# Patient Record
Sex: Female | Born: 1945 | Race: White | Hispanic: No | Smoking: Former smoker
Health system: Southern US, Community
[De-identification: ages and names within clinical notes are randomized; demographics above are authoritative.]

## PROBLEM LIST (undated history)

## (undated) DIAGNOSIS — E041 Nontoxic single thyroid nodule: Secondary | ICD-10-CM

## (undated) DIAGNOSIS — M3500 Sicca syndrome, unspecified: Secondary | ICD-10-CM

## (undated) DIAGNOSIS — Z8619 Personal history of other infectious and parasitic diseases: Secondary | ICD-10-CM

## (undated) DIAGNOSIS — H409 Unspecified glaucoma: Secondary | ICD-10-CM

## (undated) DIAGNOSIS — M5430 Sciatica, unspecified side: Secondary | ICD-10-CM

## (undated) DIAGNOSIS — K449 Diaphragmatic hernia without obstruction or gangrene: Secondary | ICD-10-CM

## (undated) DIAGNOSIS — C029 Malignant neoplasm of tongue, unspecified: Secondary | ICD-10-CM

## (undated) DIAGNOSIS — K219 Gastro-esophageal reflux disease without esophagitis: Secondary | ICD-10-CM

## (undated) DIAGNOSIS — Z8709 Personal history of other diseases of the respiratory system: Secondary | ICD-10-CM

## (undated) DIAGNOSIS — F319 Bipolar disorder, unspecified: Secondary | ICD-10-CM

## (undated) DIAGNOSIS — M1990 Unspecified osteoarthritis, unspecified site: Secondary | ICD-10-CM

## (undated) DIAGNOSIS — M47812 Spondylosis without myelopathy or radiculopathy, cervical region: Secondary | ICD-10-CM

## (undated) DIAGNOSIS — R209 Unspecified disturbances of skin sensation: Secondary | ICD-10-CM

## (undated) DIAGNOSIS — H903 Sensorineural hearing loss, bilateral: Secondary | ICD-10-CM

## (undated) HISTORY — DX: Sensorineural hearing loss, bilateral: H90.3

## (undated) HISTORY — DX: Spondylosis without myelopathy or radiculopathy, cervical region: M47.812

## (undated) HISTORY — DX: Malignant neoplasm of tongue, unspecified: C02.9

## (undated) HISTORY — DX: Unspecified disturbances of skin sensation: R20.9

## (undated) HISTORY — PX: CATARACT EXTRACTION: SUR2

## (undated) HISTORY — DX: Unspecified osteoarthritis, unspecified site: M19.90

## (undated) HISTORY — DX: Personal history of other infectious and parasitic diseases: Z86.19

## (undated) HISTORY — DX: Diaphragmatic hernia without obstruction or gangrene: K44.9

## (undated) HISTORY — PX: OTHER SURGICAL HISTORY: SHX169

## (undated) HISTORY — PX: CHOLECYSTECTOMY: SHX55

## (undated) HISTORY — PX: ABDOMINAL HYSTERECTOMY: SUR658

## (undated) HISTORY — DX: Sicca syndrome, unspecified: M35.00

## (undated) HISTORY — DX: Personal history of other diseases of the respiratory system: Z87.09

## (undated) HISTORY — DX: Unspecified glaucoma: H40.9

## (undated) HISTORY — DX: Sciatica, unspecified side: M54.30

## (undated) HISTORY — PX: BREAST BIOPSY: SHX20

## (undated) HISTORY — DX: Nontoxic single thyroid nodule: E04.1

## (undated) HISTORY — DX: Bipolar disorder, unspecified: F31.9

## (undated) HISTORY — DX: Gastro-esophageal reflux disease without esophagitis: K21.9

---
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
## 1979-10-29 HISTORY — PX: TUBAL LIGATION: SHX77

## 2007-09-02 DIAGNOSIS — M3500 Sicca syndrome, unspecified: Secondary | ICD-10-CM

## 2007-09-02 HISTORY — DX: Sjogren syndrome, unspecified: M35.00

## 2011-08-15 HISTORY — PX: KNEE SURGERY: SHX244

## 2011-10-30 DIAGNOSIS — F331 Major depressive disorder, recurrent, moderate: Secondary | ICD-10-CM | POA: Diagnosis not present

## 2011-11-19 DIAGNOSIS — R0982 Postnasal drip: Secondary | ICD-10-CM | POA: Diagnosis not present

## 2011-11-19 DIAGNOSIS — R0602 Shortness of breath: Secondary | ICD-10-CM | POA: Diagnosis not present

## 2011-11-19 DIAGNOSIS — G4733 Obstructive sleep apnea (adult) (pediatric): Secondary | ICD-10-CM | POA: Diagnosis not present

## 2011-11-26 DIAGNOSIS — M25569 Pain in unspecified knee: Secondary | ICD-10-CM | POA: Diagnosis not present

## 2011-11-27 DIAGNOSIS — F331 Major depressive disorder, recurrent, moderate: Secondary | ICD-10-CM | POA: Diagnosis not present

## 2011-12-04 DIAGNOSIS — G4733 Obstructive sleep apnea (adult) (pediatric): Secondary | ICD-10-CM | POA: Diagnosis not present

## 2011-12-25 DIAGNOSIS — F331 Major depressive disorder, recurrent, moderate: Secondary | ICD-10-CM | POA: Diagnosis not present

## 2012-01-15 DIAGNOSIS — H26499 Other secondary cataract, unspecified eye: Secondary | ICD-10-CM | POA: Diagnosis not present

## 2012-01-15 DIAGNOSIS — Z961 Presence of intraocular lens: Secondary | ICD-10-CM | POA: Diagnosis not present

## 2012-01-15 DIAGNOSIS — H4011X0 Primary open-angle glaucoma, stage unspecified: Secondary | ICD-10-CM | POA: Diagnosis not present

## 2012-01-15 DIAGNOSIS — H47239 Glaucomatous optic atrophy, unspecified eye: Secondary | ICD-10-CM | POA: Diagnosis not present

## 2012-01-21 DIAGNOSIS — G4733 Obstructive sleep apnea (adult) (pediatric): Secondary | ICD-10-CM | POA: Diagnosis not present

## 2012-01-21 DIAGNOSIS — J449 Chronic obstructive pulmonary disease, unspecified: Secondary | ICD-10-CM | POA: Diagnosis not present

## 2012-01-21 DIAGNOSIS — R0602 Shortness of breath: Secondary | ICD-10-CM | POA: Diagnosis not present

## 2012-01-22 DIAGNOSIS — F331 Major depressive disorder, recurrent, moderate: Secondary | ICD-10-CM | POA: Diagnosis not present

## 2012-01-31 DIAGNOSIS — M810 Age-related osteoporosis without current pathological fracture: Secondary | ICD-10-CM | POA: Diagnosis not present

## 2012-01-31 DIAGNOSIS — Z1231 Encounter for screening mammogram for malignant neoplasm of breast: Secondary | ICD-10-CM | POA: Diagnosis not present

## 2012-01-31 DIAGNOSIS — Z0000 Encounter for general adult medical examination without abnormal findings: Secondary | ICD-10-CM | POA: Diagnosis not present

## 2012-02-19 DIAGNOSIS — G4733 Obstructive sleep apnea (adult) (pediatric): Secondary | ICD-10-CM | POA: Diagnosis not present

## 2012-02-19 DIAGNOSIS — R0602 Shortness of breath: Secondary | ICD-10-CM | POA: Diagnosis not present

## 2012-02-19 DIAGNOSIS — F331 Major depressive disorder, recurrent, moderate: Secondary | ICD-10-CM | POA: Diagnosis not present

## 2012-02-19 DIAGNOSIS — J449 Chronic obstructive pulmonary disease, unspecified: Secondary | ICD-10-CM | POA: Diagnosis not present

## 2012-03-17 DIAGNOSIS — F331 Major depressive disorder, recurrent, moderate: Secondary | ICD-10-CM | POA: Diagnosis not present

## 2012-05-26 DIAGNOSIS — F331 Major depressive disorder, recurrent, moderate: Secondary | ICD-10-CM | POA: Diagnosis not present

## 2012-05-26 DIAGNOSIS — R0602 Shortness of breath: Secondary | ICD-10-CM | POA: Diagnosis not present

## 2012-05-26 DIAGNOSIS — G4733 Obstructive sleep apnea (adult) (pediatric): Secondary | ICD-10-CM | POA: Diagnosis not present

## 2012-05-26 DIAGNOSIS — J449 Chronic obstructive pulmonary disease, unspecified: Secondary | ICD-10-CM | POA: Diagnosis not present

## 2012-06-03 DIAGNOSIS — J449 Chronic obstructive pulmonary disease, unspecified: Secondary | ICD-10-CM | POA: Diagnosis not present

## 2012-06-03 DIAGNOSIS — M65849 Other synovitis and tenosynovitis, unspecified hand: Secondary | ICD-10-CM | POA: Diagnosis not present

## 2012-06-03 DIAGNOSIS — M65839 Other synovitis and tenosynovitis, unspecified forearm: Secondary | ICD-10-CM | POA: Diagnosis not present

## 2012-06-03 DIAGNOSIS — M159 Polyosteoarthritis, unspecified: Secondary | ICD-10-CM | POA: Diagnosis not present

## 2012-06-03 DIAGNOSIS — G4733 Obstructive sleep apnea (adult) (pediatric): Secondary | ICD-10-CM | POA: Diagnosis not present

## 2012-06-19 DIAGNOSIS — M79609 Pain in unspecified limb: Secondary | ICD-10-CM | POA: Diagnosis not present

## 2012-06-19 DIAGNOSIS — M25579 Pain in unspecified ankle and joints of unspecified foot: Secondary | ICD-10-CM | POA: Diagnosis not present

## 2012-06-22 DIAGNOSIS — M79609 Pain in unspecified limb: Secondary | ICD-10-CM | POA: Diagnosis not present

## 2012-06-23 DIAGNOSIS — F331 Major depressive disorder, recurrent, moderate: Secondary | ICD-10-CM | POA: Diagnosis not present

## 2012-06-26 DIAGNOSIS — M79609 Pain in unspecified limb: Secondary | ICD-10-CM | POA: Diagnosis not present

## 2012-06-26 DIAGNOSIS — S92309A Fracture of unspecified metatarsal bone(s), unspecified foot, initial encounter for closed fracture: Secondary | ICD-10-CM | POA: Diagnosis not present

## 2012-06-26 DIAGNOSIS — M25579 Pain in unspecified ankle and joints of unspecified foot: Secondary | ICD-10-CM | POA: Diagnosis not present

## 2012-07-06 DIAGNOSIS — F319 Bipolar disorder, unspecified: Secondary | ICD-10-CM | POA: Diagnosis not present

## 2012-07-09 DIAGNOSIS — F319 Bipolar disorder, unspecified: Secondary | ICD-10-CM | POA: Diagnosis not present

## 2012-07-13 DIAGNOSIS — F319 Bipolar disorder, unspecified: Secondary | ICD-10-CM | POA: Diagnosis not present

## 2012-07-16 DIAGNOSIS — H47239 Glaucomatous optic atrophy, unspecified eye: Secondary | ICD-10-CM | POA: Diagnosis not present

## 2012-07-16 DIAGNOSIS — H40019 Open angle with borderline findings, low risk, unspecified eye: Secondary | ICD-10-CM | POA: Diagnosis not present

## 2012-07-16 DIAGNOSIS — Z961 Presence of intraocular lens: Secondary | ICD-10-CM | POA: Diagnosis not present

## 2012-07-24 DIAGNOSIS — F319 Bipolar disorder, unspecified: Secondary | ICD-10-CM | POA: Diagnosis not present

## 2012-07-27 DIAGNOSIS — F331 Major depressive disorder, recurrent, moderate: Secondary | ICD-10-CM | POA: Diagnosis not present

## 2012-07-28 DIAGNOSIS — M25579 Pain in unspecified ankle and joints of unspecified foot: Secondary | ICD-10-CM | POA: Diagnosis not present

## 2012-07-28 DIAGNOSIS — M79609 Pain in unspecified limb: Secondary | ICD-10-CM | POA: Diagnosis not present

## 2012-07-28 DIAGNOSIS — S92309A Fracture of unspecified metatarsal bone(s), unspecified foot, initial encounter for closed fracture: Secondary | ICD-10-CM | POA: Diagnosis not present

## 2012-08-04 DIAGNOSIS — F319 Bipolar disorder, unspecified: Secondary | ICD-10-CM | POA: Diagnosis not present

## 2012-08-04 DIAGNOSIS — M940 Chondrocostal junction syndrome [Tietze]: Secondary | ICD-10-CM | POA: Diagnosis not present

## 2012-08-18 DIAGNOSIS — F319 Bipolar disorder, unspecified: Secondary | ICD-10-CM | POA: Diagnosis not present

## 2012-08-24 DIAGNOSIS — F331 Major depressive disorder, recurrent, moderate: Secondary | ICD-10-CM | POA: Diagnosis not present

## 2012-08-26 DIAGNOSIS — Z961 Presence of intraocular lens: Secondary | ICD-10-CM | POA: Diagnosis not present

## 2012-08-26 DIAGNOSIS — H47239 Glaucomatous optic atrophy, unspecified eye: Secondary | ICD-10-CM | POA: Diagnosis not present

## 2012-08-26 DIAGNOSIS — H40019 Open angle with borderline findings, low risk, unspecified eye: Secondary | ICD-10-CM | POA: Diagnosis not present

## 2012-09-04 DIAGNOSIS — F319 Bipolar disorder, unspecified: Secondary | ICD-10-CM | POA: Diagnosis not present

## 2012-09-13 DIAGNOSIS — M545 Low back pain, unspecified: Secondary | ICD-10-CM | POA: Diagnosis not present

## 2012-09-13 DIAGNOSIS — X500XXA Overexertion from strenuous movement or load, initial encounter: Secondary | ICD-10-CM | POA: Diagnosis not present

## 2012-09-13 DIAGNOSIS — F17200 Nicotine dependence, unspecified, uncomplicated: Secondary | ICD-10-CM | POA: Diagnosis not present

## 2012-09-13 DIAGNOSIS — S339XXA Sprain of unspecified parts of lumbar spine and pelvis, initial encounter: Secondary | ICD-10-CM | POA: Diagnosis not present

## 2012-09-13 DIAGNOSIS — Y92009 Unspecified place in unspecified non-institutional (private) residence as the place of occurrence of the external cause: Secondary | ICD-10-CM | POA: Diagnosis not present

## 2012-09-13 DIAGNOSIS — M549 Dorsalgia, unspecified: Secondary | ICD-10-CM | POA: Diagnosis not present

## 2012-09-13 DIAGNOSIS — M1990 Unspecified osteoarthritis, unspecified site: Secondary | ICD-10-CM | POA: Diagnosis not present

## 2012-09-14 DIAGNOSIS — M545 Low back pain, unspecified: Secondary | ICD-10-CM | POA: Diagnosis not present

## 2012-09-14 DIAGNOSIS — M5430 Sciatica, unspecified side: Secondary | ICD-10-CM | POA: Diagnosis not present

## 2012-09-18 DIAGNOSIS — F331 Major depressive disorder, recurrent, moderate: Secondary | ICD-10-CM | POA: Diagnosis not present

## 2012-09-30 DIAGNOSIS — F319 Bipolar disorder, unspecified: Secondary | ICD-10-CM | POA: Diagnosis not present

## 2012-10-07 DIAGNOSIS — M545 Low back pain, unspecified: Secondary | ICD-10-CM | POA: Diagnosis not present

## 2012-10-07 DIAGNOSIS — F319 Bipolar disorder, unspecified: Secondary | ICD-10-CM | POA: Diagnosis not present

## 2012-10-07 DIAGNOSIS — M5430 Sciatica, unspecified side: Secondary | ICD-10-CM | POA: Diagnosis not present

## 2012-10-28 DIAGNOSIS — C029 Malignant neoplasm of tongue, unspecified: Secondary | ICD-10-CM

## 2012-10-28 HISTORY — DX: Malignant neoplasm of tongue, unspecified: C02.9

## 2012-11-04 DIAGNOSIS — G4762 Sleep related leg cramps: Secondary | ICD-10-CM | POA: Diagnosis not present

## 2012-11-09 DIAGNOSIS — G4733 Obstructive sleep apnea (adult) (pediatric): Secondary | ICD-10-CM | POA: Diagnosis not present

## 2012-11-09 DIAGNOSIS — J449 Chronic obstructive pulmonary disease, unspecified: Secondary | ICD-10-CM | POA: Diagnosis not present

## 2012-11-09 DIAGNOSIS — R0602 Shortness of breath: Secondary | ICD-10-CM | POA: Diagnosis not present

## 2012-11-10 DIAGNOSIS — F319 Bipolar disorder, unspecified: Secondary | ICD-10-CM | POA: Diagnosis not present

## 2012-11-12 DIAGNOSIS — F331 Major depressive disorder, recurrent, moderate: Secondary | ICD-10-CM | POA: Diagnosis not present

## 2012-11-25 DIAGNOSIS — L299 Pruritus, unspecified: Secondary | ICD-10-CM | POA: Diagnosis not present

## 2012-11-25 DIAGNOSIS — L820 Inflamed seborrheic keratosis: Secondary | ICD-10-CM | POA: Diagnosis not present

## 2012-12-15 DIAGNOSIS — F319 Bipolar disorder, unspecified: Secondary | ICD-10-CM | POA: Diagnosis not present

## 2012-12-22 DIAGNOSIS — H903 Sensorineural hearing loss, bilateral: Secondary | ICD-10-CM | POA: Diagnosis not present

## 2012-12-22 DIAGNOSIS — J301 Allergic rhinitis due to pollen: Secondary | ICD-10-CM | POA: Diagnosis not present

## 2012-12-22 DIAGNOSIS — R51 Headache: Secondary | ICD-10-CM | POA: Diagnosis not present

## 2012-12-22 DIAGNOSIS — K2100 Gastro-esophageal reflux disease with esophagitis, without bleeding: Secondary | ICD-10-CM | POA: Diagnosis not present

## 2012-12-22 DIAGNOSIS — M542 Cervicalgia: Secondary | ICD-10-CM | POA: Diagnosis not present

## 2012-12-22 DIAGNOSIS — J329 Chronic sinusitis, unspecified: Secondary | ICD-10-CM | POA: Diagnosis not present

## 2012-12-22 DIAGNOSIS — J3489 Other specified disorders of nose and nasal sinuses: Secondary | ICD-10-CM | POA: Diagnosis not present

## 2012-12-31 DIAGNOSIS — Z961 Presence of intraocular lens: Secondary | ICD-10-CM | POA: Diagnosis not present

## 2012-12-31 DIAGNOSIS — H40019 Open angle with borderline findings, low risk, unspecified eye: Secondary | ICD-10-CM | POA: Diagnosis not present

## 2012-12-31 DIAGNOSIS — H47239 Glaucomatous optic atrophy, unspecified eye: Secondary | ICD-10-CM | POA: Diagnosis not present

## 2012-12-31 DIAGNOSIS — M316 Other giant cell arteritis: Secondary | ICD-10-CM | POA: Diagnosis not present

## 2013-01-05 DIAGNOSIS — H903 Sensorineural hearing loss, bilateral: Secondary | ICD-10-CM | POA: Diagnosis not present

## 2013-01-06 DIAGNOSIS — F331 Major depressive disorder, recurrent, moderate: Secondary | ICD-10-CM | POA: Diagnosis not present

## 2013-01-08 DIAGNOSIS — G44229 Chronic tension-type headache, not intractable: Secondary | ICD-10-CM | POA: Diagnosis not present

## 2013-01-08 DIAGNOSIS — R0989 Other specified symptoms and signs involving the circulatory and respiratory systems: Secondary | ICD-10-CM | POA: Diagnosis not present

## 2013-01-08 DIAGNOSIS — H9209 Otalgia, unspecified ear: Secondary | ICD-10-CM | POA: Diagnosis not present

## 2013-01-14 DIAGNOSIS — F411 Generalized anxiety disorder: Secondary | ICD-10-CM | POA: Diagnosis not present

## 2013-01-15 DIAGNOSIS — F319 Bipolar disorder, unspecified: Secondary | ICD-10-CM | POA: Diagnosis not present

## 2013-01-19 DIAGNOSIS — J339 Nasal polyp, unspecified: Secondary | ICD-10-CM | POA: Diagnosis not present

## 2013-01-19 DIAGNOSIS — R51 Headache: Secondary | ICD-10-CM | POA: Diagnosis not present

## 2013-01-19 DIAGNOSIS — M542 Cervicalgia: Secondary | ICD-10-CM | POA: Diagnosis not present

## 2013-01-19 DIAGNOSIS — K2100 Gastro-esophageal reflux disease with esophagitis, without bleeding: Secondary | ICD-10-CM | POA: Diagnosis not present

## 2013-01-19 DIAGNOSIS — J301 Allergic rhinitis due to pollen: Secondary | ICD-10-CM | POA: Diagnosis not present

## 2013-01-21 DIAGNOSIS — M5412 Radiculopathy, cervical region: Secondary | ICD-10-CM | POA: Diagnosis not present

## 2013-01-21 DIAGNOSIS — R42 Dizziness and giddiness: Secondary | ICD-10-CM | POA: Diagnosis not present

## 2013-01-22 DIAGNOSIS — Z01810 Encounter for preprocedural cardiovascular examination: Secondary | ICD-10-CM | POA: Diagnosis not present

## 2013-01-22 DIAGNOSIS — Z01818 Encounter for other preprocedural examination: Secondary | ICD-10-CM | POA: Diagnosis not present

## 2013-01-25 DIAGNOSIS — Z01811 Encounter for preprocedural respiratory examination: Secondary | ICD-10-CM | POA: Diagnosis not present

## 2013-01-25 DIAGNOSIS — Z01810 Encounter for preprocedural cardiovascular examination: Secondary | ICD-10-CM | POA: Diagnosis not present

## 2013-01-25 DIAGNOSIS — R42 Dizziness and giddiness: Secondary | ICD-10-CM | POA: Diagnosis not present

## 2013-01-25 DIAGNOSIS — R51 Headache: Secondary | ICD-10-CM | POA: Diagnosis not present

## 2013-01-27 DIAGNOSIS — Z01810 Encounter for preprocedural cardiovascular examination: Secondary | ICD-10-CM | POA: Diagnosis not present

## 2013-01-27 DIAGNOSIS — R9431 Abnormal electrocardiogram [ECG] [EKG]: Secondary | ICD-10-CM | POA: Diagnosis not present

## 2013-01-27 DIAGNOSIS — J449 Chronic obstructive pulmonary disease, unspecified: Secondary | ICD-10-CM | POA: Diagnosis not present

## 2013-01-28 DIAGNOSIS — D140 Benign neoplasm of middle ear, nasal cavity and accessory sinuses: Secondary | ICD-10-CM | POA: Diagnosis not present

## 2013-01-28 DIAGNOSIS — G4730 Sleep apnea, unspecified: Secondary | ICD-10-CM | POA: Diagnosis not present

## 2013-01-28 DIAGNOSIS — C01 Malignant neoplasm of base of tongue: Secondary | ICD-10-CM | POA: Diagnosis not present

## 2013-01-28 DIAGNOSIS — J309 Allergic rhinitis, unspecified: Secondary | ICD-10-CM | POA: Diagnosis not present

## 2013-01-28 DIAGNOSIS — D3701 Neoplasm of uncertain behavior of lip: Secondary | ICD-10-CM | POA: Diagnosis not present

## 2013-01-28 DIAGNOSIS — F319 Bipolar disorder, unspecified: Secondary | ICD-10-CM | POA: Diagnosis not present

## 2013-01-28 DIAGNOSIS — D386 Neoplasm of uncertain behavior of respiratory organ, unspecified: Secondary | ICD-10-CM | POA: Diagnosis not present

## 2013-01-28 DIAGNOSIS — F17200 Nicotine dependence, unspecified, uncomplicated: Secondary | ICD-10-CM | POA: Diagnosis not present

## 2013-01-28 DIAGNOSIS — J449 Chronic obstructive pulmonary disease, unspecified: Secondary | ICD-10-CM | POA: Diagnosis not present

## 2013-01-28 DIAGNOSIS — M1990 Unspecified osteoarthritis, unspecified site: Secondary | ICD-10-CM | POA: Diagnosis not present

## 2013-01-28 DIAGNOSIS — C029 Malignant neoplasm of tongue, unspecified: Secondary | ICD-10-CM | POA: Diagnosis not present

## 2013-01-28 DIAGNOSIS — K219 Gastro-esophageal reflux disease without esophagitis: Secondary | ICD-10-CM | POA: Diagnosis not present

## 2013-01-28 DIAGNOSIS — E041 Nontoxic single thyroid nodule: Secondary | ICD-10-CM | POA: Diagnosis not present

## 2013-01-28 DIAGNOSIS — Z79899 Other long term (current) drug therapy: Secondary | ICD-10-CM | POA: Diagnosis not present

## 2013-01-28 DIAGNOSIS — D3709 Neoplasm of uncertain behavior of other specified sites of the oral cavity: Secondary | ICD-10-CM | POA: Diagnosis not present

## 2013-01-28 DIAGNOSIS — J339 Nasal polyp, unspecified: Secondary | ICD-10-CM | POA: Diagnosis not present

## 2013-02-01 DIAGNOSIS — M5412 Radiculopathy, cervical region: Secondary | ICD-10-CM | POA: Diagnosis not present

## 2013-02-02 DIAGNOSIS — C029 Malignant neoplasm of tongue, unspecified: Secondary | ICD-10-CM

## 2013-02-02 HISTORY — DX: Malignant neoplasm of tongue, unspecified: C02.9

## 2013-02-04 DIAGNOSIS — C01 Malignant neoplasm of base of tongue: Secondary | ICD-10-CM | POA: Diagnosis not present

## 2013-02-05 DIAGNOSIS — K2100 Gastro-esophageal reflux disease with esophagitis, without bleeding: Secondary | ICD-10-CM | POA: Diagnosis not present

## 2013-02-05 DIAGNOSIS — R51 Headache: Secondary | ICD-10-CM | POA: Diagnosis not present

## 2013-02-05 DIAGNOSIS — E041 Nontoxic single thyroid nodule: Secondary | ICD-10-CM | POA: Diagnosis not present

## 2013-02-05 DIAGNOSIS — J339 Nasal polyp, unspecified: Secondary | ICD-10-CM | POA: Diagnosis not present

## 2013-02-05 DIAGNOSIS — J301 Allergic rhinitis due to pollen: Secondary | ICD-10-CM | POA: Diagnosis not present

## 2013-02-05 DIAGNOSIS — C01 Malignant neoplasm of base of tongue: Secondary | ICD-10-CM | POA: Diagnosis not present

## 2013-02-05 DIAGNOSIS — M542 Cervicalgia: Secondary | ICD-10-CM | POA: Diagnosis not present

## 2013-02-11 DIAGNOSIS — E041 Nontoxic single thyroid nodule: Secondary | ICD-10-CM | POA: Diagnosis not present

## 2013-02-11 DIAGNOSIS — D34 Benign neoplasm of thyroid gland: Secondary | ICD-10-CM | POA: Diagnosis not present

## 2013-02-11 DIAGNOSIS — E042 Nontoxic multinodular goiter: Secondary | ICD-10-CM | POA: Diagnosis not present

## 2013-02-11 DIAGNOSIS — C029 Malignant neoplasm of tongue, unspecified: Secondary | ICD-10-CM | POA: Diagnosis not present

## 2013-02-17 DIAGNOSIS — Z01818 Encounter for other preprocedural examination: Secondary | ICD-10-CM | POA: Diagnosis not present

## 2013-02-17 DIAGNOSIS — C01 Malignant neoplasm of base of tongue: Secondary | ICD-10-CM | POA: Diagnosis not present

## 2013-02-18 DIAGNOSIS — C01 Malignant neoplasm of base of tongue: Secondary | ICD-10-CM | POA: Diagnosis not present

## 2013-02-22 DIAGNOSIS — C01 Malignant neoplasm of base of tongue: Secondary | ICD-10-CM | POA: Diagnosis not present

## 2013-02-22 DIAGNOSIS — R05 Cough: Secondary | ICD-10-CM | POA: Diagnosis not present

## 2013-02-22 DIAGNOSIS — R059 Cough, unspecified: Secondary | ICD-10-CM | POA: Diagnosis not present

## 2013-02-22 DIAGNOSIS — R1312 Dysphagia, oropharyngeal phase: Secondary | ICD-10-CM | POA: Diagnosis not present

## 2013-02-22 DIAGNOSIS — B977 Papillomavirus as the cause of diseases classified elsewhere: Secondary | ICD-10-CM | POA: Diagnosis not present

## 2013-02-25 DIAGNOSIS — C01 Malignant neoplasm of base of tongue: Secondary | ICD-10-CM | POA: Diagnosis not present

## 2013-03-03 DIAGNOSIS — E042 Nontoxic multinodular goiter: Secondary | ICD-10-CM | POA: Diagnosis not present

## 2013-03-03 DIAGNOSIS — D140 Benign neoplasm of middle ear, nasal cavity and accessory sinuses: Secondary | ICD-10-CM | POA: Diagnosis not present

## 2013-03-03 DIAGNOSIS — B977 Papillomavirus as the cause of diseases classified elsewhere: Secondary | ICD-10-CM | POA: Diagnosis not present

## 2013-03-03 DIAGNOSIS — C01 Malignant neoplasm of base of tongue: Secondary | ICD-10-CM | POA: Diagnosis not present

## 2013-03-04 DIAGNOSIS — M7989 Other specified soft tissue disorders: Secondary | ICD-10-CM | POA: Diagnosis not present

## 2013-03-04 DIAGNOSIS — C01 Malignant neoplasm of base of tongue: Secondary | ICD-10-CM | POA: Diagnosis not present

## 2013-03-10 DIAGNOSIS — C01 Malignant neoplasm of base of tongue: Secondary | ICD-10-CM | POA: Diagnosis not present

## 2013-03-15 DIAGNOSIS — C01 Malignant neoplasm of base of tongue: Secondary | ICD-10-CM | POA: Diagnosis not present

## 2013-03-15 DIAGNOSIS — R1312 Dysphagia, oropharyngeal phase: Secondary | ICD-10-CM | POA: Diagnosis not present

## 2013-03-15 DIAGNOSIS — Z85819 Personal history of malignant neoplasm of unspecified site of lip, oral cavity, and pharynx: Secondary | ICD-10-CM | POA: Diagnosis not present

## 2013-03-18 DIAGNOSIS — C01 Malignant neoplasm of base of tongue: Secondary | ICD-10-CM | POA: Diagnosis not present

## 2013-03-24 DIAGNOSIS — C01 Malignant neoplasm of base of tongue: Secondary | ICD-10-CM | POA: Diagnosis not present

## 2013-03-25 DIAGNOSIS — C01 Malignant neoplasm of base of tongue: Secondary | ICD-10-CM | POA: Diagnosis not present

## 2013-03-25 DIAGNOSIS — R112 Nausea with vomiting, unspecified: Secondary | ICD-10-CM | POA: Diagnosis not present

## 2013-03-26 DIAGNOSIS — C01 Malignant neoplasm of base of tongue: Secondary | ICD-10-CM | POA: Diagnosis not present

## 2013-03-29 DIAGNOSIS — C01 Malignant neoplasm of base of tongue: Secondary | ICD-10-CM | POA: Diagnosis not present

## 2013-03-30 DIAGNOSIS — C01 Malignant neoplasm of base of tongue: Secondary | ICD-10-CM | POA: Diagnosis not present

## 2013-03-31 DIAGNOSIS — C01 Malignant neoplasm of base of tongue: Secondary | ICD-10-CM | POA: Diagnosis not present

## 2013-04-01 DIAGNOSIS — C760 Malignant neoplasm of head, face and neck: Secondary | ICD-10-CM | POA: Diagnosis not present

## 2013-04-01 DIAGNOSIS — C01 Malignant neoplasm of base of tongue: Secondary | ICD-10-CM | POA: Diagnosis not present

## 2013-04-02 DIAGNOSIS — C01 Malignant neoplasm of base of tongue: Secondary | ICD-10-CM | POA: Diagnosis not present

## 2013-04-05 DIAGNOSIS — C01 Malignant neoplasm of base of tongue: Secondary | ICD-10-CM | POA: Diagnosis not present

## 2013-04-06 DIAGNOSIS — C01 Malignant neoplasm of base of tongue: Secondary | ICD-10-CM | POA: Diagnosis not present

## 2013-04-07 DIAGNOSIS — C01 Malignant neoplasm of base of tongue: Secondary | ICD-10-CM | POA: Diagnosis not present

## 2013-04-08 DIAGNOSIS — C01 Malignant neoplasm of base of tongue: Secondary | ICD-10-CM | POA: Diagnosis not present

## 2013-04-08 DIAGNOSIS — F319 Bipolar disorder, unspecified: Secondary | ICD-10-CM | POA: Diagnosis not present

## 2013-04-08 DIAGNOSIS — C760 Malignant neoplasm of head, face and neck: Secondary | ICD-10-CM | POA: Diagnosis not present

## 2013-04-09 DIAGNOSIS — C01 Malignant neoplasm of base of tongue: Secondary | ICD-10-CM | POA: Diagnosis not present

## 2013-04-12 DIAGNOSIS — C01 Malignant neoplasm of base of tongue: Secondary | ICD-10-CM | POA: Diagnosis not present

## 2013-04-13 DIAGNOSIS — C01 Malignant neoplasm of base of tongue: Secondary | ICD-10-CM | POA: Diagnosis not present

## 2013-04-14 DIAGNOSIS — C01 Malignant neoplasm of base of tongue: Secondary | ICD-10-CM | POA: Diagnosis not present

## 2013-04-15 DIAGNOSIS — C760 Malignant neoplasm of head, face and neck: Secondary | ICD-10-CM | POA: Diagnosis not present

## 2013-04-15 DIAGNOSIS — C01 Malignant neoplasm of base of tongue: Secondary | ICD-10-CM | POA: Diagnosis not present

## 2013-04-16 DIAGNOSIS — C01 Malignant neoplasm of base of tongue: Secondary | ICD-10-CM | POA: Diagnosis not present

## 2013-04-19 DIAGNOSIS — C01 Malignant neoplasm of base of tongue: Secondary | ICD-10-CM | POA: Diagnosis not present

## 2013-04-19 DIAGNOSIS — M5030 Other cervical disc degeneration, unspecified cervical region: Secondary | ICD-10-CM | POA: Diagnosis not present

## 2013-04-20 DIAGNOSIS — C01 Malignant neoplasm of base of tongue: Secondary | ICD-10-CM | POA: Diagnosis not present

## 2013-04-21 DIAGNOSIS — C01 Malignant neoplasm of base of tongue: Secondary | ICD-10-CM | POA: Diagnosis not present

## 2013-04-22 DIAGNOSIS — Z79899 Other long term (current) drug therapy: Secondary | ICD-10-CM | POA: Diagnosis not present

## 2013-04-22 DIAGNOSIS — R293 Abnormal posture: Secondary | ICD-10-CM | POA: Diagnosis not present

## 2013-04-22 DIAGNOSIS — IMO0001 Reserved for inherently not codable concepts without codable children: Secondary | ICD-10-CM | POA: Diagnosis not present

## 2013-04-22 DIAGNOSIS — C760 Malignant neoplasm of head, face and neck: Secondary | ICD-10-CM | POA: Diagnosis not present

## 2013-04-22 DIAGNOSIS — M542 Cervicalgia: Secondary | ICD-10-CM | POA: Diagnosis not present

## 2013-04-22 DIAGNOSIS — C01 Malignant neoplasm of base of tongue: Secondary | ICD-10-CM | POA: Diagnosis not present

## 2013-04-22 DIAGNOSIS — M5412 Radiculopathy, cervical region: Secondary | ICD-10-CM | POA: Diagnosis not present

## 2013-04-22 DIAGNOSIS — M25519 Pain in unspecified shoulder: Secondary | ICD-10-CM | POA: Diagnosis not present

## 2013-04-22 DIAGNOSIS — Z510 Encounter for antineoplastic radiation therapy: Secondary | ICD-10-CM | POA: Diagnosis not present

## 2013-04-22 DIAGNOSIS — R197 Diarrhea, unspecified: Secondary | ICD-10-CM | POA: Diagnosis not present

## 2013-04-23 DIAGNOSIS — C01 Malignant neoplasm of base of tongue: Secondary | ICD-10-CM | POA: Diagnosis not present

## 2013-04-26 DIAGNOSIS — C01 Malignant neoplasm of base of tongue: Secondary | ICD-10-CM | POA: Diagnosis not present

## 2013-04-26 DIAGNOSIS — R197 Diarrhea, unspecified: Secondary | ICD-10-CM | POA: Diagnosis not present

## 2013-04-27 DIAGNOSIS — M25519 Pain in unspecified shoulder: Secondary | ICD-10-CM | POA: Diagnosis not present

## 2013-04-27 DIAGNOSIS — IMO0001 Reserved for inherently not codable concepts without codable children: Secondary | ICD-10-CM | POA: Diagnosis not present

## 2013-04-27 DIAGNOSIS — R293 Abnormal posture: Secondary | ICD-10-CM | POA: Diagnosis not present

## 2013-04-27 DIAGNOSIS — C01 Malignant neoplasm of base of tongue: Secondary | ICD-10-CM | POA: Diagnosis not present

## 2013-04-27 DIAGNOSIS — M5412 Radiculopathy, cervical region: Secondary | ICD-10-CM | POA: Diagnosis not present

## 2013-04-27 DIAGNOSIS — Z79899 Other long term (current) drug therapy: Secondary | ICD-10-CM | POA: Diagnosis not present

## 2013-04-27 DIAGNOSIS — M542 Cervicalgia: Secondary | ICD-10-CM | POA: Diagnosis not present

## 2013-04-27 DIAGNOSIS — Z510 Encounter for antineoplastic radiation therapy: Secondary | ICD-10-CM | POA: Diagnosis not present

## 2013-04-28 DIAGNOSIS — C01 Malignant neoplasm of base of tongue: Secondary | ICD-10-CM | POA: Diagnosis not present

## 2013-04-29 DIAGNOSIS — C01 Malignant neoplasm of base of tongue: Secondary | ICD-10-CM | POA: Diagnosis not present

## 2013-05-03 DIAGNOSIS — C01 Malignant neoplasm of base of tongue: Secondary | ICD-10-CM | POA: Diagnosis not present

## 2013-05-03 DIAGNOSIS — C3490 Malignant neoplasm of unspecified part of unspecified bronchus or lung: Secondary | ICD-10-CM | POA: Diagnosis not present

## 2013-05-04 DIAGNOSIS — C01 Malignant neoplasm of base of tongue: Secondary | ICD-10-CM | POA: Diagnosis not present

## 2013-05-05 DIAGNOSIS — C01 Malignant neoplasm of base of tongue: Secondary | ICD-10-CM | POA: Diagnosis not present

## 2013-05-06 DIAGNOSIS — C01 Malignant neoplasm of base of tongue: Secondary | ICD-10-CM | POA: Diagnosis not present

## 2013-05-07 DIAGNOSIS — Z79899 Other long term (current) drug therapy: Secondary | ICD-10-CM | POA: Diagnosis not present

## 2013-05-07 DIAGNOSIS — R293 Abnormal posture: Secondary | ICD-10-CM | POA: Diagnosis not present

## 2013-05-07 DIAGNOSIS — M542 Cervicalgia: Secondary | ICD-10-CM | POA: Diagnosis not present

## 2013-05-07 DIAGNOSIS — M25519 Pain in unspecified shoulder: Secondary | ICD-10-CM | POA: Diagnosis not present

## 2013-05-07 DIAGNOSIS — C01 Malignant neoplasm of base of tongue: Secondary | ICD-10-CM | POA: Diagnosis not present

## 2013-05-07 DIAGNOSIS — M5412 Radiculopathy, cervical region: Secondary | ICD-10-CM | POA: Diagnosis not present

## 2013-05-10 DIAGNOSIS — C01 Malignant neoplasm of base of tongue: Secondary | ICD-10-CM | POA: Diagnosis not present

## 2013-05-11 DIAGNOSIS — C01 Malignant neoplasm of base of tongue: Secondary | ICD-10-CM | POA: Diagnosis not present

## 2013-05-12 DIAGNOSIS — M25519 Pain in unspecified shoulder: Secondary | ICD-10-CM | POA: Diagnosis not present

## 2013-05-12 DIAGNOSIS — Z79899 Other long term (current) drug therapy: Secondary | ICD-10-CM | POA: Diagnosis not present

## 2013-05-12 DIAGNOSIS — M542 Cervicalgia: Secondary | ICD-10-CM | POA: Diagnosis not present

## 2013-05-12 DIAGNOSIS — R293 Abnormal posture: Secondary | ICD-10-CM | POA: Diagnosis not present

## 2013-05-12 DIAGNOSIS — M5412 Radiculopathy, cervical region: Secondary | ICD-10-CM | POA: Diagnosis not present

## 2013-05-12 DIAGNOSIS — C01 Malignant neoplasm of base of tongue: Secondary | ICD-10-CM | POA: Diagnosis not present

## 2013-05-13 DIAGNOSIS — C01 Malignant neoplasm of base of tongue: Secondary | ICD-10-CM | POA: Diagnosis not present

## 2013-05-19 DIAGNOSIS — C01 Malignant neoplasm of base of tongue: Secondary | ICD-10-CM | POA: Diagnosis not present

## 2013-06-03 DIAGNOSIS — F331 Major depressive disorder, recurrent, moderate: Secondary | ICD-10-CM | POA: Diagnosis not present

## 2013-06-15 DIAGNOSIS — E041 Nontoxic single thyroid nodule: Secondary | ICD-10-CM | POA: Diagnosis not present

## 2013-06-15 DIAGNOSIS — K2100 Gastro-esophageal reflux disease with esophagitis, without bleeding: Secondary | ICD-10-CM | POA: Diagnosis not present

## 2013-06-15 DIAGNOSIS — M542 Cervicalgia: Secondary | ICD-10-CM | POA: Diagnosis not present

## 2013-06-15 DIAGNOSIS — J301 Allergic rhinitis due to pollen: Secondary | ICD-10-CM | POA: Diagnosis not present

## 2013-06-15 DIAGNOSIS — R51 Headache: Secondary | ICD-10-CM | POA: Diagnosis not present

## 2013-06-15 DIAGNOSIS — C01 Malignant neoplasm of base of tongue: Secondary | ICD-10-CM | POA: Diagnosis not present

## 2013-06-15 DIAGNOSIS — J339 Nasal polyp, unspecified: Secondary | ICD-10-CM | POA: Diagnosis not present

## 2013-06-30 DIAGNOSIS — F329 Major depressive disorder, single episode, unspecified: Secondary | ICD-10-CM | POA: Diagnosis not present

## 2013-06-30 DIAGNOSIS — R1313 Dysphagia, pharyngeal phase: Secondary | ICD-10-CM | POA: Diagnosis not present

## 2013-06-30 DIAGNOSIS — J309 Allergic rhinitis, unspecified: Secondary | ICD-10-CM | POA: Diagnosis not present

## 2013-07-08 DIAGNOSIS — F331 Major depressive disorder, recurrent, moderate: Secondary | ICD-10-CM | POA: Diagnosis not present

## 2013-07-13 DIAGNOSIS — M542 Cervicalgia: Secondary | ICD-10-CM | POA: Diagnosis not present

## 2013-07-13 DIAGNOSIS — K2100 Gastro-esophageal reflux disease with esophagitis, without bleeding: Secondary | ICD-10-CM | POA: Diagnosis not present

## 2013-07-13 DIAGNOSIS — R51 Headache: Secondary | ICD-10-CM | POA: Diagnosis not present

## 2013-07-13 DIAGNOSIS — C01 Malignant neoplasm of base of tongue: Secondary | ICD-10-CM | POA: Diagnosis not present

## 2013-07-13 DIAGNOSIS — J301 Allergic rhinitis due to pollen: Secondary | ICD-10-CM | POA: Diagnosis not present

## 2013-07-13 DIAGNOSIS — E041 Nontoxic single thyroid nodule: Secondary | ICD-10-CM | POA: Diagnosis not present

## 2013-07-13 DIAGNOSIS — J339 Nasal polyp, unspecified: Secondary | ICD-10-CM | POA: Diagnosis not present

## 2013-08-02 DIAGNOSIS — F331 Major depressive disorder, recurrent, moderate: Secondary | ICD-10-CM | POA: Diagnosis not present

## 2013-08-06 DIAGNOSIS — C01 Malignant neoplasm of base of tongue: Secondary | ICD-10-CM | POA: Diagnosis not present

## 2013-08-09 DIAGNOSIS — N950 Postmenopausal bleeding: Secondary | ICD-10-CM | POA: Diagnosis not present

## 2013-08-09 DIAGNOSIS — Z1212 Encounter for screening for malignant neoplasm of rectum: Secondary | ICD-10-CM | POA: Diagnosis not present

## 2013-08-09 DIAGNOSIS — N952 Postmenopausal atrophic vaginitis: Secondary | ICD-10-CM | POA: Diagnosis not present

## 2013-08-09 DIAGNOSIS — Z9189 Other specified personal risk factors, not elsewhere classified: Secondary | ICD-10-CM | POA: Diagnosis not present

## 2013-08-09 DIAGNOSIS — Z1272 Encounter for screening for malignant neoplasm of vagina: Secondary | ICD-10-CM | POA: Diagnosis not present

## 2013-08-09 DIAGNOSIS — N63 Unspecified lump in unspecified breast: Secondary | ICD-10-CM | POA: Diagnosis not present

## 2013-08-11 DIAGNOSIS — K2100 Gastro-esophageal reflux disease with esophagitis, without bleeding: Secondary | ICD-10-CM | POA: Diagnosis not present

## 2013-08-11 DIAGNOSIS — J339 Nasal polyp, unspecified: Secondary | ICD-10-CM | POA: Diagnosis not present

## 2013-08-11 DIAGNOSIS — C01 Malignant neoplasm of base of tongue: Secondary | ICD-10-CM | POA: Diagnosis not present

## 2013-08-11 DIAGNOSIS — M542 Cervicalgia: Secondary | ICD-10-CM | POA: Diagnosis not present

## 2013-08-11 DIAGNOSIS — R51 Headache: Secondary | ICD-10-CM | POA: Diagnosis not present

## 2013-08-11 DIAGNOSIS — E041 Nontoxic single thyroid nodule: Secondary | ICD-10-CM | POA: Diagnosis not present

## 2013-08-11 DIAGNOSIS — N949 Unspecified condition associated with female genital organs and menstrual cycle: Secondary | ICD-10-CM | POA: Diagnosis not present

## 2013-08-11 DIAGNOSIS — J301 Allergic rhinitis due to pollen: Secondary | ICD-10-CM | POA: Diagnosis not present

## 2013-08-13 DIAGNOSIS — N63 Unspecified lump in unspecified breast: Secondary | ICD-10-CM | POA: Diagnosis not present

## 2013-08-13 DIAGNOSIS — R928 Other abnormal and inconclusive findings on diagnostic imaging of breast: Secondary | ICD-10-CM | POA: Diagnosis not present

## 2013-08-13 DIAGNOSIS — N6049 Mammary duct ectasia of unspecified breast: Secondary | ICD-10-CM | POA: Diagnosis not present

## 2013-09-02 DIAGNOSIS — K117 Disturbances of salivary secretion: Secondary | ICD-10-CM | POA: Diagnosis not present

## 2013-09-02 DIAGNOSIS — R439 Unspecified disturbances of smell and taste: Secondary | ICD-10-CM | POA: Diagnosis not present

## 2013-09-02 DIAGNOSIS — R1312 Dysphagia, oropharyngeal phase: Secondary | ICD-10-CM | POA: Diagnosis not present

## 2013-09-02 DIAGNOSIS — C01 Malignant neoplasm of base of tongue: Secondary | ICD-10-CM | POA: Diagnosis not present

## 2013-09-06 DIAGNOSIS — C01 Malignant neoplasm of base of tongue: Secondary | ICD-10-CM | POA: Diagnosis not present

## 2013-09-07 DIAGNOSIS — Z23 Encounter for immunization: Secondary | ICD-10-CM | POA: Diagnosis not present

## 2013-09-28 DIAGNOSIS — F331 Major depressive disorder, recurrent, moderate: Secondary | ICD-10-CM | POA: Diagnosis not present

## 2013-10-06 DIAGNOSIS — J339 Nasal polyp, unspecified: Secondary | ICD-10-CM | POA: Diagnosis not present

## 2013-10-06 DIAGNOSIS — J301 Allergic rhinitis due to pollen: Secondary | ICD-10-CM | POA: Diagnosis not present

## 2013-10-06 DIAGNOSIS — C01 Malignant neoplasm of base of tongue: Secondary | ICD-10-CM | POA: Diagnosis not present

## 2013-10-06 DIAGNOSIS — R51 Headache: Secondary | ICD-10-CM | POA: Diagnosis not present

## 2013-10-06 DIAGNOSIS — K2100 Gastro-esophageal reflux disease with esophagitis, without bleeding: Secondary | ICD-10-CM | POA: Diagnosis not present

## 2013-10-06 DIAGNOSIS — M542 Cervicalgia: Secondary | ICD-10-CM | POA: Diagnosis not present

## 2013-10-06 DIAGNOSIS — E041 Nontoxic single thyroid nodule: Secondary | ICD-10-CM | POA: Diagnosis not present

## 2013-11-02 DIAGNOSIS — J310 Chronic rhinitis: Secondary | ICD-10-CM | POA: Diagnosis not present

## 2013-11-02 DIAGNOSIS — E041 Nontoxic single thyroid nodule: Secondary | ICD-10-CM | POA: Diagnosis not present

## 2013-11-02 DIAGNOSIS — M542 Cervicalgia: Secondary | ICD-10-CM | POA: Diagnosis not present

## 2013-11-02 DIAGNOSIS — K2100 Gastro-esophageal reflux disease with esophagitis, without bleeding: Secondary | ICD-10-CM | POA: Diagnosis not present

## 2013-11-02 DIAGNOSIS — J339 Nasal polyp, unspecified: Secondary | ICD-10-CM | POA: Diagnosis not present

## 2013-11-02 DIAGNOSIS — C01 Malignant neoplasm of base of tongue: Secondary | ICD-10-CM | POA: Diagnosis not present

## 2013-11-02 DIAGNOSIS — R51 Headache: Secondary | ICD-10-CM | POA: Diagnosis not present

## 2013-11-02 DIAGNOSIS — J301 Allergic rhinitis due to pollen: Secondary | ICD-10-CM | POA: Diagnosis not present

## 2013-11-16 DIAGNOSIS — F331 Major depressive disorder, recurrent, moderate: Secondary | ICD-10-CM | POA: Diagnosis not present

## 2013-12-08 DIAGNOSIS — H40019 Open angle with borderline findings, low risk, unspecified eye: Secondary | ICD-10-CM | POA: Diagnosis not present

## 2013-12-08 DIAGNOSIS — H53459 Other localized visual field defect, unspecified eye: Secondary | ICD-10-CM | POA: Diagnosis not present

## 2013-12-08 DIAGNOSIS — H409 Unspecified glaucoma: Secondary | ICD-10-CM | POA: Diagnosis not present

## 2013-12-08 DIAGNOSIS — H47239 Glaucomatous optic atrophy, unspecified eye: Secondary | ICD-10-CM | POA: Diagnosis not present

## 2013-12-14 DIAGNOSIS — K2100 Gastro-esophageal reflux disease with esophagitis, without bleeding: Secondary | ICD-10-CM | POA: Diagnosis not present

## 2013-12-14 DIAGNOSIS — E041 Nontoxic single thyroid nodule: Secondary | ICD-10-CM | POA: Diagnosis not present

## 2013-12-14 DIAGNOSIS — M542 Cervicalgia: Secondary | ICD-10-CM | POA: Diagnosis not present

## 2013-12-14 DIAGNOSIS — R51 Headache: Secondary | ICD-10-CM | POA: Diagnosis not present

## 2013-12-14 DIAGNOSIS — J310 Chronic rhinitis: Secondary | ICD-10-CM | POA: Diagnosis not present

## 2013-12-14 DIAGNOSIS — J301 Allergic rhinitis due to pollen: Secondary | ICD-10-CM | POA: Diagnosis not present

## 2013-12-14 DIAGNOSIS — J339 Nasal polyp, unspecified: Secondary | ICD-10-CM | POA: Diagnosis not present

## 2013-12-14 DIAGNOSIS — C01 Malignant neoplasm of base of tongue: Secondary | ICD-10-CM | POA: Diagnosis not present

## 2013-12-16 DIAGNOSIS — L820 Inflamed seborrheic keratosis: Secondary | ICD-10-CM | POA: Diagnosis not present

## 2013-12-16 DIAGNOSIS — D239 Other benign neoplasm of skin, unspecified: Secondary | ICD-10-CM | POA: Diagnosis not present

## 2013-12-16 DIAGNOSIS — L738 Other specified follicular disorders: Secondary | ICD-10-CM | POA: Diagnosis not present

## 2013-12-28 DIAGNOSIS — F331 Major depressive disorder, recurrent, moderate: Secondary | ICD-10-CM | POA: Diagnosis not present

## 2014-01-25 DIAGNOSIS — K2100 Gastro-esophageal reflux disease with esophagitis, without bleeding: Secondary | ICD-10-CM | POA: Diagnosis not present

## 2014-01-25 DIAGNOSIS — J310 Chronic rhinitis: Secondary | ICD-10-CM | POA: Diagnosis not present

## 2014-01-25 DIAGNOSIS — J301 Allergic rhinitis due to pollen: Secondary | ICD-10-CM | POA: Diagnosis not present

## 2014-01-25 DIAGNOSIS — R51 Headache: Secondary | ICD-10-CM | POA: Diagnosis not present

## 2014-01-25 DIAGNOSIS — J339 Nasal polyp, unspecified: Secondary | ICD-10-CM | POA: Diagnosis not present

## 2014-01-25 DIAGNOSIS — M542 Cervicalgia: Secondary | ICD-10-CM | POA: Diagnosis not present

## 2014-01-25 DIAGNOSIS — C01 Malignant neoplasm of base of tongue: Secondary | ICD-10-CM | POA: Diagnosis not present

## 2014-01-25 DIAGNOSIS — E041 Nontoxic single thyroid nodule: Secondary | ICD-10-CM | POA: Diagnosis not present

## 2014-02-04 DIAGNOSIS — C01 Malignant neoplasm of base of tongue: Secondary | ICD-10-CM | POA: Diagnosis not present

## 2014-02-07 DIAGNOSIS — C01 Malignant neoplasm of base of tongue: Secondary | ICD-10-CM | POA: Diagnosis not present

## 2014-02-07 DIAGNOSIS — E041 Nontoxic single thyroid nodule: Secondary | ICD-10-CM | POA: Diagnosis not present

## 2014-02-07 DIAGNOSIS — E042 Nontoxic multinodular goiter: Secondary | ICD-10-CM | POA: Diagnosis not present

## 2014-02-08 DIAGNOSIS — F331 Major depressive disorder, recurrent, moderate: Secondary | ICD-10-CM | POA: Diagnosis not present

## 2014-02-14 DIAGNOSIS — J339 Nasal polyp, unspecified: Secondary | ICD-10-CM | POA: Diagnosis not present

## 2014-02-14 DIAGNOSIS — C01 Malignant neoplasm of base of tongue: Secondary | ICD-10-CM | POA: Diagnosis not present

## 2014-02-14 DIAGNOSIS — E041 Nontoxic single thyroid nodule: Secondary | ICD-10-CM | POA: Diagnosis not present

## 2014-02-14 DIAGNOSIS — H903 Sensorineural hearing loss, bilateral: Secondary | ICD-10-CM | POA: Diagnosis not present

## 2014-02-14 DIAGNOSIS — M542 Cervicalgia: Secondary | ICD-10-CM | POA: Diagnosis not present

## 2014-02-14 DIAGNOSIS — H9190 Unspecified hearing loss, unspecified ear: Secondary | ICD-10-CM | POA: Diagnosis not present

## 2014-02-14 DIAGNOSIS — R51 Headache: Secondary | ICD-10-CM | POA: Diagnosis not present

## 2014-02-14 DIAGNOSIS — K2100 Gastro-esophageal reflux disease with esophagitis, without bleeding: Secondary | ICD-10-CM | POA: Diagnosis not present

## 2014-02-14 DIAGNOSIS — J301 Allergic rhinitis due to pollen: Secondary | ICD-10-CM | POA: Diagnosis not present

## 2014-03-23 DIAGNOSIS — R439 Unspecified disturbances of smell and taste: Secondary | ICD-10-CM | POA: Diagnosis not present

## 2014-03-23 DIAGNOSIS — K117 Disturbances of salivary secretion: Secondary | ICD-10-CM | POA: Diagnosis not present

## 2014-03-23 DIAGNOSIS — C01 Malignant neoplasm of base of tongue: Secondary | ICD-10-CM | POA: Diagnosis not present

## 2014-03-23 DIAGNOSIS — R1312 Dysphagia, oropharyngeal phase: Secondary | ICD-10-CM | POA: Diagnosis not present

## 2014-04-05 DIAGNOSIS — F331 Major depressive disorder, recurrent, moderate: Secondary | ICD-10-CM | POA: Diagnosis not present

## 2014-05-20 DIAGNOSIS — F332 Major depressive disorder, recurrent severe without psychotic features: Secondary | ICD-10-CM | POA: Diagnosis not present

## 2014-06-16 DIAGNOSIS — F3130 Bipolar disorder, current episode depressed, mild or moderate severity, unspecified: Secondary | ICD-10-CM | POA: Diagnosis not present

## 2014-06-29 DIAGNOSIS — K117 Disturbances of salivary secretion: Secondary | ICD-10-CM | POA: Diagnosis not present

## 2014-06-29 DIAGNOSIS — R439 Unspecified disturbances of smell and taste: Secondary | ICD-10-CM | POA: Diagnosis not present

## 2014-06-29 DIAGNOSIS — R1312 Dysphagia, oropharyngeal phase: Secondary | ICD-10-CM | POA: Diagnosis not present

## 2014-06-29 DIAGNOSIS — C01 Malignant neoplasm of base of tongue: Secondary | ICD-10-CM | POA: Diagnosis not present

## 2014-07-12 ENCOUNTER — Other Ambulatory Visit: Payer: Self-pay | Admitting: Otolaryngology

## 2014-07-12 DIAGNOSIS — C01 Malignant neoplasm of base of tongue: Secondary | ICD-10-CM

## 2014-07-12 DIAGNOSIS — R1312 Dysphagia, oropharyngeal phase: Secondary | ICD-10-CM

## 2014-07-13 ENCOUNTER — Ambulatory Visit
Admission: RE | Admit: 2014-07-13 | Discharge: 2014-07-13 | Disposition: A | Discharge status: Home / Self Care | Payer: Medicare Other | Source: Ambulatory Visit | Attending: Otolaryngology | Admitting: Otolaryngology

## 2014-07-13 DIAGNOSIS — E041 Nontoxic single thyroid nodule: Secondary | ICD-10-CM | POA: Diagnosis not present

## 2014-07-13 DIAGNOSIS — C01 Malignant neoplasm of base of tongue: Secondary | ICD-10-CM | POA: Diagnosis not present

## 2014-07-13 DIAGNOSIS — R1312 Dysphagia, oropharyngeal phase: Secondary | ICD-10-CM

## 2014-07-13 MED ORDER — GADOBENATE DIMEGLUMINE 529 MG/ML IV SOLN
17.0000 mL | Freq: Once | INTRAVENOUS | Status: AC | PRN
  Administered 2014-07-13: 17 mL via INTRAVENOUS

## 2014-07-14 DIAGNOSIS — Z8589 Personal history of malignant neoplasm of other organs and systems: Secondary | ICD-10-CM | POA: Diagnosis not present

## 2014-07-14 DIAGNOSIS — F3130 Bipolar disorder, current episode depressed, mild or moderate severity, unspecified: Secondary | ICD-10-CM | POA: Diagnosis not present

## 2014-07-14 DIAGNOSIS — J329 Chronic sinusitis, unspecified: Secondary | ICD-10-CM | POA: Diagnosis not present

## 2014-09-01 DIAGNOSIS — R200 Anesthesia of skin: Secondary | ICD-10-CM | POA: Diagnosis not present

## 2014-09-16 ENCOUNTER — Encounter: Payer: Self-pay | Admitting: *Deleted

## 2014-09-19 ENCOUNTER — Ambulatory Visit (INDEPENDENT_AMBULATORY_CARE_PROVIDER_SITE_OTHER): Payer: Medicare Other | Admitting: Neurology

## 2014-09-19 ENCOUNTER — Encounter: Payer: Self-pay | Admitting: Neurology

## 2014-09-19 ENCOUNTER — Other Ambulatory Visit: Payer: Self-pay | Admitting: Otolaryngology

## 2014-09-19 VITALS — BP 114/75 | HR 73 | Resp 14 | Ht 68.75 in | Wt 186.0 lb

## 2014-09-19 DIAGNOSIS — G4733 Obstructive sleep apnea (adult) (pediatric): Secondary | ICD-10-CM | POA: Diagnosis not present

## 2014-09-19 DIAGNOSIS — E041 Nontoxic single thyroid nodule: Secondary | ICD-10-CM

## 2014-09-19 DIAGNOSIS — R209 Unspecified disturbances of skin sensation: Secondary | ICD-10-CM

## 2014-09-19 DIAGNOSIS — M47812 Spondylosis without myelopathy or radiculopathy, cervical region: Secondary | ICD-10-CM | POA: Diagnosis not present

## 2014-09-19 DIAGNOSIS — G4736 Sleep related hypoventilation in conditions classified elsewhere: Secondary | ICD-10-CM

## 2014-09-19 DIAGNOSIS — C029 Malignant neoplasm of tongue, unspecified: Secondary | ICD-10-CM

## 2014-09-19 DIAGNOSIS — T451X5A Adverse effect of antineoplastic and immunosuppressive drugs, initial encounter: Secondary | ICD-10-CM

## 2014-09-19 DIAGNOSIS — C01 Malignant neoplasm of base of tongue: Secondary | ICD-10-CM | POA: Insufficient documentation

## 2014-09-19 DIAGNOSIS — D6481 Anemia due to antineoplastic chemotherapy: Secondary | ICD-10-CM

## 2014-09-19 DIAGNOSIS — R208 Other disturbances of skin sensation: Secondary | ICD-10-CM | POA: Diagnosis not present

## 2014-09-19 DIAGNOSIS — G620 Drug-induced polyneuropathy: Secondary | ICD-10-CM

## 2014-09-19 HISTORY — DX: Malignant neoplasm of tongue, unspecified: C02.9

## 2014-09-19 HISTORY — DX: Unspecified disturbances of skin sensation: R20.9

## 2014-09-19 HISTORY — DX: Spondylosis without myelopathy or radiculopathy, cervical region: M47.812

## 2014-09-19 MED ORDER — B COMPLEX PO TABS: ORAL_TABLET | ORAL | Status: AC

## 2014-09-19 MED ORDER — CHOLECALCIFEROL 50 MCG (2000 UT) PO CAPS: ORAL_CAPSULE | ORAL | Status: DC

## 2014-09-19 NOTE — Patient Instructions (Signed)
Take multivitamin B complex, folic acid and Vit D - you may find all of this in a prenatal vitamin.  OT for hand strength improvement.  Electromyography (EMG) Test This is a test in which very small electrodes are placed into your muscle tissue. It looks at the electrical impulses of your muscle tissue. This test is used to determine whether or not there are involuntary or spontaneous muscle movements. Involuntary or spontaneous means muscle movements that happen by themselves. This may indicate injury or disease of the nerves which supply that muscle. PREPARATION FOR TEST No preparation or fasting is necessary. Some stimulants such as caffeine and tobacco may need to be avoided for 2-3 hours before test or as instructed by your caregiver. NORMAL FINDINGS No evidence of neuromuscular abnormalities. Ranges for normal findings may vary among different laboratories and hospitals. You should always check with your doctor after having lab work or other tests done to discuss the meaning of your test results and whether your values are considered within normal limits. MEANING OF TEST  Your caregiver will go over the test results with you and discuss the importance and meaning of your results, as well as treatment options and the need for additional tests if necessary. OBTAINING THE TEST RESULTS It is your responsibility to obtain your test results. Ask the lab or department performing the test when and how you will get your results. Document Released: 02/14/2005 Document Revised: 01/06/2012 Document Reviewed: 09/23/2008 [facility] Patient Information 2015 [place], [place]. This information is not intended to replace advice given to you by your health care provider. Make sure you discuss any questions you have with your health care provider.

## 2014-09-19 NOTE — Sleep Study (Signed)
Please see the scanned sleep study interpretation located in the Procedure tab within the Chart Review section. 

## 2014-09-19 NOTE — Progress Notes (Signed)
SLEEP MEDICINE CLINIC   Provider:  Larey Seat, M D  Referring Provider: Marda Stalker, PA-C Primary Care Physician:  Marda Stalker, PA-C  Chief Complaint  Patient presents with  . NP [place]   Bil ext weakness    nerve pain wrist and legs Maily R wrist.  Numbness in legs/toes (mainly L side down)  . Rm 10 , alone    HPI:  Lori Day is a 68 y.o. caucasian , right handed female, who is seen here as a referral  from [facility] for an eva;uation of dysethesias in the right arm. She is a OSA patient  But non compliant with CPAP since June of 2014 after being diagnosed with tongue cancer.   Dear Mrs. [name], Thank you for entrusting your patient Lori Day to my care. Mrs. 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 At the time she had already progressive osteoarthritis affecting her shoulder elbow and wrist mostly on the right side. She was heavier at the time and she was diagnosed with obstructive sleep apnea and placed on a CPAP. This all occurred where she still lived in [place]. 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 The tumor was deemed in operable and she underwent chemotherapy and radiation therapy. I'm able to review her MRI of the neck with and without contrast interpreted by Dr. San Morelle. This is dated from 07-12-14.  The MRI shows between the cervical fifth and sixth vertebrae left greater than right foraminal narrowing -but it is not clear that that would impinge the nerve. I have to add that this is the site where she received radiation to the neck. I think it is indicated now to look for myokymia and any radiation damage to the exiting nerve and brachial plexus. She has noted feet and hands have some numbness. 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 Her  left foot has newly developed  pain, that wakes her from sleep. Every time she rolls over in bed the pain arises.   She has no longer any lower jaw teeth, c she has not enough bone substance post radiation and she cannot get implants, only dentures. She wears only upper dentures and eats soft food.   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    Review of Systems: Out of a complete 14 system review, the patient complains of only the following symptoms, and all other reviewed systems are negative. Numbness and pain restless legs achy legs joint pain but not necessarily joint swelling or runny nose seasonal allergies. Her past medical history is positive for a benign tumor of the thyroid, tongue cancer, allergic rhinitis, acid reflux disease, bipolar disorder, degenerative disc disease with spinal arthritis, ulnar nerve damage on the right degenerative arthritis of multiple joints, she had a cholecystectomy, hysterectomy appendectomy and tonsillectomy. She also had a Mohs surgery on her nose, cataract surgery and ligation. Right knee meniscus arthroscopic repair, numerous D&Cs, biopsies of the thyroid and tongue.   Epworth score How likely are you to doze in the following situations: 0 = not likely, 1 = slight chance, 2 = moderate chance, 3 = high chance  Sitting and Reading? 0 Watching Television? 2 Sitting inactive in a public place (theater or meeting)? 0 Lying down in the afternoon when  circumstances permit? 3 Sitting and talking to someone? 0 Sitting quietly after lunch without alcohol? 1 In a car, while stopped for a few minutes in traffic? 2 As a passenger in a car for an hour without a break? 0  Total = 8    , Fatigue severity score 30  , depression score PHQ -9    History   Social History  . Marital Status: Single    Spouse Name: N/A    Number of Children: N/A  . Years of Education: N/A   Occupational History  . Not on  file.   Social History Main Topics  . Smoking status: Former Research scientist (life sciences)  . Smokeless tobacco: Not on file     Comment: using E cig, quit smoking yr 2015  . Alcohol Use: 0.0 oz/week    0 Not specified per week     Comment: rare  . Drug Use: No  . Sexual Activity: Not on file   Other Topics Concern  . Not on file   Social History Narrative   Caffeine 2 cups daily, Single, 2 kids, retired.    History reviewed. No pertinent family history.  Past Medical History  Diagnosis Date  . Squamous cell cancer of tongue 2014    chemo in [place], PET 07-2013  . Thyroid nodule   . Hx of chronic sinusitis   . Tongue cancer 02-02-2013    HPV/ radiation/chemo  . GERD (gastroesophageal reflux disease)   . Bipolar 1 disorder   . Arthritis   . Hiatal hernia   . History of HPV infection   . Glaucoma   . Sciatica   . Sensory hearing loss, bilateral   . Sjogren's syndrome 09-02-2007  . Cancer of tongue 09/19/2014    HPV infection, and ex smoker. She quit in 2013.   . 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 Sensory disturbance 09/19/2014    Past Surgical History  Procedure Laterality Date  . Turbinate surgery      Dr. Nicole Cella in [place].  . Cataract extraction Bilateral 05-29-10, 06-14-10  . Knee surgery  08-15-11    arthoscopy  . Tongue cancer      Current Outpatient Prescriptions  Medication Sig Dispense Refill  . cetirizine (ZYRTEC) 10 MG tablet Take 10 mg by mouth daily as needed for allergies.    Marland Kitchen FLUoxetine (PROZAC) 20 MG capsule Take 20 mg by mouth.    . latanoprost (XALATAN) 0.005 % ophthalmic solution Place 1 drop into both eyes at bedtime.     Marland Kitchen omeprazole (PRILOSEC) 20 MG capsule Take 20 mg by mouth daily.     Marland Kitchen b complex vitamins tablet Bid po OTC    . Cholecalciferol 2000 UNITS CAPS Once a day po 30 each 5   No current facility-administered medications for this visit.    Allergies as of 09/19/2014 - Review Complete 09/19/2014  Allergen Reaction Noted  . Aspirin Swelling  09/16/2014  . Compazine [prochlorperazine edisylate]  09/16/2014  . Pseudophed-chlophedianol-gg  09/16/2014  . Seroquel [quetiapine fumarate] Swelling 09/16/2014  . Latex Rash 09/16/2014  . Tape Rash 09/16/2014    Vitals: BP 114/75 mmHg  Pulse 73  Resp 14  Ht 5' 8.75" (1.746 m)  Wt 186 lb (84.369 kg)  BMI 27.68 kg/m2 Last Weight:  Wt Readings from Last 1 Encounters:  09/19/14 186 lb (84.369 kg)       Last Height:   Ht Readings from Last 1 Encounters:  09/19/14 5' 8.75" (1.746 m)    Physical  exam:  General: The patient is awake, alert and appears not in acute distress. The patient is well groomed. Head: Normocephalic, atraumatic. Neck is supple. Mallampati 2 ,  neck circumference: 14  Nasal airflow is mildly restricted , TMJ is evident . Retrognathia is not seen.  Edontulous.  Cardiovascular:  Regular rate and rhythm , without  murmurs or carotid bruit, and without distended neck veins. Respiratory: Lungs are clear to auscultation. Skin:  Without evidence of edema, or rash Trunk: BMI is elevated and patient  has normal posture.  Neurologic exam : The patient is awake and alert, oriented to place and time.   Memory subjective  described as intact. There is a normal attention span & concentration ability. Speech is fluent without dysarthria,  But dysphonia  (NASAL SPEECH) . NO  aphasia. Mood and affect are appropriate.  Cranial nerves: Pupils are equal and briskly reactive to light. Funduscopic exam without  evidence of pallor or edema, STATUS POST CATARACT . Extraocular movements  in vertical and horizontal planes intact and without nystagmus. Visual fields by finger perimetry are intact. Hearing to finger rub intact.  Facial sensation intact to fine touch. Facial motor strength is symmetric and tongue and uvula move midline.  Motor exam:  Weakness of biceps left, pronator drift, and brisk patella on the left .  Sensory:  Fine touch, pinprick and vibration were tested in all  extremities. Proprioception impaired, Coordination: Rapid alternating movements in the fingers/hands is normal. Finger-to-nose maneuver  normal withevidence of ataxia, dysmetria . Gait and station: Patient walks without assistive device and is able unassisted to climb up to the exam table.  She has a wide based gait , she is bend forwards when walking, has trouble to get out of a seated position. Stance is stable and normal. Tandem gait is impossible - Romberg testing is positive, drifting retro pulsive.  Deep tendon reflexes: in the  upper and lower extremities are symmetric and intact. Babinski maneuver response is downgoing.   Assessment:  After physical and neurologic examination, review of laboratory studies, imaging, neurophysiology testing and pre-existing records, assessment is   1) poor sleep due to pain from arthritis and neuropathy. Bipolar component is well controlled. 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  3) ataxia and dysmetria from focal weakness of the left upper extremity and neuropathic changes. Radial palsy on the left, thumb hurts on the right.       The patient was advised of the nature of the diagnosed disorder , the treatment options and risks for general a health and wellness arising from not treating the condition. Visit duration was 45 minutes.   Plan:  Treatment plan and additional workup :  EMG and NCV for myokymia and neuropathic changes in left upper and left lower extremities.  VIT D  2000 units and B complex to be taken daily .  OT to improve the left hand.  The MRI does not indicate a surgical need.  HST to establish if need for CPAP still exists or mot.     Asencion Partridge Tavaris Eudy MD  09/19/2014

## 2014-09-20 ENCOUNTER — Ambulatory Visit
Admission: RE | Admit: 2014-09-20 | Discharge: 2014-09-20 | Disposition: A | Discharge status: Home / Self Care | Payer: Federal, State, Local not specified - PPO | Source: Ambulatory Visit | Attending: Otolaryngology | Admitting: Otolaryngology

## 2014-09-20 DIAGNOSIS — E042 Nontoxic multinodular goiter: Secondary | ICD-10-CM | POA: Diagnosis not present

## 2014-09-20 DIAGNOSIS — E041 Nontoxic single thyroid nodule: Secondary | ICD-10-CM

## 2014-09-29 ENCOUNTER — Ambulatory Visit (INDEPENDENT_AMBULATORY_CARE_PROVIDER_SITE_OTHER): Payer: Medicare Other | Admitting: Neurology

## 2014-09-29 ENCOUNTER — Ambulatory Visit (INDEPENDENT_AMBULATORY_CARE_PROVIDER_SITE_OTHER): Payer: Self-pay | Admitting: Neurology

## 2014-09-29 DIAGNOSIS — D6481 Anemia due to antineoplastic chemotherapy: Secondary | ICD-10-CM

## 2014-09-29 DIAGNOSIS — G620 Drug-induced polyneuropathy: Secondary | ICD-10-CM

## 2014-09-29 DIAGNOSIS — C029 Malignant neoplasm of tongue, unspecified: Secondary | ICD-10-CM

## 2014-09-29 DIAGNOSIS — M79602 Pain in left arm: Secondary | ICD-10-CM

## 2014-09-29 DIAGNOSIS — T451X5A Adverse effect of antineoplastic and immunosuppressive drugs, initial encounter: Secondary | ICD-10-CM

## 2014-09-29 DIAGNOSIS — Z0289 Encounter for other administrative examinations: Secondary | ICD-10-CM

## 2014-09-29 DIAGNOSIS — M47812 Spondylosis without myelopathy or radiculopathy, cervical region: Secondary | ICD-10-CM

## 2014-09-29 DIAGNOSIS — M79605 Pain in left leg: Secondary | ICD-10-CM

## 2014-09-29 DIAGNOSIS — R208 Other disturbances of skin sensation: Secondary | ICD-10-CM

## 2014-09-29 DIAGNOSIS — R209 Unspecified disturbances of skin sensation: Secondary | ICD-10-CM

## 2014-09-29 DIAGNOSIS — R202 Paresthesia of skin: Secondary | ICD-10-CM

## 2014-09-29 NOTE — Progress Notes (Signed)
   [facility]    Provider:  Dr Jaynee Eagles Referring Provider: Marda Stalker, PA-C Primary Care Physician:  Marda Stalker, PA-C   HPI:  Lori Day is a 68 y.o. female here as a referral from Dr. [name] for left-sided pain and paresthesias.  She was diagnosed with head and neck cancer and she underwent chemotherapy and radiation therapy in 2014. She has stiffness in the left leg, pain radiating from the hip down the leg. She feels paralyzed on the left leg. It starts from the low back and she is having LBP and radicular symptoms radiating down the left side of the left leg. She has left hip pain. She has osteoarthritis and knows she has a bulging disk. She wakes up stretching her left leg out in her sleep, She has weakness in the left arm. Has neck discomfort. Numbness in the whole left hand but no radicular symptoms into the left arm.   Summary  Bilateral APB Median motor nerves showed normal conductions with normal F Wave latency Bilateral ADM Ulnar motor nerves showed normal conductions with normal F Wave latency The left Peroneal motor nerve showed normal conductions with normal F Wave latency The left Tibial motor nerve showed normal conductions with normal F Wave latency Bilateral second-digit Median sensory nerve conductions were within normal limits Bilateral fifth-digit Ulnar sensory nerve conductions were within normal limits Bilateral Radial sensory nerve conductions were within normal limits The left Sural sensory nerve conduction was within normal limits The left Peroneal sensory nerve conduction was within normal limits Bilateral H Reflexes showed normal latencies  EMG Needle study was performed on selected left-sided muscles:   The Deltoid, Triceps, Biceps, Pronator Teres, Flexor Digitorum Profundus, Opponens Pollicis, First Dorsal interosseous, Vastus Medialis, Anterior Tibialis, Medial Gastrocnemius, Extensor Hallucis Longus, Biceps Femoris (long  head), Gluteus Medius and Maximus muscles, L5/S1/C6/C7/C8 paraspinal muscles were all within normal limits.  Conclusion: This is a normal study. No electrophysiologic evidence for ulnar or median neuropathy, peripheral polyneuropathy, cervical or lumbar radiculopathy or radiation neuropathy. A small fiber neuropathy could evade detection by this study. Clinical correlation recommended.     Sarina Ill, MD  [facility] [address] [place], [place] [address]  Phone [phone] Fax [phone]

## 2014-10-02 NOTE — Progress Notes (Signed)
Provider: Dr Jaynee Eagles  Referring Provider: Marda Stalker, PA-C  Primary Care Physician: Marda Stalker, PA-C   HPI: Lori Day is a 68 y.o. female here as a referral from Dr. [name] for left-sided pain and paresthesias. She was diagnosed with head and neck cancer and she underwent chemotherapy and radiation therapy in 2014. She has stiffness in the left leg, pain radiating from the hip down the leg. She feels paralyzed on the left leg. It starts from the low back and she is having LBP and radicular symptoms radiating down the left side of the left leg. She has left hip pain. She has osteoarthritis and knows she has a bulging disk. She wakes up stretching her left leg out in her sleep, She has weakness in the left arm. Has neck discomfort. Numbness in the whole left hand but no radicular symptoms into the left arm.   Summary  Bilateral APB Median motor nerves showed normal conductions with normal F Wave latency  Bilateral ADM Ulnar motor nerves showed normal conductions with normal F Wave latency  The left Peroneal motor nerve showed normal conductions with normal F Wave latency  The left Tibial motor nerve showed normal conductions with normal F Wave latency  Bilateral second-digit Median sensory nerve conductions were within normal limits  Bilateral fifth-digit Ulnar sensory nerve conductions were within normal limits  Bilateral Radial sensory nerve conductions were within normal limits  The left Sural sensory nerve conduction was within normal limits  The left Peroneal sensory nerve conduction was within normal limits  Bilateral H Reflexes showed normal latencies   EMG Needle study was performed on selected left-sided muscles:  The Deltoid, Triceps, Biceps, Pronator Teres, Flexor Digitorum Profundus, Opponens Pollicis, First Dorsal interosseous, Vastus Medialis, Anterior Tibialis, Medial Gastrocnemius, Extensor Hallucis Longus, Biceps Femoris (long head), Gluteus Medius and Maximus  muscles, L5/S1/C6/C7/C8 paraspinal muscles were all within normal limits.   Conclusion: This is a normal study. No electrophysiologic evidence for ulnar or median neuropathy, peripheral polyneuropathy, cervical or lumbar radiculopathy or radiation neuropathy. A small fiber neuropathy could evade detection by this study. Clinical correlation recommended.   Sarina Ill, MD  [facility]  [address] [place]  [place], [place] [address]  Phone [phone] Fax [phone]

## 2014-10-04 ENCOUNTER — Ambulatory Visit: Payer: Medicare Other | Attending: Neurology | Admitting: Occupational Therapy

## 2014-10-04 ENCOUNTER — Encounter: Payer: Self-pay | Admitting: Occupational Therapy

## 2014-10-04 ENCOUNTER — Encounter: Payer: Self-pay | Admitting: Neurology

## 2014-10-04 DIAGNOSIS — M6281 Muscle weakness (generalized): Secondary | ICD-10-CM | POA: Diagnosis not present

## 2014-10-04 DIAGNOSIS — M25531 Pain in right wrist: Secondary | ICD-10-CM | POA: Diagnosis not present

## 2014-10-04 DIAGNOSIS — R202 Paresthesia of skin: Secondary | ICD-10-CM | POA: Insufficient documentation

## 2014-10-04 DIAGNOSIS — G4734 Idiopathic sleep related nonobstructive alveolar hypoventilation: Secondary | ICD-10-CM | POA: Insufficient documentation

## 2014-10-04 NOTE — Progress Notes (Signed)
I agree with the assessment and diagnosis as directed by Dr. Fredonia Highland . The patient is known to me . Please forward the results to the patient.     Sharita Bienaime, MD

## 2014-10-04 NOTE — Therapy (Signed)
[facility] [address] [place], [place], [address] Phone: [phone]   Fax:  [phone]  Occupational Therapy Evaluation  Patient Details  Name: Lori Day MRN: [id number] Date of Birth: 1946/02/23  Encounter Date: 10/04/2014      OT End of Session - 10/04/14 1203    Visit Number 1   Number of Visits --  1/10 G   Date for OT Re-Evaluation 11/29/14   Authorization Type Medicare primary, BCBS secondary   Authorization - Visit Number --  1/10 G   OT Start Time 1103   OT Stop Time 1147   OT Time Calculation (min) 44 min   Activity Tolerance Patient tolerated treatment well   Behavior During Therapy [facility] for tasks assessed/performed      Past Medical History  Diagnosis Date  . Squamous cell cancer of tongue 2014    chemo in [place], PET 07-2013  . Thyroid nodule   . Hx of chronic sinusitis   . Tongue cancer 02-02-2013    HPV/ radiation/chemo  . GERD (gastroesophageal reflux disease)   . Bipolar 1 disorder   . Arthritis   . Hiatal hernia   . History of HPV infection   . Glaucoma   . Sciatica   . Sensory hearing loss, bilateral   . Sjogren's syndrome 09-02-2007  . Cancer of tongue 09/19/2014    HPV infection, and ex smoker. She quit in 2013.   . 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 Sensory disturbance 09/19/2014    Past Surgical History  Procedure Laterality Date  . Turbinate surgery      Dr. Nicole Cella in [place].  . Cataract extraction Bilateral 05-29-10, 06-14-10  . Knee surgery  08-15-11    arthoscopy  . Tongue cancer     Precautions: Pt has a h/o tongue cancer, in remission per pt report. She states that she currently has nodules on her thyroid and is awaiting f/u w/ oncologist in [place] to r/o cancer. Avoid manual therapy, ultrasound, heat etc until pt is cleared by MD.  There were no vitals taken for this visit.  Visit Diagnosis:  Wrist pain, acute, right - Plan: Ot plan of care cert/re-cert  Generalized  muscle weakness - Plan: Ot plan of care cert/re-cert  Paresthesia of both hands - Plan: Ot plan of care cert/re-cert      Subjective Assessment - 10/04/14 1224    Pertinent History --  Precautions: Pt w/ h/o tongue cancer, currently in remission, she reports that she has nodules on her thyroid and is awaiting f/u w/ cancer MD in [place]. Avoid heat, ultrasound, manual therapy until cleared by MD/oncologist.          [facility] OT Assessment - 10/04/14 0001    Assessment   Diagnosis R hand/wrist pain, L hand numbness   Onset Date --  "It's been ongoing for years now"   Prior Therapy Pt reports that she has had therapy in [place], neg CTS, neg tendonitis at elbows per pt report   Precautions   Precautions None   Required Braces or Orthoses Other Brace/Splint  Pt reports that she has used prefab wrist splints in past   Other Brace/Splint --  Wrist cock up, thumb spica, neoprene wrist support R   Balance Screen   Has the patient fallen in the past 6 months No   Prior Function   Level of Independence Independent with basic ADLs  Uses Left arm when able and uses larger joints as able R  Vocation Retired   Leisure Enjoys being family historian, difficulty writing etc and hand going numb, painful   ADL   ADL comments Pt reports difficulty doing ADL's and leisure tasks due to hand/wrist pain R and neuropathy left hand. Uses left when able to compensate for right.   IADL   Prior Level of Function Shopping Pt reports has reusable bags, has them packed lightly and carries on larger joints (elbows, shoulders etc).   Written Expression   Dominant Hand Right   Written Experience Exceptions to [facility]   Effective Techniques --  Pain limits ability to write recently purchased voice recogn   Vision - History   Baseline Vision Wears glasses only for reading   Visual History Glaucome   Patient Visual Report --  Has had cataract surgery in past   Activity Tolerance   Activity Tolerance Comments --   Activity is limited according to hand pain, level of pain   Cognition   Overall Cognitive Status Within Functional Limits for tasks assessed   Sensation   Light Touch Appears Intact  R & L intact to light touch   Additional Comments --  Pt reports h/o neuropathy left hand and numbness R   Coordination   9 Hole Peg Test (p) R = 24.56sec L = 27.44sec   Other Grip w/ JAMAR position 2  R= 30# L= 49#   Right 9 Hole Peg Test --  24.44 seconds   Left 9 Hole Peg Test --  27.77 seconds   AROM   Overall AROM  Within functional limits for tasks performed   Strength   Overall Strength Deficits  Decreased grip strength and generalized weakness   Hand Function   Coordination Impaired  R & L coordination impaired - see 9 Hole Peg scores    Pt was instructed to bring braces/splints to next visit for assessment and review for appropriateness of use. Pt verbalized understanding of this and stated that she can bring them to next visit.  Pt was educated verbally and via demonstration in the clinic today [id number] wrist position (avoidance of RD and wrist extension) for functional activities (ie. Carrying a book, picking up a plate, typing on computer, when cooking etc). Pt verbalized understanding of this.   Discussed role of OT with pt and recommendations for pt education, home program, splinting (?use of pre-fab that pt has vs functional CMC splint for home activities), cont to assess.        OT Education - 10/04/14 1202    Education provided Yes   Education Details Neutral wrist for functional activitie and ADL's, bring splints/braces to next visit.   Person(s) Educated Patient   Methods Explanation;Demonstration;Verbal cues   Comprehension Verbalized understanding;Need further instruction          OT Short Term Goals - 10/04/14 1213    OT SHORT TERM GOAL #1   Title Pt will be Mod I home program for 1st dorsal compartment stretching Right   Time 4   Period Weeks   Status New    OT SHORT TERM GOAL #2   Title Pt will report pain right wrist as 3 or less out of 10 during handwriting task.   Time 4   Period Weeks   Status New   OT SHORT TERM GOAL #3   Title Pt will independently demonstrate 2-3 ADL's using neutral wrist positiong to avoid stress on CMC/1st dorsal compartment.   Time 4   Period Weeks   Status New  OT SHORT TERM GOAL #4   Title Pt will be Mod I right hand splint use, care and precautions    Time 4   Period Weeks   Status New          OT Long Term Goals - October 17, 2014 1216    OT LONG TERM GOAL #1   Title Pt will be Mod I upgraded HEP right UE   Time 8   Period Weeks   Status New   OT LONG TERM GOAL #2   Title Pt will be Mod I ADL's implementing neutral wrist during functional tasks.   Time 8   Period Weeks   Status New   OT LONG TERM GOAL #3   Title Pt will improve R hand grip strength by 5# or greater w/o increased c/o pain for ADL's   Time 8   Period Weeks   Status New   OT LONG TERM GOAL #4   Title Pt will be Mod I a/e use bilateral UE's PRN for decreased stress on joints/Mod I implementing joint protection techniques.   Time 8   Period Weeks   Status New          Plan - 10/17/2014 1206    Clinical Impression Statement Pt is a 68 y/o right hand dominant female whom presents with h/o right hand/wrist pain as well as left hand neuropathy. 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 She should benefit from out-pt OT for pt education, home program, splinting & a/e etc.. She has splints/braces at home and was instructed to bring them into her next visit.    Rehab Potential Good   OT Frequency 2x / week   OT Duration 8 weeks   OT Treatment/Interventions Self-care/ADL training;Ultrasound;Therapeutic exercise;DME and/or AE instruction;Manual Therapy;Splinting;Therapeutic  exercises;Patient/family education;Therapeutic activities  Note: pt has h/o tongue cancer - in remission, currently has nodules on thyroid & awaiting f/u w/ cancer doctor in [place].   Plan Check splints/braces next visit (pt to bring in to clinic) for appropriateness, consider hand based CMC splint R, home program/stretching.   Consulted and Agree with Plan of Care Patient          G-Codes - 2014/10/17 1220    Functional Assessment Tool Used 9 Hole Peg Test   Functional Limitation Carrying, moving and handling objects   Carrying, Moving and Handling Objects Current Status [phone]) At least 20 percent but less than 40 percent impaired, limited or restricted   Carrying, Moving and Handling Objects Goal Status (J0932) At least 1 percent but less than 20 percent impaired, limited or restricted                             Problem List Patient Active Problem List   Diagnosis Date Noted  . Sleep related hypoventilation/hypoxemia in other disease October 17, 2014  . Cancer of tongue 09/19/2014  . 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 Sensory disturbance 09/19/2014    Almyra Deforest, OT 2014-10-17, 12:31 PM

## 2014-10-04 NOTE — Patient Instructions (Addendum)
Pt was instructed to bring braces/splints to next visit for assessment and review for appropriateness of use. Pt verbalized understanding of this and stated that she can bring them to next visit.  Pt was educated verbally and via demonstration in the clinic today [id number] wrist position (avoidance of RD and wrist extension) for functional activities (ie. Carrying a book, picking up a plate, typing on computer, when cooking etc). Pt verbalized understanding of this.   Discussed role of OT with pt and recommendations for pt education, home program, splinting (?use of pre-fab that pt has vs functional CMC splint for home activities), cont to assess.

## 2014-10-05 ENCOUNTER — Ambulatory Visit: Payer: Medicare Other | Admitting: Occupational Therapy

## 2014-10-05 ENCOUNTER — Telehealth: Payer: Self-pay | Admitting: *Deleted

## 2014-10-05 ENCOUNTER — Telehealth: Payer: Self-pay | Admitting: Neurology

## 2014-10-05 ENCOUNTER — Encounter: Payer: Self-pay | Admitting: Occupational Therapy

## 2014-10-05 DIAGNOSIS — R202 Paresthesia of skin: Secondary | ICD-10-CM | POA: Diagnosis not present

## 2014-10-05 DIAGNOSIS — M6281 Muscle weakness (generalized): Secondary | ICD-10-CM

## 2014-10-05 DIAGNOSIS — G8929 Other chronic pain: Secondary | ICD-10-CM

## 2014-10-05 DIAGNOSIS — M25531 Pain in right wrist: Secondary | ICD-10-CM | POA: Diagnosis not present

## 2014-10-05 NOTE — Patient Instructions (Signed)
Pt was instructed in neutral wrist right and left during functional activities and to avoid RD/thumb extension as well as lateral pinch type activities when picking up items (ie. Plate, pot or pan, book etc) but to use palm up/neutral wrist instead when able. Examples provided and reviewed with pt. She verbalized understanding of this.  Pt was also educated re: use of wide grip handles to prevent increased stress on CMC joint and first dorsal compartment. Pt was was issued cylindrical foam for use on utensils, brushes, tooth brush, pens etc. Pt verbalized understanding and attempted signing her name with wide grip pen.   Pt was educated in home program for Finklestein's type stretch, elbow flexed and then elbow extended, thumb flexion - grasp with fingers and perform UD. Pt req vc's to perform this while avoiding thumb extension and RD after performing in clinic today x5 reps each hold x3-5 seconds each. Cont to assess.    Pt was fitted with pre-fab right thumb CMC splint that is neoprene and should provide support PRN during day during functional activities and handwriting tasks. Pt was educated in splinting use, care and precautions. She was noted to be able to don/doff independently in clinic today as well.

## 2014-10-05 NOTE — Therapy (Signed)
[facility] [address] [place], [place], [address] Phone: [phone]   Fax:  [phone]  Occupational Therapy Treatment  Patient Details  Name: Amirah Goerke MRN: [id number] Date of Birth: 1946-07-31  Encounter Date: 10/05/2014      OT End of Session - 10/05/14 1255    Visit Number 2   Number of Visits --  2/10 G   Date for OT Re-Evaluation 11/29/14   Authorization Type Medicare primary, BCBS secondary   Authorization - Visit Number --  2/10 G   OT Start Time [phone]   OT Stop Time 1010   OT Time Calculation (min) 42 min   Activity Tolerance Patient tolerated treatment well   Behavior During Therapy [facility] for tasks assessed/performed      Past Medical History  Diagnosis Date  . Squamous cell cancer of tongue 2014    chemo in [place], PET 07-2013  . Thyroid nodule   . Hx of chronic sinusitis   . Tongue cancer 02-02-2013    HPV/ radiation/chemo  . GERD (gastroesophageal reflux disease)   . Bipolar 1 disorder   . Arthritis   . Hiatal hernia   . History of HPV infection   . Glaucoma   . Sciatica   . Sensory hearing loss, bilateral   . Sjogren's syndrome 09-02-2007  . Cancer of tongue 09/19/2014    HPV infection, and ex smoker. She quit in 2013.   . 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 Sensory disturbance 09/19/2014    Past Surgical History  Procedure Laterality Date  . Turbinate surgery      Dr. Nicole Cella in [place].  . Cataract extraction Bilateral 05-29-10, 06-14-10  . Knee surgery  08-15-11    arthoscopy  . Tongue cancer      There were no vitals taken for this visit.  Visit Diagnosis:  Generalized muscle weakness  Wrist pain, chronic, right  Paresthesia of both hands      Subjective Assessment - 10/05/14 1242    Patient Stated Goals Correction of pain and weakness right hand as well as left hand neuropathy/numbness.     Note: Pt has been scheduled for f/u w/ oncologist in [place] secondary to nodules on her  thyroid and need for possible biopsy secondary to previous h/o tongue cancer (currently in remission). Avoid ultrasound, manual therapy and heat modalities until cleared by oncologist.  Pt was educated verbally in this today and verbalized understanding.       OT Treatments/Exercises (OP) - 10/05/14 0001    ADLs   ADL Comments Neutral wrist for functional activity   Discussed neutral wrist and use of palms vs RD, wrist exten   ADL Education Given --  Avoidance of RD and wrist exten w/ ADL. Use neutral wrist   Exercises   Exercises Wrist;Hand;Elbow  UD/Finklestein's Stretch w/ elbow flex and exten x5 reps ea    Hand Exercises   Other Hand Exercises Pt ed & Min vc to avoid RD, wrist exten  Functional use R hand w/ neutral wrist. Demo in clinic today   Other Hand Exercises Discussed wide grip handles for brush, toothbrush, pen, utensils etc.  Issued cylindrical foam, home use; demo & practice in clinic   Splinting   Splinting Issued pre-fab R thumb CMC splint. Pt ed in use, care and precautions. Able to don/doff I'ly. Handout for splint use, care and precautions issued and reviewed with pt in clinic today.  Pt to use PRN during day with functional activity  Pt was instructed in neutral wrist right and left during functional activities and to avoid RD/thumb extension as well as lateral pinch type activities when picking up items (ie. Plate, pot or pan, book etc) but to use palm up/neutral wrist instead when able. Examples provided and reviewed with pt. She verbalized understanding of this.  Pt was also educated re: use of wide grip handles to prevent increased stress on CMC joint and first dorsal compartment. Pt was was issued cylindrical foam for use on utensils, brushes, tooth brush, pens etc. Pt verbalized understanding and attempted signing her name with wide grip pen.   Pt was educated in home program for Finklestein's type stretch, elbow flexed and then elbow extended, thumb flexion -  grasp with fingers and perform UD. Pt req vc's to perform this while avoiding thumb extension and RD after performing in clinic today x5 reps each hold x3-5 seconds each. Cont to assess.    Pt was fitted with pre-fab right thumb CMC splint that is neoprene and should provide support PRN during day during functional activities and handwriting tasks. Pt was educated in splinting use, care and precautions. She was noted to be able to don/doff independently in clinic today as well      OT Education - 10/04/14 1202    Education provided Yes   Education Details Neutral wrist for functional activitie and ADL's, bring splints/braces to next visit.   Person(s) Educated Patient   Methods Explanation;Demonstration;Verbal cues   Comprehension Verbalized understanding;Need further instruction          OT Short Term Goals - 10/04/14 1213    OT SHORT TERM GOAL #1   Title Pt will be Mod I home program for 1st dorsal compartment stretching Right   Time 4   Period Weeks   Status New   OT SHORT TERM GOAL #2   Title Pt will report pain right wrist as 3 or less out of 10 during handwriting task.   Time 4   Period Weeks   Status New   OT SHORT TERM GOAL #3   Title Pt will independently demonstrate 2-3 ADL's using neutral wrist positiong to avoid stress on CMC/1st dorsal compartment.   Time 4   Period Weeks   Status New   OT SHORT TERM GOAL #4   Title Pt will be Mod I right hand splint use, care and precautions    Time 4   Period Weeks   Status New          OT Long Term Goals - 10/04/14 1216    OT LONG TERM GOAL #1   Title Pt will be Mod I upgraded HEP right UE   Time 8   Period Weeks   Status New   OT LONG TERM GOAL #2   Title Pt will be Mod I ADL's implementing neutral wrist during functional tasks.   Time 8   Period Weeks   Status New   OT LONG TERM GOAL #3   Title Pt will improve R hand grip strength by 5# or greater w/o increased c/o pain for ADL's   Time 8   Period Weeks    Status New   OT LONG TERM GOAL #4   Title Pt will be Mod I a/e use bilateral UE's PRN for decreased stress on joints/Mod I implementing joint protection techniques.   Time 8   Period Weeks   Status New          Plan - 10/05/14 1256  Clinical Impression Statement Pt was fitted with pre-fabricated thumb/CMC splint today in clinic for use during functional activity, handwriting etc. Pt should benefit from Finklestein's type stretching w/ elbow flexed and then extended (grasp thumb, perform UD toward small finger). Pt requires vc's to avoid RD and thumb extension during functional activity. Note: Pt has been scheduled for f/u w/ oncologist in [place] secondary to nodules on her thyroid and need for possible biopsy secondary to previous h/o tongue cancer (currently in remission). Avoid ultrasound, manual therapy and heat modalities until cleared by oncologist.  Pt was educated verbally in this today and verbalized understanding.   Rehab Potential Good   OT Frequency 2x / week   OT Duration 8 weeks   OT Treatment/Interventions Self-care/ADL training;Ultrasound;Therapeutic exercise;DME and/or AE instruction;Manual Therapy;Splinting;Therapeutic exercises;Patient/family education;Therapeutic activities   Consulted and Agree with Plan of Care Patient          G-Codes - 2014-10-28 1220    Functional Assessment Tool Used 9 Hole Peg Test   Functional Limitation Carrying, moving and handling objects   Carrying, Moving and Handling Objects Current Status [phone]) At least 20 percent but less than 40 percent impaired, limited or restricted   Carrying, Moving and Handling Objects Goal Status (Q9169) At least 1 percent but less than 20 percent impaired, limited or restricted                             Problem List Patient Active Problem List   Diagnosis Date Noted  . Sleep related hypoventilation/hypoxemia in other disease 2014/10/28  . Cancer of tongue 09/19/2014  .  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 Sensory disturbance 09/19/2014    Almyra Deforest, OT 10/05/2014, 1:05 PM

## 2014-10-05 NOTE — Telephone Encounter (Signed)
[employer] called pt back re: sleep results.

## 2014-10-05 NOTE — Telephone Encounter (Signed)
I called the patient to review the results from her recent home sleep test.  She is aware that there was no finding of significant obstructive sleep apnea.  The patient was, however, advised that her sleep study did reveal hypoxemia with a long hypoxic burden.  Dr. Brett Fairy has therefore recommended that the patient proceed with a pulmonary evaluation which will be arranged through her pcp, Marda Stalker, PA-C who has received a copy of this report via fax.  The patient understands that she will receive her test result via mail to the home address.

## 2014-10-07 ENCOUNTER — Encounter: Payer: Self-pay | Admitting: Occupational Therapy

## 2014-10-07 ENCOUNTER — Ambulatory Visit: Payer: Medicare Other | Admitting: Occupational Therapy

## 2014-10-07 DIAGNOSIS — R202 Paresthesia of skin: Secondary | ICD-10-CM | POA: Diagnosis not present

## 2014-10-07 DIAGNOSIS — M25531 Pain in right wrist: Secondary | ICD-10-CM

## 2014-10-07 DIAGNOSIS — M6281 Muscle weakness (generalized): Secondary | ICD-10-CM

## 2014-10-07 DIAGNOSIS — G8929 Other chronic pain: Secondary | ICD-10-CM

## 2014-10-07 NOTE — Therapy (Signed)
[facility] [address] [place], [place], [address] Phone: [phone]   Fax:  [phone]  Occupational Therapy Treatment  Patient Details  Name: Lori Day MRN: [id number] Date of Birth: 1945-12-10  Encounter Date: 10/07/2014      OT End of Session - 10/07/14 1252    Visit Number 3   Number of Visits --  3/10 G   Date for OT Re-Evaluation 11/29/14   Authorization Type Medicare primary, BCBS secondary   Authorization - Visit Number --  3/10 G   OT Start Time 0847   OT Stop Time 0925   OT Time Calculation (min) 38 min   Activity Tolerance Patient tolerated treatment well   Behavior During Therapy [facility] for tasks assessed/performed      Past Medical History  Diagnosis Date  . Squamous cell cancer of tongue 2014    chemo in [place], PET 07-2013  . Thyroid nodule   . Hx of chronic sinusitis   . Tongue cancer 02-02-2013    HPV/ radiation/chemo  . GERD (gastroesophageal reflux disease)   . Bipolar 1 disorder   . Arthritis   . Hiatal hernia   . History of HPV infection   . Glaucoma   . Sciatica   . Sensory hearing loss, bilateral   . Sjogren's syndrome 09-02-2007  . Cancer of tongue 09/19/2014    HPV infection, and ex smoker. She quit in 2013.   . 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 Sensory disturbance 09/19/2014    Past Surgical History  Procedure Laterality Date  . Turbinate surgery      Dr. Nicole Cella in [place].  . Cataract extraction Bilateral 05-29-10, 06-14-10  . Knee surgery  08-15-11    arthoscopy  . Tongue cancer      There were no vitals taken for this visit.  Visit Diagnosis:  Generalized muscle weakness  Wrist pain, chronic, right  Paresthesia of both hands      Subjective Assessment - 10/07/14 1248    Symptoms Pt reports that she has experienced decreased pain and symptoms right first dorsal compartment and CMC area with neoprene splint use and neutral wrist positioning during functional  activity and handwriting etc.   Pertinent History Pt w/ h/o tongue cancer, currently in remission, she has nodules on her thyroid and plans for f/u in Jan 2016 w/ her cancer MD in [place]. Avoid heat, ultrasound, manual therapy until cleared by oncologist. Note: Spoke with Leone Payor PT whom specializes in cancer rehab pts & stated that pt would be/is clear for cross friction massage and cryotherapy/heat PRN, she stated that these are not contraindications to treatment, however, she does continue to recommend no ultrasound due to Ca dx.   Patient Stated Goals Correction of pain and weakness right hand as well as left hand neuropathy/numbness.   Currently in Pain? No/denies   Pain Score 0-No pain   Pain Location Wrist   Pain Orientation Right   Multiple Pain Sites No            OT Treatments/Exercises (OP) - 10/07/14 0001    Exercises   Exercises Wrist;Hand;Elbow  UD/Finkelsteins type stretch x5 reps ea elbow flex & exten   Hand Exercises   Other Hand Exercises Pt ed & Min vc to avoid RD, wrist exten   Other Hand Exercises Reviewed wide grip handles for brush, toothbrush, pen, utensils etc.   Splinting   Splinting Reviewed splint use, care and precautions w/ pt.  R thumb   Manual  Therapy   Manual Therapy Massage  Cross friction massage to first dorsal compartment R hand   Massage Writer spoke w/ Leone Payor, PT whom specializes in Cancer treatment and lymhedema as a PT, she states that only contraindication is ultrasound, ok to perform cross friction massage RUE., cryyotherapy and/or heat. No ultrasound due to past Ca dx.  x10 min    Note: Writer spoke w/ Leone Payor, PT whom specializes in Cancer treatment and lymhedema as a PT at [facility], she states that only contraindication that pt has is ultrasound, & that it is ok to perform cross friction massage RUE., cryotherapy and/or heat. No ultrasound due to past Ca dx & pending appointment with oncologist in [place] [id number] on  thyroid.  Pt performed HEP for Finklesteins stretch in clinic today with elbow extended, thumb flexed and UD x10 reps, holding for 5-10 seconds each. Pt benefits from vc's to avoid RD and thumb extension after performing ex's.  Cross friction massage to right first dorsal compartment and 1st web space x10 min. Pt declined cryotherapy today (reports having used ice pack at home yesterday w/o adverse reactions)  Reviewed neutral wrist positioning during ADL's and functional activities and discussed hand positioning on steering wheel for upcoming trip over the holidays.   Reviewed soft neoprene thumb/CMC splint for support during functional activities, handwriting, typing etc. Pt appears I with use, wear and care.      OT Education - 10/07/14 1251    Education provided Yes   Education Details Neutral wrist for ADL's/IADL's and functional activities   Person(s) Educated Patient   Methods Explanation;Demonstration;Verbal cues   Comprehension Verbalized understanding          OT Short Term Goals - 10/07/14 1301    OT SHORT TERM GOAL #1   Title Pt will be Mod I home program for 1st dorsal compartment stretching Right   Time 4   Period Weeks   Status On-going   OT SHORT TERM GOAL #2   Title Pt will report pain right wrist as 3 or less out of 10 during handwriting task.   Time 4   Period Weeks   Status On-going   OT SHORT TERM GOAL #3   Title Pt will independently demonstrate 2-3 ADL's using neutral wrist positiong to avoid stress on CMC/1st dorsal compartment.   Time 4   Period Weeks   Status On-going   OT SHORT TERM GOAL #4   Title Pt will be Mod I right hand splint use, care and precautions    Time 4   Period Weeks   Status Achieved            Plan - 10/07/14 1256    Clinical Impression Statement Pt appears Mod I splint use, care and precautions. She should benefit from continued skilled OT to address neutral wrist positioning during ADL's and IADL's as well as cont HEP  and avoidance of thumb extension/RD.    Rehab Potential Good   OT Frequency 2x / week   OT Duration 8 weeks   OT Treatment/Interventions Self-care/ADL training;Ultrasound;Therapeutic exercise;DME and/or AE instruction;Manual Therapy;Splinting;Therapeutic exercises;Patient/family education;Therapeutic activities   Plan Finklestein stretch with elbow extended, cross friction massage R 1st dorsal compartment and 1st web space, performance of light functional activities focusing on neutral wrist positioning. Avoid RD/thumb extension.   Consulted and Agree with Plan of Care Patient  Problem List Patient Active Problem List   Diagnosis Date Noted  . Sleep related hypoventilation/hypoxemia in other disease 10/04/2014  . Cancer of tongue 09/19/2014  . 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 Sensory disturbance 09/19/2014    Almyra Deforest, OT 10/07/2014, 1:04 PM

## 2014-10-07 NOTE — Patient Instructions (Addendum)
Note: Writer spoke w/ Leone Payor, PT whom specializes in Cancer treatment and lymhedema as a PT at [facility], she states that only contraindication that pt has is ultrasound, & that it is ok to perform cross friction massage RUE., cryotherapy and/or heat. No ultrasound due to past Ca dx & pending appointment with oncologist in [place] [id number] on thyroid.  Pt performed HEP for Finklesteins stretch in clinic today with elbow extended, thumb flexed and UD x10 reps, holding for 5-10 seconds each. Pt benefits from vc's to avoid RD and thumb extension after performing ex's.  Cross friction massage to right first dorsal compartment and 1st web space x10 min. Pt declined cryotherapy today (reports having used ice pack at home yesterday w/o adverse reactions)  Reviewed neutral wrist positioning during ADL's and functional activities and discussed hand positioning on steering wheel for upcoming trip over the holidays.   Reviewed soft neoprene thumb/CMC splint for support during functional activities, handwriting, typing etc. Pt appears I with use, wear and care.

## 2014-10-10 ENCOUNTER — Ambulatory Visit: Payer: Medicare Other | Admitting: Occupational Therapy

## 2014-10-10 ENCOUNTER — Encounter: Payer: Self-pay | Admitting: Occupational Therapy

## 2014-10-10 DIAGNOSIS — M6281 Muscle weakness (generalized): Secondary | ICD-10-CM

## 2014-10-10 DIAGNOSIS — Z23 Encounter for immunization: Secondary | ICD-10-CM | POA: Diagnosis not present

## 2014-10-10 DIAGNOSIS — M25531 Pain in right wrist: Secondary | ICD-10-CM | POA: Diagnosis not present

## 2014-10-10 DIAGNOSIS — R202 Paresthesia of skin: Secondary | ICD-10-CM | POA: Diagnosis not present

## 2014-10-10 NOTE — Patient Instructions (Signed)
AROM: Thumb Flexion / Extension   Actively bend right thumb across palm as far as possible. Hold 20 seconds. Relax. Then pull thumb back into hitchhike position. Repeat 5 times per set. Do 3 sessions per day.  Copyright  VHI. All rights reserved.  AROM: Wrist Radial / Ulnar Deviation Against Gravity   With thumb toward face, gently bend wrist away from body. Elbow straight. Repeat 5 times per set.  Do 3 sessions per day.  Copyright  VHI. All rights reserved.

## 2014-10-10 NOTE — Therapy (Signed)
[facility] [address] [place], [place], [address] Phone: [phone]   Fax:  [phone]  Occupational Therapy Treatment  Patient Details  Name: Lori Day MRN: [id number] Date of Birth: 1945/11/24  Encounter Date: 10/10/2014      OT End of Session - 10/10/14 0831    Visit Number 4  4/10 G   Date for OT Re-Evaluation 11/29/14   Authorization Type Medicare primary, BCBS secondary   OT Start Time 0751   OT Stop Time [id number]   OT Time Calculation (min) 41 min   Activity Tolerance Patient tolerated treatment well      Past Medical History  Diagnosis Date  . Squamous cell cancer of tongue 2014    chemo in [place], PET 07-2013  . Thyroid nodule   . Hx of chronic sinusitis   . Tongue cancer 02-02-2013    HPV/ radiation/chemo  . GERD (gastroesophageal reflux disease)   . Bipolar 1 disorder   . Arthritis   . Hiatal hernia   . History of HPV infection   . Glaucoma   . Sciatica   . Sensory hearing loss, bilateral   . Sjogren's syndrome 09-02-2007  . Cancer of tongue 09/19/2014    HPV infection, and ex smoker. She quit in 2013.   . 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 Sensory disturbance 09/19/2014    Past Surgical History  Procedure Laterality Date  . Turbinate surgery      Dr. Nicole Cella in [place].  . Cataract extraction Bilateral 05-29-10, 06-14-10  . Knee surgery  08-15-11    arthoscopy  . Tongue cancer      There were no vitals taken for this visit.  Visit Diagnosis:  Generalized muscle weakness  Wrist pain, acute, right      Subjective Assessment - 10/10/14 0804    Symptoms feels much better since she started therapy   Currently in Pain? Yes   Pain Score 1    Pain Location Wrist   Pain Orientation Right   Pain Descriptors / Indicators Burning;Aching   Aggravating Factors  use   Pain Relieving Factors ice, splint, stretching            OT Treatments/Exercises (OP) - 10/10/14 0001    Modalities   Modalities Fluidotherapy;Cryotherapy   Cryotherapy   Number Minutes Cryotherapy 8 Minutes   Cryotherapy Location Hand;Wrist   Type of Cryotherapy Ice pack  with no adverse reactions for pain   RUE Fluidotherapy   Number Minutes Fluidotherapy 10 Minutes   RUE Fluidotherapy Location Wrist;Hand   Comments with no adverse reactions for pain          OT Education - 10/10/14 0829    Education provided Yes   Education Details activity modifications for ADLs/IADLs (while pt had ice pack on), updated HEP (isolated thumb flexion and wrist UD stretch with elbow extended), followed by initial HEP (combined UD/thumb flexion)   Person(s) Educated Patient   Methods Explanation;Demonstration;Verbal cues;Handout   Comprehension Verbalized understanding;Returned demonstration  returned demo HEP and handout provided.              Plan - 10/10/14 0937    Clinical Impression Statement Pt verbalized understanding of education provided and reports less pain.  Pt will be out of town until January.  Pt to continue HEP, splint wear, and activity modifications while out of town.   Plan reassess pain, progress as tolerated/appropriate   Consulted and Agree with Plan of Care Patient  Problem List Patient Active Problem List   Diagnosis Date Noted  . Sleep related hypoventilation/hypoxemia in other disease 10/04/2014  . Cancer of tongue 09/19/2014  . 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 Sensory disturbance 09/19/2014    [facility] 10/10/2014, 10:27 AM  Vianne Bulls, OTR/L 10/10/2014 10:27 AM Phone: [phone] Fax: [phone]

## 2014-10-11 ENCOUNTER — Encounter: Payer: Medicare Other | Admitting: *Deleted

## 2014-11-07 DIAGNOSIS — E079 Disorder of thyroid, unspecified: Secondary | ICD-10-CM | POA: Diagnosis not present

## 2014-11-07 DIAGNOSIS — E041 Nontoxic single thyroid nodule: Secondary | ICD-10-CM | POA: Diagnosis not present

## 2014-11-07 DIAGNOSIS — C01 Malignant neoplasm of base of tongue: Secondary | ICD-10-CM | POA: Diagnosis not present

## 2014-11-08 ENCOUNTER — Encounter: Payer: Medicare Other | Admitting: Occupational Therapy

## 2014-11-10 ENCOUNTER — Encounter: Payer: Medicare Other | Admitting: Occupational Therapy

## 2014-11-14 ENCOUNTER — Encounter: Payer: Medicare Other | Admitting: Occupational Therapy

## 2014-11-17 ENCOUNTER — Encounter: Payer: Medicare Other | Admitting: Occupational Therapy

## 2014-11-21 ENCOUNTER — Encounter: Payer: Medicare Other | Admitting: Occupational Therapy

## 2014-11-24 ENCOUNTER — Encounter: Payer: Medicare Other | Admitting: Occupational Therapy

## 2014-11-28 ENCOUNTER — Encounter: Payer: Medicare Other | Admitting: Occupational Therapy

## 2014-12-01 ENCOUNTER — Encounter: Payer: Medicare Other | Admitting: Occupational Therapy

## 2014-12-02 DIAGNOSIS — D2311 Other benign neoplasm of skin of right eyelid, including canthus: Secondary | ICD-10-CM | POA: Diagnosis not present

## 2014-12-02 DIAGNOSIS — H40013 Open angle with borderline findings, low risk, bilateral: Secondary | ICD-10-CM | POA: Diagnosis not present

## 2014-12-02 DIAGNOSIS — Z961 Presence of intraocular lens: Secondary | ICD-10-CM | POA: Diagnosis not present

## 2014-12-05 ENCOUNTER — Encounter: Payer: Medicare Other | Admitting: Occupational Therapy

## 2014-12-06 DIAGNOSIS — H40052 Ocular hypertension, left eye: Secondary | ICD-10-CM | POA: Diagnosis not present

## 2014-12-08 ENCOUNTER — Encounter: Payer: Medicare Other | Admitting: Occupational Therapy

## 2014-12-09 DIAGNOSIS — G4736 Sleep related hypoventilation in conditions classified elsewhere: Secondary | ICD-10-CM | POA: Diagnosis not present

## 2014-12-09 DIAGNOSIS — F3130 Bipolar disorder, current episode depressed, mild or moderate severity, unspecified: Secondary | ICD-10-CM | POA: Diagnosis not present

## 2014-12-09 DIAGNOSIS — K219 Gastro-esophageal reflux disease without esophagitis: Secondary | ICD-10-CM | POA: Diagnosis not present

## 2014-12-12 ENCOUNTER — Encounter: Payer: Medicare Other | Admitting: Occupational Therapy

## 2014-12-13 DIAGNOSIS — Z136 Encounter for screening for cardiovascular disorders: Secondary | ICD-10-CM | POA: Diagnosis not present

## 2014-12-13 DIAGNOSIS — Z0000 Encounter for general adult medical examination without abnormal findings: Secondary | ICD-10-CM | POA: Diagnosis not present

## 2014-12-13 DIAGNOSIS — Z1211 Encounter for screening for malignant neoplasm of colon: Secondary | ICD-10-CM | POA: Diagnosis not present

## 2014-12-15 ENCOUNTER — Encounter: Payer: Medicare Other | Admitting: Occupational Therapy

## 2014-12-19 ENCOUNTER — Encounter: Payer: Medicare Other | Admitting: Occupational Therapy

## 2014-12-22 ENCOUNTER — Encounter: Payer: Medicare Other | Admitting: Occupational Therapy

## 2014-12-26 ENCOUNTER — Encounter: Payer: Medicare Other | Admitting: Occupational Therapy

## 2014-12-29 ENCOUNTER — Encounter: Payer: Medicare Other | Admitting: Occupational Therapy

## 2015-01-23 ENCOUNTER — Institutional Professional Consult (permissible substitution): Payer: Medicare Other | Admitting: Pulmonary Disease

## 2015-02-06 DIAGNOSIS — E041 Nontoxic single thyroid nodule: Secondary | ICD-10-CM | POA: Diagnosis not present

## 2015-02-06 DIAGNOSIS — E079 Disorder of thyroid, unspecified: Secondary | ICD-10-CM | POA: Diagnosis not present

## 2015-02-06 DIAGNOSIS — C01 Malignant neoplasm of base of tongue: Secondary | ICD-10-CM | POA: Diagnosis not present

## 2015-02-15 DIAGNOSIS — R432 Parageusia: Secondary | ICD-10-CM | POA: Diagnosis not present

## 2015-02-15 DIAGNOSIS — C01 Malignant neoplasm of base of tongue: Secondary | ICD-10-CM | POA: Diagnosis not present

## 2015-02-15 DIAGNOSIS — R1312 Dysphagia, oropharyngeal phase: Secondary | ICD-10-CM | POA: Diagnosis not present

## 2015-02-15 DIAGNOSIS — K117 Disturbances of salivary secretion: Secondary | ICD-10-CM | POA: Diagnosis not present

## 2015-02-15 DIAGNOSIS — D449 Neoplasm of uncertain behavior of unspecified endocrine gland: Secondary | ICD-10-CM | POA: Diagnosis not present

## 2015-02-23 DIAGNOSIS — W888XXS Exposure to other ionizing radiation, sequela: Secondary | ICD-10-CM | POA: Diagnosis not present

## 2015-02-23 DIAGNOSIS — K08109 Complete loss of teeth, unspecified cause, unspecified class: Secondary | ICD-10-CM | POA: Diagnosis not present

## 2015-03-15 ENCOUNTER — Encounter: Payer: Self-pay | Admitting: Pulmonary Disease

## 2015-03-15 ENCOUNTER — Ambulatory Visit (INDEPENDENT_AMBULATORY_CARE_PROVIDER_SITE_OTHER)
Admission: RE | Admit: 2015-03-15 | Discharge: 2015-03-15 | Disposition: A | Discharge status: Home / Self Care | Payer: Medicare Other | Source: Ambulatory Visit | Attending: Pulmonary Disease | Admitting: Pulmonary Disease

## 2015-03-15 ENCOUNTER — Ambulatory Visit (INDEPENDENT_AMBULATORY_CARE_PROVIDER_SITE_OTHER): Payer: Medicare Other | Admitting: Pulmonary Disease

## 2015-03-15 VITALS — BP 120/68 | HR 75 | Temp 97.9°F | Ht 68.5 in | Wt 188.8 lb

## 2015-03-15 DIAGNOSIS — J449 Chronic obstructive pulmonary disease, unspecified: Secondary | ICD-10-CM | POA: Diagnosis not present

## 2015-03-15 DIAGNOSIS — G4734 Idiopathic sleep related nonobstructive alveolar hypoventilation: Secondary | ICD-10-CM | POA: Diagnosis not present

## 2015-03-15 NOTE — Patient Instructions (Signed)
Will schedule for breathing studies, and will arrange followup once the results are available Will check chesr xray today and call you with results. Work on stopping smoking. Will hold off on starting oxygen at night right now, and will see if we can get you doing better with smoking cessation and possibly inhaled medications.

## 2015-03-15 NOTE — Progress Notes (Signed)
   Subjective:    Patient ID: Lori Day, female    DOB: Apr 05, 1946, 69 y.o.   MRN: [id number]  HPI The patient is a 69 year old female who I've been asked to see for nocturnal hypoxemia. She has a history of obstructive sleep apnea dating back to 2007, for which she has been on C Pap. She stopped using her device because of a diagnosis of tongue cancer, but recently had a home sleep test that showed no significant sleep disordered breathing. She did have oxygen desaturation as low as 82%, and spent over 2 hours at a saturation less than 88%. The patient tells me that she also carries a diagnosis of COPD, and was seen by a pulmonologist while living in [place]. She has a long history of smoking, but has decreased it to social smoking and vapor at this time. She is on no bronchodilator medication currently. She tells me that she has a 1 have mild dyspnea on exertion at a moderate pace on flat ground. However, she will get winded walking up stairs or bringing groceries in from the car. He denies any chronic cough or mucus production.   Review of Systems  Constitutional: Negative for fever and unexpected weight change.  HENT: Positive for dental problem. Negative for congestion, ear pain, nosebleeds, postnasal drip, rhinorrhea, sinus pressure, sneezing, sore throat and trouble swallowing.   Eyes: Negative for redness and itching.  Respiratory: Positive for shortness of breath. Negative for cough, chest tightness and wheezing.   Cardiovascular: Positive for leg swelling. Negative for palpitations.  Gastrointestinal: Negative for nausea and vomiting.  Genitourinary: Negative for dysuria.  Musculoskeletal: Positive for arthralgias. Negative for joint swelling.  Skin: Negative for rash.  Neurological: Negative for headaches.  Hematological: Does not bruise/bleed easily.  Psychiatric/Behavioral: Negative for dysphoric mood. The patient is not nervous/anxious.        Objective:   Physical  Exam Constitutional:  Well developed, no acute distress  HENT:  Nares patent without discharge  Oropharynx without exudate, palate and uvula are normal  Eyes:  Perrla, eomi, no scleral icterus  Neck:  No JVD, no TMG  Cardiovascular:  Normal rate, regular rhythm, no rubs or gallops.  No murmurs        Intact distal pulses  Pulmonary :  Normal breath sounds, no stridor or respiratory distress   No rales, rhonchi, or wheezing  Abdominal:  Soft, nondistended, bowel sounds present.  No tenderness noted.   Musculoskeletal:  No significant lower extremity edema noted.  Lymph Nodes:  No cervical lymphadenopathy noted  Skin:  No cyanosis noted  Neurologic:  Alert, appropriate, moves all 4 extremities without obvious deficit.         Assessment & Plan:

## 2015-03-15 NOTE — Progress Notes (Signed)
I agree with the assessment and plan as directed by NP .The patient is known to me .   Benjerman Molinelli, MD  

## 2015-03-15 NOTE — Assessment & Plan Note (Signed)
The patient has significant nocturnal hypoxemia on a recent sleep study, but did not have clinically significant sleep disordered breathing. She has a long history of tobacco abuse, and tells me that she was diagnosed with COPD by PFTs while living in [place]. She is continuing to smoke socially, and does admit that she has dyspnea with heavy her exertional activities. At this point, she will need to have follow-up PFTs, chest x-ray, and will see her back to review. If she does indeed have significant COPD, will start her on a bronchodilator regimen, and get her to work on smoking cessation. I would hold off on starting nocturnal oxygen until we give her a chance to improve her airway inflammation. She will need overnight oximetry anyway in order to start oxygen, since Medicare does not recognize oximetry from sleep studies.

## 2015-03-16 ENCOUNTER — Ambulatory Visit (INDEPENDENT_AMBULATORY_CARE_PROVIDER_SITE_OTHER): Payer: Medicare Other | Admitting: Pulmonary Disease

## 2015-03-16 DIAGNOSIS — G4734 Idiopathic sleep related nonobstructive alveolar hypoventilation: Secondary | ICD-10-CM

## 2015-03-16 LAB — PULMONARY FUNCTION TEST
DL/VA % pred: 60 %
DL/VA: 3.21 ml/min/mmHg/L
DLCO unc % pred: 59 %
DLCO unc: 18.13 ml/min/mmHg
FEF 25-75 Post: 1.5 L/sec
FEF 25-75 Pre: 0.98 L/sec
FEF2575-%Change-Post: 52 %
FEF2575-%Pred-Post: 66 %
FEF2575-%Pred-Pre: 43 %
FEV1-%Change-Post: 16 %
FEV1-%Pred-Post: 82 %
FEV1-%Pred-Pre: 71 %
FEV1-Post: 2.3 L
FEV1-Pre: 1.98 L
FEV1FVC-%Change-Post: 5 %
FEV1FVC-%Pred-Pre: 79 %
FEV6-%Change-Post: 8 %
FEV6-%Pred-Post: 100 %
FEV6-%Pred-Pre: 92 %
FEV6-Post: 3.5 L
FEV6-Pre: 3.21 L
FEV6FVC-%Change-Post: 0 %
FEV6FVC-%Pred-Post: 102 %
FEV6FVC-%Pred-Pre: 102 %
FVC-%Change-Post: 9 %
FVC-%Pred-Post: 98 %
FVC-%Pred-Pre: 89 %
FVC-Post: 3.57 L
FVC-Pre: 3.26 L
Post FEV1/FVC ratio: 64 %
Post FEV6/FVC ratio: 98 %
Pre FEV1/FVC ratio: 61 %
Pre FEV6/FVC Ratio: 99 %
RV % pred: 107 %
RV: 2.55 L
TLC % pred: 103 %
TLC: 5.95 L

## 2015-03-16 NOTE — Progress Notes (Signed)
PFT done today. 

## 2015-03-20 DIAGNOSIS — J329 Chronic sinusitis, unspecified: Secondary | ICD-10-CM | POA: Diagnosis not present

## 2015-03-21 ENCOUNTER — Encounter: Payer: Self-pay | Admitting: Pulmonary Disease

## 2015-03-21 ENCOUNTER — Ambulatory Visit (INDEPENDENT_AMBULATORY_CARE_PROVIDER_SITE_OTHER): Payer: Medicare Other | Admitting: Pulmonary Disease

## 2015-03-21 VITALS — BP 108/72 | HR 77 | Temp 97.5°F | Ht 68.5 in | Wt 186.6 lb

## 2015-03-21 DIAGNOSIS — J438 Other emphysema: Secondary | ICD-10-CM

## 2015-03-21 DIAGNOSIS — G4734 Idiopathic sleep related nonobstructive alveolar hypoventilation: Secondary | ICD-10-CM

## 2015-03-21 DIAGNOSIS — J439 Emphysema, unspecified: Secondary | ICD-10-CM | POA: Insufficient documentation

## 2015-03-21 MED ORDER — ALBUTEROL SULFATE 108 (90 BASE) MCG/ACT IN AEPB: 2.0000 | INHALATION_SPRAY | Freq: Four times a day (QID) | RESPIRATORY_TRACT | Status: DC | PRN

## 2015-03-21 NOTE — Assessment & Plan Note (Signed)
The patient has moderate airflow obstruction on her most recent PFTs, but did have a 16% improvement in FEV1 with bronchodilator. Her prior history of smoking, and also her decreased diffuse capacity, I suspect her COPD is secondary to emphysema. I would like to start her on a maintenance bronchodilator regimen, and I've also encouraged her to totally quit smoking. She will come back and proximally 4 weeks to check on her progress, and at that time I would order a follow-up overnight oximetry to see if she does indeed need nocturnal oxygen.

## 2015-03-21 NOTE — Progress Notes (Signed)
   Subjective:    Patient ID: Lori Day, female    DOB: [birth year], 69 y.o.   MRN: [id number]  HPI The patient comes in today for follow-up of her pulmonary function studies. She was initially referred for nocturnal hypoxemia, and did not have sleep disordered breathing. She had PFTs recently that showed moderate airflow obstruction, but a good response to bronchodilator. Her DLCO is also moderately reduced, consistent with the diagnosis of emphysema. I have reviewed her breathing studies with her in detail, and answered all of her questions.   Review of Systems  Constitutional: Negative for fever and unexpected weight change.  HENT: Positive for congestion, postnasal drip, rhinorrhea and sinus pressure. Negative for dental problem, ear pain, nosebleeds, sneezing, sore throat and trouble swallowing.   Eyes: Negative for redness and itching.  Respiratory: Positive for cough. Negative for chest tightness, shortness of breath and wheezing.   Cardiovascular: Negative for palpitations and leg swelling.  Gastrointestinal: Negative for nausea and vomiting.  Genitourinary: Negative for dysuria.  Musculoskeletal: Negative for joint swelling.  Skin: Negative for rash.  Neurological: Negative for headaches.  Hematological: Does not bruise/bleed easily.  Psychiatric/Behavioral: Negative for dysphoric mood. The patient is not nervous/anxious.        Objective:   Physical Exam Well-developed female in no acute distress Nose without purulence or discharge noted Neck without lymphadenopathy or thyromegaly Lower extremities with minimal edema, no cyanosis Alert and oriented, moves all 4 extremities.       Assessment & Plan:

## 2015-03-21 NOTE — Patient Instructions (Signed)
Will try you on stiolto 2 inhalations each am every day for next 4 weeks.  Samples provided.  proair respiclick, 2 inhalation up to every 6 hrs if needed for rescue after giving yourself a few minutes to improve. followup with Dr. Lenna Gilford in 4 weeks, and we will arrange for a repeat check of your oxygen level during sleep at that visit.

## 2015-04-12 DIAGNOSIS — F3130 Bipolar disorder, current episode depressed, mild or moderate severity, unspecified: Secondary | ICD-10-CM | POA: Diagnosis not present

## 2015-04-12 DIAGNOSIS — Z720 Tobacco use: Secondary | ICD-10-CM | POA: Diagnosis not present

## 2015-04-12 DIAGNOSIS — K529 Noninfective gastroenteritis and colitis, unspecified: Secondary | ICD-10-CM | POA: Diagnosis not present

## 2015-04-12 DIAGNOSIS — R238 Other skin changes: Secondary | ICD-10-CM | POA: Diagnosis not present

## 2015-04-12 DIAGNOSIS — E041 Nontoxic single thyroid nodule: Secondary | ICD-10-CM | POA: Diagnosis not present

## 2015-04-17 IMAGING — CT CT OUTSIDE FILMS BODY
1 of 10 series · 3 of 16 positions shown, 4 images · non-contrast
Comparison: none

[Series 3: ct fusion · axial · 0.98mm/px · z∈[-1074,-216]mm · 3 of 342 slices shown, 4 images]
[im 1/342  soft-tissue]
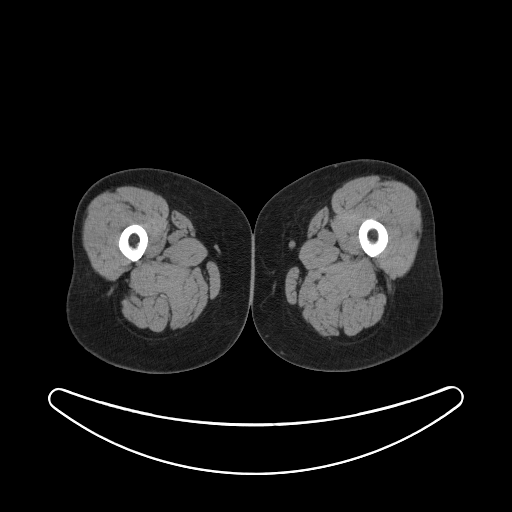
[im 1/342  bone]
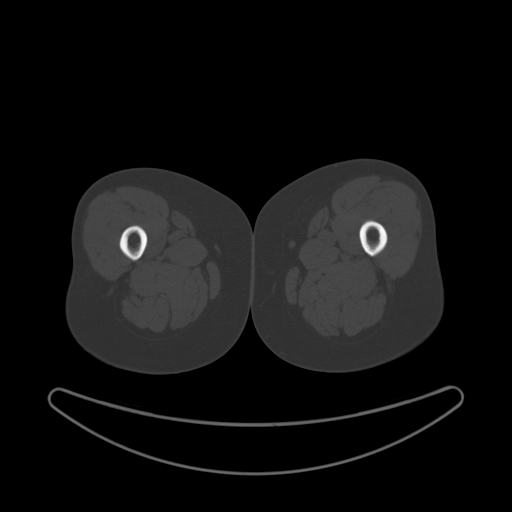
[im 171/342  soft-tissue]
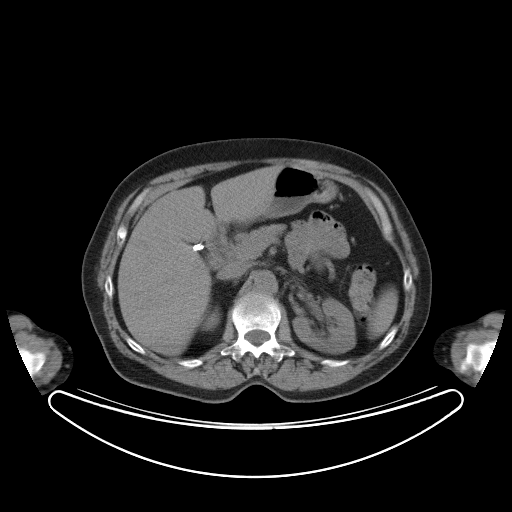
[im 342/342  soft-tissue]
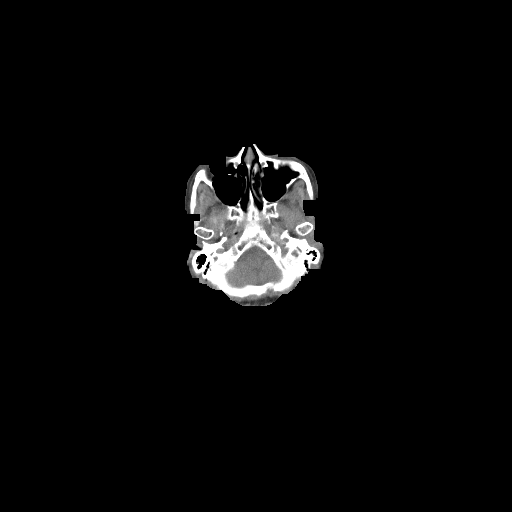

[3 of 16 positions shown; findings below may reference images not displayed]

Canned report from images found in remote index.

Refer to host system for actual result text.

## 2015-04-18 ENCOUNTER — Ambulatory Visit (INDEPENDENT_AMBULATORY_CARE_PROVIDER_SITE_OTHER): Payer: Medicare Other | Admitting: Pulmonary Disease

## 2015-04-18 VITALS — BP 116/62 | HR 84 | Temp 97.0°F | Wt 189.2 lb

## 2015-04-18 DIAGNOSIS — C029 Malignant neoplasm of tongue, unspecified: Secondary | ICD-10-CM

## 2015-04-18 DIAGNOSIS — G4734 Idiopathic sleep related nonobstructive alveolar hypoventilation: Secondary | ICD-10-CM

## 2015-04-18 DIAGNOSIS — J432 Centrilobular emphysema: Secondary | ICD-10-CM | POA: Diagnosis not present

## 2015-04-18 MED ORDER — TIOTROPIUM BROMIDE-OLODATEROL 2.5-2.5 MCG/ACT IN AERS: 2.0000 | INHALATION_SPRAY | Freq: Every day | RESPIRATORY_TRACT | Status: AC

## 2015-04-18 MED ORDER — TIOTROPIUM BROMIDE-OLODATEROL 2.5-2.5 MCG/ACT IN AERS: 2.0000 | INHALATION_SPRAY | Freq: Every day | RESPIRATORY_TRACT | Status: DC

## 2015-04-18 NOTE — Patient Instructions (Signed)
Adilynne- it was nice meeting you today...  We discussed continuing the Stiolto- 2 sprays each AM, and the Proair rescue inhaler as needed...  We will arrange for an overnight oximetry test (ONO)...    We will contact you w/ the results when available...   In the meanwhile> congrats on the smoking cessation!!!    You may use the Chantix, etc... And stay as active as possible...  Call for any questions...  Let's plan a follow up visit in 3-6mo sooner if needed for problems..Marland KitchenMarland Kitchen

## 2015-04-19 DIAGNOSIS — R197 Diarrhea, unspecified: Secondary | ICD-10-CM | POA: Diagnosis not present

## 2015-04-19 DIAGNOSIS — Z86010 Personal history of colonic polyps: Secondary | ICD-10-CM | POA: Diagnosis not present

## 2015-04-20 ENCOUNTER — Encounter: Payer: Self-pay | Admitting: Pulmonary Disease

## 2015-04-20 IMAGING — US US OUTSIDE FILMS SOFT TISSUE NECK
1 series · 14 of 25 positions shown · non-contrast
Comparison: none

[Series 1: us outside films soft tissue neck · 14 of 30 slices shown]
[im 1/30]
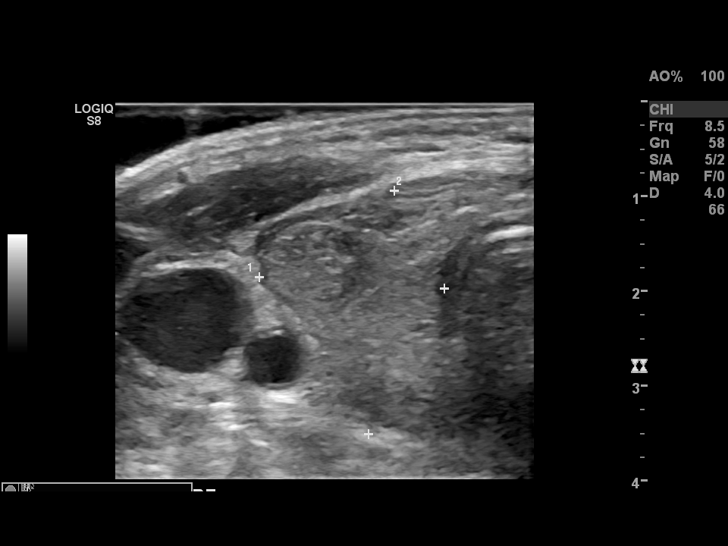
[im 3/30]
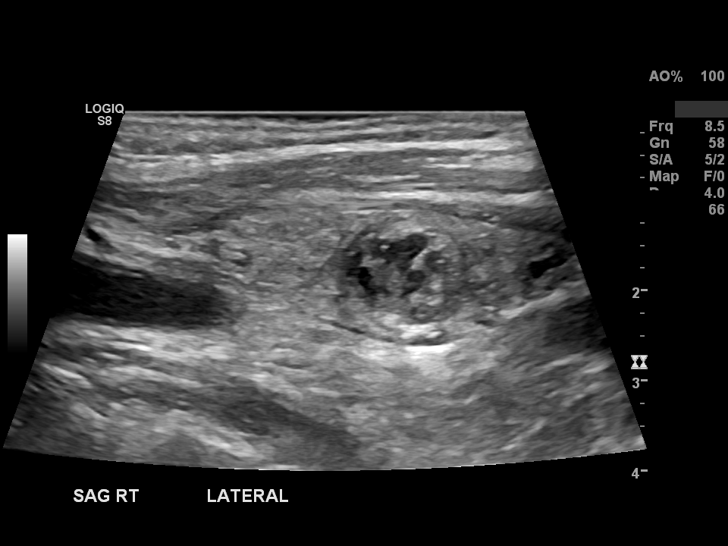
[im 5/30]
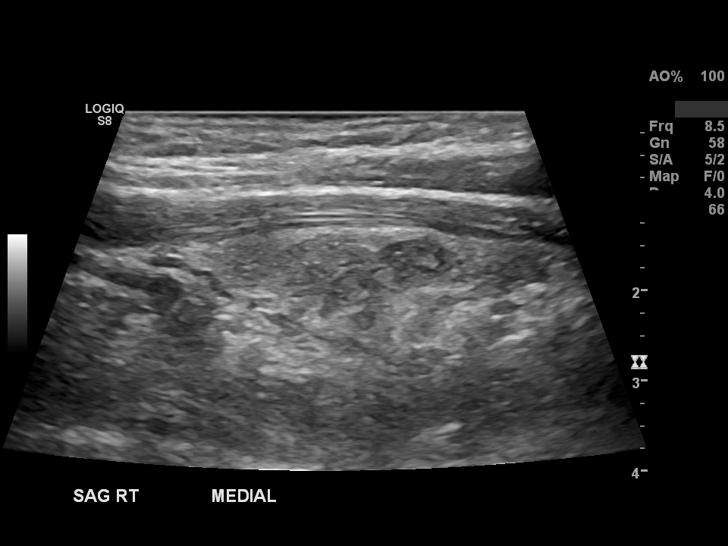
[im 8/30]
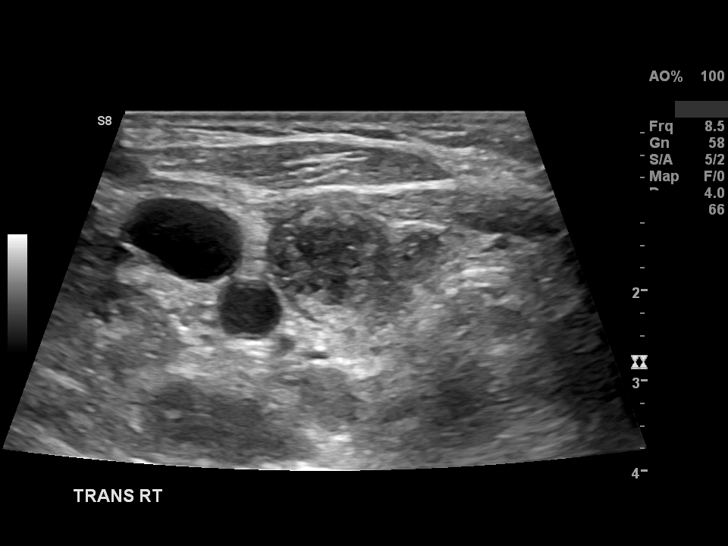
[im 10/30]
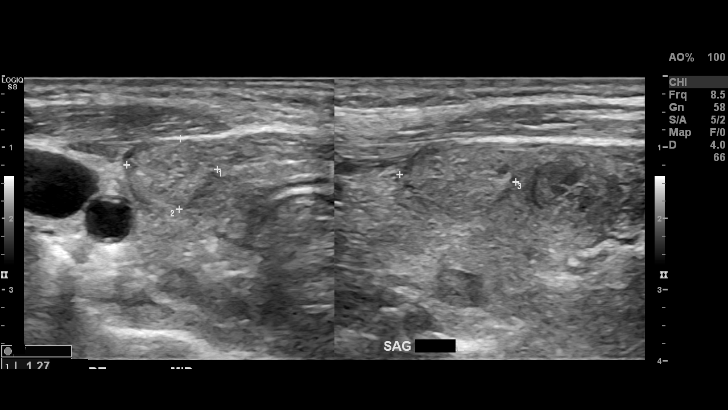
[im 11/30]
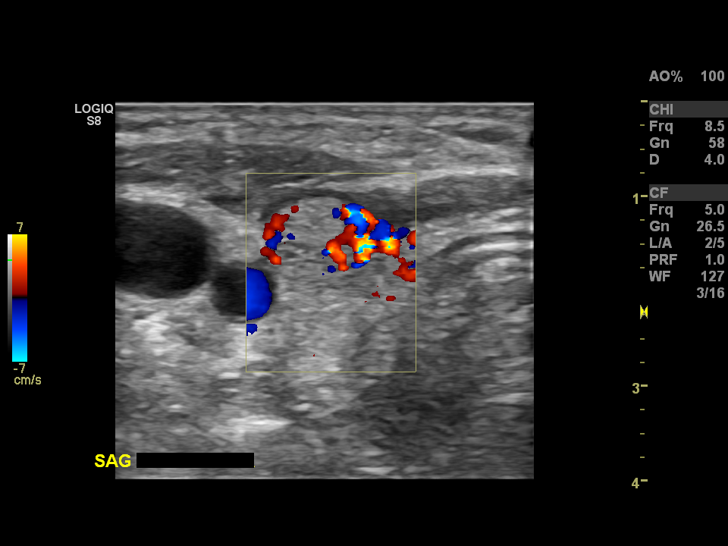
[im 14/30]
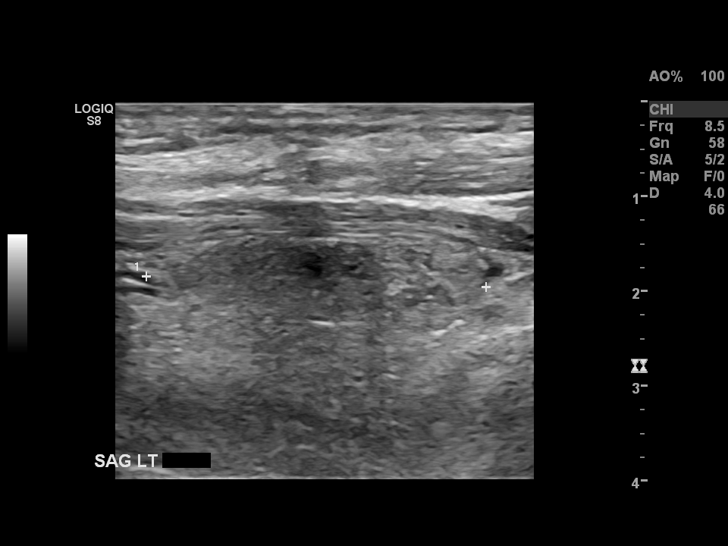
[im 16/30]
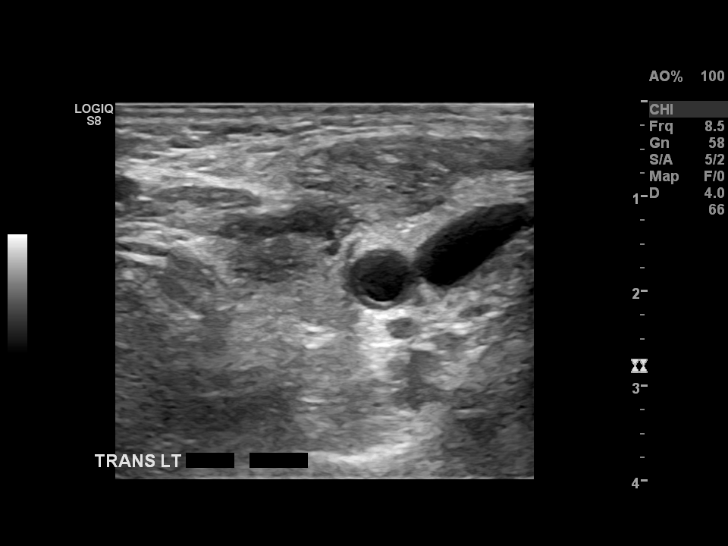
[im 19/30]
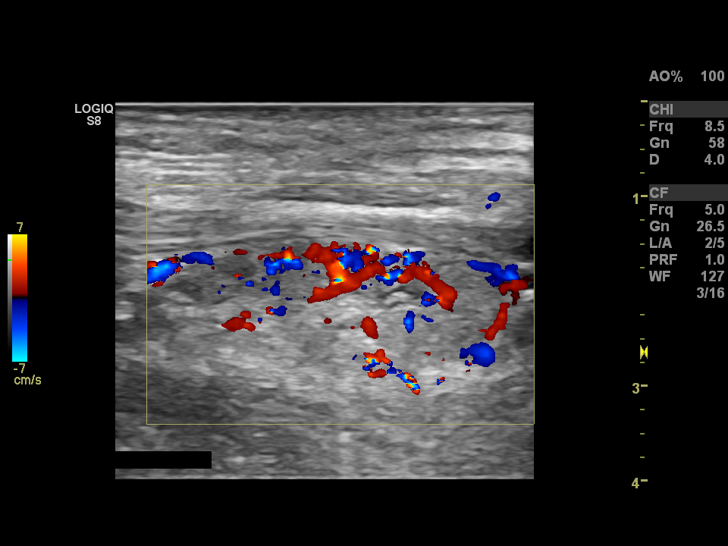
[im 20/30]
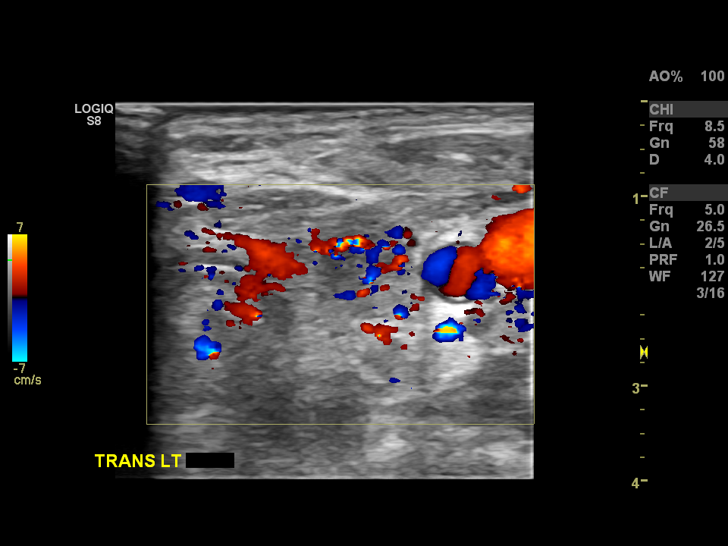
[im 22/30]
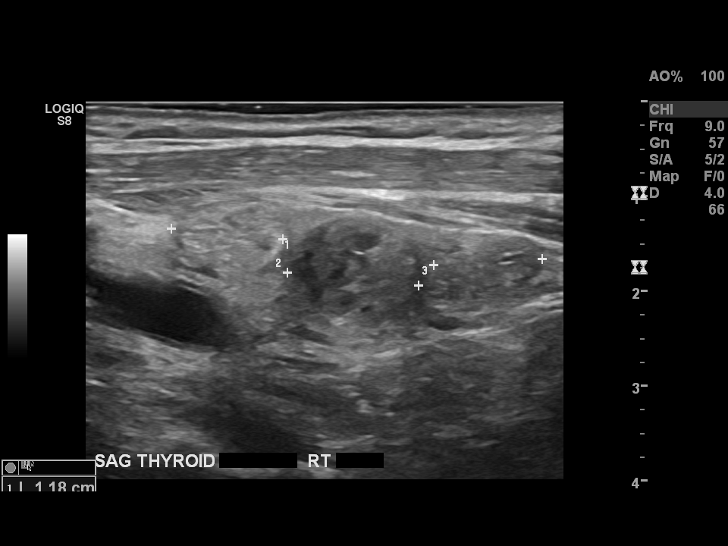
[im 25/30]
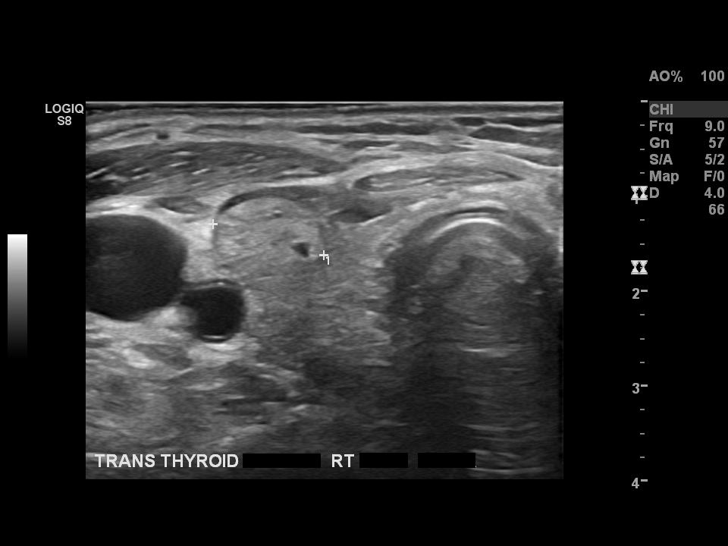
[im 27/30]
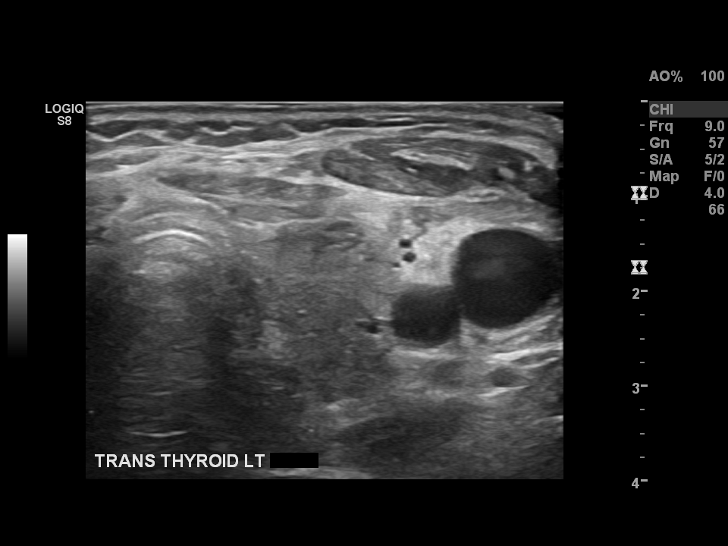
[im 30/30]
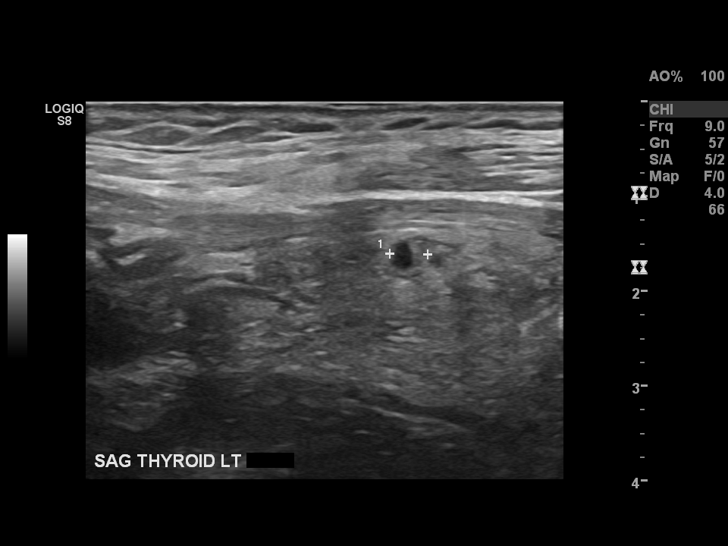

[14 of 25 positions shown; findings below may reference images not displayed]

Canned report from images found in remote index.

Refer to host system for actual result text.

## 2015-04-20 NOTE — Progress Notes (Addendum)
Subjective:     Patient ID: Lori Day, female   DOB: 1946-07-06, 69 y.o.   MRN: [id number]  HPI     Her PCP is [facility], Lori Stalker PA...      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       She relates a hx going back to 2009 in [place]- she had a sleep study- ?results, but she was started on CPAP; she subseq lost weight & improved so she stopped the CPAP on her own;  She was later re-tested and was said to have improved but she doesn't know the details;  Then in 2013 she had another abn sleep test & her physician restarted her CPAP (this was before her tongue cancer Dx);  She again stopped the CPAP in 2013 w/ the tongue cancer dx & treatment regimen of XRT & Chemo;  Then in 2015 she had a neuro eval by DrDohmeier (pt c/o pain & numbness in extremities, hx of pinched nerve, hx CTS, MRI neck w/ foraminal narrowing C5-C6) which included a home sleep study reported neg for OSA but she had nocturnal hypoxemia down to 70% sat she says and pt was referred to DrClance...       She saw DrClance 02/2015 & he reviewed her 08/2014 home sleep test- AHI=3.9 events/hr, lowest O2sat=82% (2hrs below 88%); she also has a hx COPD prev followed by a pulmonologist in [place]; still smoking as noted above; hx DOE w/ stairs and inclines or if rushing on level ground; denied much cough or sput; CXR showed COPD, NAD, and PFTs showed mod airflow obstruction w/ decr DLCO  (59%) and a reversible component;  He tried her on Stiolto- 2sp daily which she has taken over the last month- "about the same" she says (but still smoking)...      Current Meds>  Stiolto Respimat- 2 sprays once daily; Albuterol-HFA rescue inhaler prn, Chantix starter pack per her PCP, Zyrtek10/ Flonase Qhs...        EXAM reveals Afeb, VSS, O2sat=95% on RA at rest;  HEENT- denture, no lesions seen, mallampati2;  Chest- decr BS bilat w/o w/r/r;  Heart- RR w/o m/r/g;  Ext- neg w/o c/c/e...   CXR 03/15/15 showed norm heart size, COPD/emphysema, min atx/scarring at bases, NAD...  PFTs 03/16/15 showed FVC=3.26 (89%), FEV1=1.98 (71%), %1sec=61, mid-flows reduced at 43% predicted; after bronchodil FEV1 improved 16%; Lung volumes wnl; DLCO reduced at 59%... This is c/u mod airflow obstruction, emphysema, and a surprising reversible component...  ONO before considering nocturnal oxygen (Medicare doesn't accept oximitry from sleep studies)> pending IMP/PLAN>>  Lori Day has mod COPD/emphysema & a small reversible component; she needs to quit all smoking & use the Stiolto regularly, plus her prn rescue inhaler;  She has been found to have nocturnal hypoxemia but no signif OSA at this time- we will proceed w/ her ONO to see if  she needs nocturnal oxygen...  ADDENDUM>>  ONO performed 06/15/15 w/ O2sat<88% for almost 5h of the total 7h study (there were 69 events or 9/hr;  She needs O2 at 2L/min Qhs and MUST QUIT ALL SMOKING!    Past Medical History  Diagnosis Date   COPD/emphsema, cig smoker >>     OSA >>    . 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 Thyroid nodule >> states she had eval & f/u in [place], Bx=neg   . Hx of chronic sinusitis   . GERD (gastroesophageal reflux disease) >> on Omeprazole20   . Bipolar 1 disorder >> she reports off all meds x PROZAC20   . 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 Hiatal hernia   . History of HPV infection   . Glaucoma   . Sciatica   . Sensory hearing loss, bilateral   . Sjogren's syndrome 09-02-2007  . 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 Skin cancer w/ Moh's surg on right side of nose     Past Surgical History  Procedure Laterality Date  . Turbinate surgery      Dr. Nicole Cella in [place].  . Cataract extraction Bilateral 05-29-10, 06-14-10  . Knee surgery  08-15-11    arthoscopy  . Tongue cancer    . Appendectomy    . Tubal ligation  1981  . Abdominal hysterectomy      Outpatient Encounter Prescriptions as of 04/18/2015  Medication Sig  . Albuterol Sulfate (PROAIR RESPICLICK) 952 (90 BASE) MCG/ACT AEPB Inhale 2 puffs into the lungs every 6 (six) hours as needed.  Marland Kitchen b complex vitamins tablet Bid po OTC  . cetirizine (ZYRTEC) 10 MG tablet Take 10 mg by mouth daily as needed for allergies.  . CHANTIX STARTING MONTH PAK 0.5 MG X 11 & 1 MG X 42 tablet See admin instructions.  Marland Kitchen FLUoxetine (PROZAC) 20 MG capsule Take 20 mg by mouth.  . fluticasone (FLONASE) 50 MCG/ACT nasal spray Place 2 sprays into both nostrils daily.  Marland Kitchen GLUCOSA-CHONDR-NA CHONDR-MSM PO Take by mouth daily. W/ vit d  . omeprazole (PRILOSEC) 20 MG capsule Take 20 mg by mouth daily.   . Probiotic Product (PROBIOTIC & ACIDOPHILUS EX ST PO) Take 1 tablet by mouth daily.  . Tiotropium Bromide-Olodaterol (STIOLTO RESPIMAT) 2.5-2.5 MCG/ACT AERS Inhale 2 puffs into the lungs daily.  . Tiotropium Bromide-Olodaterol (STIOLTO RESPIMAT) 2.5-2.5 MCG/ACT AERS Inhale 2 puffs into the lungs daily.  . [EXPIRED] Tiotropium Bromide-Olodaterol (STIOLTO RESPIMAT) 2.5-2.5 MCG/ACT AERS Inhale 2 puffs into the lungs daily.  . [DISCONTINUED] amoxicillin-clavulanate (AUGMENTIN) 875-125 MG per tablet 2 (two) times daily.   No facility-administered encounter medications on file as of 04/18/2015.    Allergies  Allergen Reactions  .  Aspirin Swelling  . Compazine [Prochlorperazine Edisylate]     Slurred speech   . Pseudophed-Chlophedianol-Gg     Side effects  . Seroquel [Quetiapine Fumarate] Swelling  . Latex Rash  . Tape Rash    adhesive    Family History  Problem Relation Age of Onset  . COPD Father   . Asthma    . Cancer Mother   . Cancer Sister     History   Social History  . Marital Status: Single    Spouse Name: N/A  . Number of  Children: y  . Years of Education: N/A   Occupational History  . retired    Social History Main Topics  . Smoking status: Current Some Day Smoker -- 55 years  . Smokeless tobacco: Not on file     Comment: socially-can go months w/o smoking and uses e cig  . Alcohol Use: 0.0 oz/week    0 Standard drinks or equivalent per week     Comment: rare  . Drug Use: No  . Sexual Activity: Not on file   Other Topics Concern  . Not on file   Social History Narrative   Caffeine 2 cups daily, Single, 2 kids, retired.    Current Medications, Allergies, Past Medical History, Past Surgical History, Family History, and Social History were reviewed in [employer] record.   Review of Systems            All symptoms NEG except where BOLDED >>  Constitutional:  F/C/S, fatigue, anorexia, unexpected weight change. HEENT:  HA, visual changes, hearing loss, earache, nasal symptoms, sore throat, mouth sores, hoarseness. Resp:  cough, sputum, hemoptysis; SOB, tightness, wheezing. Cardio:  CP, palpit, DOE, orthopnea, edema. GI:  N/V/D/C, blood in stool; reflux, abd pain, distention, gas. GU:  dysuria, freq, urgency, hematuria, flank pain, voiding difficulty. MS:  joint pain, swelling, tenderness, decr ROM; neck pain, back pain, etc. Neuro:  HA, tremors, seizures, dizziness, syncope, weakness, numbness, gait abn. Skin:  suspicious lesions or skin rash. Heme:  adenopathy, bruising, bleeding. Psyche:  confusion, agitation, sleep disturbance, hallucinations,  anxiety, depression suicidal.   Objective:   Physical Exam      Vital Signs:  Reviewed...  General:  WD, WN, 69 y/o WF in NAD; alert & oriented; pleasant & cooperative... HEENT:  [place]/AT; Conjunctiva- pink, Sclera- nonicteric, EOM-wnl, PERRLA, EACs-clear, TMs-wnl; NOSE-clear; THROAT- dentures, neg w/o lesions seen. Neck:  Supple w/ fair ROM; no JVD; normal carotid impulses w/o bruits; no thyromegaly or nodules palpated; no lymphadenopathy. Chest:  decr BS bilat without wheezes, rales, or rhonchi heard. Heart:  Regular Rhythm; norm S1 & S2 without murmurs, rubs, or gallops detected. Abdomen:  Soft & nontender- no guarding or rebound; normal bowel sounds; no organomegaly or masses palpated. Ext:  Normal ROM; without deformities or arthritic changes; no varicose veins, venous insuffic, or edema;  Pulses intact w/o bruits. Neuro:  No focal neuro deficit; gait normal & balance OK. Derm:  No lesions noted; no rash etc. Lymph:  No cervical, supraclavicular, axillary, or inguinal adenopathy palpated.   Assessment:      1)  Mod COPD/emphysema w/ small reversible component-- on Stiolto-2sp per day + AlbutHFA rescue inhaler... 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 Other medical issues>>  Hx GERD Hx DJD Hx Bipolar Hx skin cancers   Jenny has mod COPD/emphysema & a small reversible component; she needs to quit all smoking & use the Stiolto regularly, plus her prn rescue inhaler;  She has been found to have nocturnal hypoxemia but no signif OSA at this time- we will proceed w/ her ONO to see if she needs nocturnal oxygen...     Plan:     Patient's Medications  New Prescriptions   TIOTROPIUM BROMIDE-OLODATEROL (STIOLTO RESPIMAT) 2.5-2.5 MCG/ACT AERS    Inhale 2 puffs into the lungs daily.  Previous Medications   ALBUTEROL SULFATE (PROAIR RESPICLICK) 347 (90 BASE)  MCG/ACT AEPB    Inhale 2 puffs into the lungs every 6 (six) hours as needed.   B COMPLEX VITAMINS TABLET    Bid po OTC   CETIRIZINE (ZYRTEC) 10 MG TABLET    Take 10 mg by mouth daily as needed for allergies.   CHANTIX STARTING MONTH PAK 0.5 MG X 11 & 1 MG X 42 TABLET    See admin instructions.   FLUOXETINE (PROZAC) 20 MG CAPSULE    Take 20 mg by mouth.   FLUTICASONE (FLONASE) 50 MCG/ACT NASAL SPRAY    Place 2 sprays into both nostrils daily.   GLUCOSA-CHONDR-NA CHONDR-MSM PO    Take by mouth daily. W/ vit d   OMEPRAZOLE (PRILOSEC) 20 MG CAPSULE    Take 20 mg by mouth daily.    PROBIOTIC PRODUCT (PROBIOTIC & ACIDOPHILUS EX ST PO)    Take 1 tablet by mouth daily.   TIOTROPIUM BROMIDE-OLODATEROL (STIOLTO RESPIMAT) 2.5-2.5 MCG/ACT AERS    Inhale 2 puffs into the lungs daily.  Modified Medications   No medications on file  Discontinued Medications   AMOXICILLIN-CLAVULANATE (AUGMENTIN) 875-125 MG PER TABLET    2 (two) times daily.

## 2015-05-30 DIAGNOSIS — N390 Urinary tract infection, site not specified: Secondary | ICD-10-CM | POA: Diagnosis not present

## 2015-06-15 DIAGNOSIS — G4734 Idiopathic sleep related nonobstructive alveolar hypoventilation: Secondary | ICD-10-CM | POA: Diagnosis not present

## 2015-06-21 DIAGNOSIS — N949 Unspecified condition associated with female genital organs and menstrual cycle: Secondary | ICD-10-CM | POA: Diagnosis not present

## 2015-06-21 DIAGNOSIS — R3915 Urgency of urination: Secondary | ICD-10-CM | POA: Diagnosis not present

## 2015-06-23 ENCOUNTER — Telehealth: Payer: Self-pay | Admitting: Pulmonary Disease

## 2015-06-23 DIAGNOSIS — G4734 Idiopathic sleep related nonobstructive alveolar hypoventilation: Secondary | ICD-10-CM

## 2015-06-23 NOTE — Telephone Encounter (Signed)
Per SN, Please inform pt that O2 sats did drop throughout the night based on her latest ONO results from [facility] pt to start nocturnal O2 at 2L   Pt called and advised of the above rec Pt voiced understanding of results and no questions were asked at this time  Order placed ONO results placed in SN scan folder  Nothing further is needed

## 2015-07-04 ENCOUNTER — Encounter: Payer: Self-pay | Admitting: Pulmonary Disease

## 2015-07-04 ENCOUNTER — Ambulatory Visit (INDEPENDENT_AMBULATORY_CARE_PROVIDER_SITE_OTHER): Payer: Medicare Other | Admitting: Pulmonary Disease

## 2015-07-04 ENCOUNTER — Ambulatory Visit: Payer: Medicare Other | Admitting: Pulmonary Disease

## 2015-07-04 VITALS — BP 128/76 | HR 89 | Temp 97.0°F | Wt 189.2 lb

## 2015-07-04 DIAGNOSIS — G4734 Idiopathic sleep related nonobstructive alveolar hypoventilation: Secondary | ICD-10-CM | POA: Diagnosis not present

## 2015-07-04 DIAGNOSIS — J432 Centrilobular emphysema: Secondary | ICD-10-CM | POA: Diagnosis not present

## 2015-07-04 DIAGNOSIS — C029 Malignant neoplasm of tongue, unspecified: Secondary | ICD-10-CM | POA: Diagnosis not present

## 2015-07-04 DIAGNOSIS — F17210 Nicotine dependence, cigarettes, uncomplicated: Secondary | ICD-10-CM | POA: Insufficient documentation

## 2015-07-04 DIAGNOSIS — Z23 Encounter for immunization: Secondary | ICD-10-CM

## 2015-07-04 MED ORDER — VARENICLINE TARTRATE 1 MG PO TABS: 1.0000 mg | ORAL_TABLET | Freq: Two times a day (BID) | ORAL | Status: DC

## 2015-07-04 NOTE — Progress Notes (Signed)
Subjective:     Patient ID: Lori Day, female   DOB: 1945/11/08, 69 y.o.   MRN: [id number]  HPI ~  April 18, 2015:  Initial visit w/ SN>  Her PCP is Animal nutritionist at [facility], [place]...      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       She relates a hx going back to 2009 in [place]- she had a sleep study- ?results, but she was started on CPAP; she subseq lost weight & improved so she stopped the CPAP on her own;  She was later re-tested and was said to have improved but she doesn't know the details;  Then in 2013 she had another abn sleep test & her physician restarted her CPAP (this was before her tongue cancer Dx);  She again stopped the CPAP in 2013 w/ the tongue cancer dx & treatment regimen of XRT & Chemo;  Then in 2015 she had a neuro eval by DrDohmeier (pt c/o pain & numbness in extremities, hx of pinched nerve, hx CTS, MRI neck w/ foraminal narrowing C5-C6) which included a home sleep study reported neg for OSA but she had nocturnal hypoxemia down to 70% sat she says and pt was referred to DrClance...       She saw DrClance 02/2015 & he reviewed her 08/2014 home sleep test- AHI=3.9 events/hr, lowest O2sat=82% (2hrs below 88%); she also has a hx COPD prev followed by a pulmonologist in [place]; still smoking as noted above; hx DOE w/ stairs and inclines or if rushing on level ground; denied much cough or sput; CXR showed COPD, NAD, and PFTs showed  mod airflow obstruction w/ decr DLCO (59%) and a reversible component;  He tried her on Stiolto- 2sp daily which she has taken over the last month- "about the same" she says (but still smoking)...      Current Meds>  Stiolto Respimat- 2 sprays once daily; Albuterol-HFA rescue inhaler prn, Chantix starter pack per her PCP, Zyrtek10/ Flonase Qhs...       EXAM reveals Afeb, VSS, O2sat=95% on RA at rest;  HEENT- denture, no lesions seen, mallampati2;  Chest- decr BS bilat w/o w/r/r;  Heart- RR w/o m/r/g;  Ext- neg w/o c/c/e...   CXR 03/15/15 showed norm heart size, COPD/emphysema, min atx/scarring at bases, NAD...  PFTs 03/16/15 showed FVC=3.26 (89%), FEV1=1.98 (71%), %1sec=61, mid-flows reduced at 43% predicted; after bronchodil FEV1 improved 16%; Lung volumes wnl; DLCO reduced at 59%... This is c/u mod airflow obstruction, emphysema, and a surprising reversible component...  ONO before considering nocturnal oxygen (Medicare doesn't accept oximetry from sleep studies)> pending=> O2sat<88% of 5h of the 7h study. IMP/PLAN>>  Orpah has mod COPD/emphysema & a small reversible component; she needs to quit all smoking & use the Stiolto regularly, plus her prn rescue inhaler;  She has been found to have nocturnal hypoxemia but no signif  OSA at this time- we will proceed w/ her ONO to see if she needs nocturnal oxygen... ADDENDUM>>  ONO performed 06/15/15 w/ O2sat<88% for almost 5h of the total 7h study (there were 69 events or 9/hr;  She needs O2 at 2L/min Qhs and MUST QUIT ALL SMOKING!  ~  July 04, 2015:  2-67moROV w/ SN>  KLovellareports that she is improved on the [employer]& hasn't needed her rescue inhaler in some time; she was able to quit smoking transiently w/ the help of Chantix but unfortunately she has restarted the smoking- now back to 1ppd; she understands the importance of smoking cessation in her overall health picture & recovery; we wrote for the Chantix maintenance packs...     Mod  COPD/emphysema w/ small reversible component> on Stiolto-2sp per day + AlbutHFA rescue inhaler; states she is improved on the Stiolto & hasn't needed the rescue inhaler recently; rec to continue the same...    Nocturnal hypoxemia> on O2 at 2L/min Qhs; several sleep tests w/o signif OSA...    Current cigarette smoker> she reports that Chantix helped her quit, was using e-cig, then went on a cruise & started cigarettes again- now back to 1ppd and "life is good" she says; we refilled the Chantix Rx for her...    Hx cancer at base of tongue> s/p XRT, chemoRx in [place] (she was HPV pos); rec to try Biotene for dry mouth    Other medical issues>> Hx GERD, Hx DJD, Hx Bipolar, Hx skin cancers... EXAM reveals Afeb, VSS, O2sat=92% on RA at rest;  HEENT- denture, no lesions seen, mallampati2;  Chest- decr BS bilat w/o w/r/r;  Heart- RR w/o m/r/g;  Ext- neg w/o c/c/e...  We reviewed prob list, meds, xrays and labs>> Given the 2016 Flu vaccine today; home O2 supplied by [facility]... IMP/PLAN>>  KAmisadaimust quit all smoking & stay quit; rec to continue the Stiolto, use Albut rescue inhaler as needed; use the nocturnal O2 at 2L/min Qhs; get on diet & increase exercise program... She will f/u [place] if needed.    Past Medical History  Diagnosis Date    COPD/emphsema, cig smoker >>      Hx OSA, nocturnal hypoxemia >>    . 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 Thyroid nodule >> states she had eval & f/u in [place], Bx=neg   . Hx of chronic sinusitis   . GERD (gastroesophageal reflux disease) >> on Omeprazole20   . Bipolar 1 disorder >> she reports off all meds x PROZAC20   . 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 Hiatal hernia   . History of HPV infection   . Glaucoma   . Sciatica   . Sensory hearing loss, bilateral   .  Sjogren's syndrome 09-02-2007  . 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 Skin cancer w/ Moh's surg on right side of nose     Past Surgical History  Procedure Laterality Date  . Turbinate surgery      [facility] . Cataract extraction Bilateral 05-29-10, 06-14-10  . Knee surgery  08-15-11    arthoscopy  . Tongue cancer    . Appendectomy    . Tubal ligation  1981  . Abdominal hysterectomy  Outpatient Encounter Prescriptions as of 07/04/2015  Medication Sig  . Albuterol Sulfate (PROAIR RESPICLICK) 299 (90 BASE) MCG/ACT AEPB Inhale 2 puffs into the lungs every 6 (six) hours as needed.  Marland Kitchen b complex vitamins tablet Bid po OTC  . cetirizine (ZYRTEC) 10 MG tablet Take 10 mg by mouth daily as needed for allergies.  . CHANTIX STARTING MONTH PAK 0.5 MG X 11 & 1 MG X 42 tablet See admin instructions.  Marland Kitchen FLUoxetine (PROZAC) 20 MG capsule Take 20 mg by mouth.  . fluticasone (FLONASE) 50 MCG/ACT nasal spray Place 2 sprays into both nostrils daily.  Marland Kitchen GLUCOSA-CHONDR-NA CHONDR-MSM PO Take by mouth daily. W/ vit d  . omeprazole (PRILOSEC) 20 MG capsule Take 20 mg by mouth daily.   . Probiotic Product (PROBIOTIC & ACIDOPHILUS EX ST PO) Take 1 tablet by mouth daily.  . Tiotropium Bromide-Olodaterol (STIOLTO RESPIMAT) 2.5-2.5 MCG/ACT AERS Inhale 2 puffs into the lungs daily.  . Tiotropium Bromide-Olodaterol (STIOLTO RESPIMAT) 2.5-2.5 MCG/ACT AERS Inhale 2 puffs into the lungs daily.   No facility-administered encounter medications on file as of 07/04/2015.    Allergies  Allergen Reactions  . Aspirin Swelling  . Compazine [Prochlorperazine Edisylate]     Slurred speech   . Pseudophed-Chlophedianol-Gg     Side effects  . Seroquel [Quetiapine Fumarate] Swelling  . Latex Rash  . Tape Rash    adhesive    Current Medications, Allergies, Past Medical History, Past Surgical History, Family History, and Social History were reviewed in [employer] [facility] record.   Review  of Systems            All symptoms NEG except where BOLDED >>  Constitutional:  F/C/S, fatigue, anorexia, unexpected weight change. HEENT:  HA, visual changes, hearing loss, earache, nasal symptoms, sore throat, mouth sores, hoarseness. Resp:  cough, sputum, hemoptysis; SOB, tightness, wheezing. Cardio:  CP, palpit, DOE, orthopnea, edema. GI:  N/V/D/C, blood in stool; reflux, abd pain, distention, gas. GU:  dysuria, freq, urgency, hematuria, flank pain, voiding difficulty. MS:  joint pain, swelling, tenderness, decr ROM; neck pain, back pain, etc. Neuro:  HA, tremors, seizures, dizziness, syncope, weakness, numbness, gait abn. Skin:  suspicious lesions or skin rash. Heme:  adenopathy, bruising, bleeding. Psyche:  confusion, agitation, sleep disturbance, hallucinations, anxiety, depression suicidal.   Objective:   Physical Exam      Vital Signs:  Reviewed...  General:  WD, WN, 69 y/o WF in NAD; alert & oriented; pleasant & cooperative... HEENT:  [place]/AT; Conjunctiva- pink, Sclera- nonicteric, EOM-wnl, PERRLA, EACs-clear, TMs-wnl; NOSE-clear; THROAT- dentures, neg w/o lesions seen. Neck:  Supple w/ fair ROM; no JVD; normal carotid impulses w/o bruits; no thyromegaly or nodules palpated; no lymphadenopathy. Chest:  decr BS bilat without wheezes, rales, or rhonchi heard. Heart:  Regular Rhythm; norm S1 & S2 without murmurs, rubs, or gallops detected. Abdomen:  Soft & nontender- no guarding or rebound; normal bowel sounds; no organomegaly or masses palpated. Ext:  Normal ROM; without deformities or arthritic changes; no varicose veins, venous insuffic, or edema;  Pulses intact w/o bruits. Neuro:  No focal neuro deficit; gait normal & balance OK. Derm:  No lesions noted; no rash etc. Lymph:  No cervical, supraclavicular, axillary, or inguinal adenopathy palpated.   Assessment:      IMP >>  1)  Mod COPD/emphysema w/ small reversible component-- on Stiolto-2sp per day + AlbutHFA rescue  inhaler... 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 Other medical issues>>  Hx GERD Hx DJD Hx Bipolar Hx skin cancers   PLAN >>  6/21>  Nixon has mod COPD/emphysema & a small reversible component; she needs to quit all smoking & use the Stiolto regularly, plus her prn rescue inhaler;  She has been found to have nocturnal hypoxemia but no signif OSA at this time- we will proceed w/ her ONO to see if she needs nocturnal oxygen... 9/6>  Rosia must quit all smoking & stay quit; rec to continue the Stiolto, use Albut rescue inhaler as needed; use the nocturnal O2 at 2L/min Qhs; get on diet & increase exercise program... She will f/u in 4mo sooner if needed.      Plan:     Patient's Medications  New Prescriptions   VARENICLINE (CHANTIX CONTINUING MONTH PAK) 1 MG TABLET    Take 1 tablet (1 mg total) by mouth 2 (two) times daily.  Previous Medications   ALBUTEROL SULFATE (PROAIR RESPICLICK) 1366(90 BASE) MCG/ACT AEPB    Inhale 2 puffs into the lungs every 6 (six) hours as needed.   B COMPLEX VITAMINS TABLET    Bid po OTC   CETIRIZINE (ZYRTEC) 10 MG TABLET    Take 10 mg by mouth daily as needed for allergies.   CHANTIX STARTING MONTH PAK 0.5 MG X 11 & 1 MG X 42 TABLET    See admin instructions.   FLUOXETINE (PROZAC) 20 MG CAPSULE    Take 20 mg by mouth.   FLUTICASONE (FLONASE) 50 MCG/ACT NASAL SPRAY    Place 2 sprays into both nostrils daily.   GLUCOSA-CHONDR-NA CHONDR-MSM PO    Take by mouth daily. W/ vit d   OMEPRAZOLE (PRILOSEC) 20 MG CAPSULE    Take 20 mg by mouth daily.    PROBIOTIC PRODUCT (PROBIOTIC & ACIDOPHILUS EX ST PO)    Take 1 tablet by mouth daily.   TIOTROPIUM BROMIDE-OLODATEROL (STIOLTO RESPIMAT) 2.5-2.5 MCG/ACT AERS    Inhale 2 puffs into the lungs daily.   TIOTROPIUM BROMIDE-OLODATEROL (STIOLTO RESPIMAT) 2.5-2.5 MCG/ACT AERS    Inhale 2 puffs into  the lungs daily.  Modified Medications   No medications on file  Discontinued Medications   No medications on file

## 2015-07-04 NOTE — Patient Instructions (Signed)
Today we updated your med list in our EPIC system...    Continue your current medications the same...  For your dry mouth>    We will make sure that [facility] adds a bubble humidifier to your home oxygen machine...    During the day> use lozenges, lemon drops, sugarless gum, etc...    Try the BIOTENE products...    Keep a water bottle handy to sip frequently...  We refilled your CHANTIX today- YOU CAN DO IT!!!  We gave you the 2016 Flu vaccine....  Call for any questions...  Let's plan a follow up visit in 53mo sooner if needed for problems..Marland KitchenMarland Kitchen

## 2015-07-07 ENCOUNTER — Encounter: Payer: Self-pay | Admitting: Pulmonary Disease

## 2015-07-10 ENCOUNTER — Other Ambulatory Visit (HOSPITAL_COMMUNITY)
Admission: RE | Admit: 2015-07-10 | Discharge: 2015-07-10 | Disposition: A | Discharge status: Home / Self Care | Payer: Medicare Other | Source: Ambulatory Visit | Attending: Obstetrics & Gynecology | Admitting: Obstetrics & Gynecology

## 2015-07-10 ENCOUNTER — Other Ambulatory Visit: Payer: Self-pay | Admitting: Obstetrics & Gynecology

## 2015-07-10 DIAGNOSIS — Z1151 Encounter for screening for human papillomavirus (HPV): Secondary | ICD-10-CM | POA: Insufficient documentation

## 2015-07-10 DIAGNOSIS — Z124 Encounter for screening for malignant neoplasm of cervix: Secondary | ICD-10-CM | POA: Diagnosis not present

## 2015-07-10 DIAGNOSIS — Z85810 Personal history of malignant neoplasm of tongue: Secondary | ICD-10-CM | POA: Diagnosis not present

## 2015-07-10 DIAGNOSIS — N920 Excessive and frequent menstruation with regular cycle: Secondary | ICD-10-CM | POA: Diagnosis not present

## 2015-07-10 DIAGNOSIS — Z90711 Acquired absence of uterus with remaining cervical stump: Secondary | ICD-10-CM | POA: Diagnosis not present

## 2015-07-10 DIAGNOSIS — Z8619 Personal history of other infectious and parasitic diseases: Secondary | ICD-10-CM | POA: Diagnosis not present

## 2015-07-11 LAB — CYTOLOGY - PAP

## 2015-07-12 ENCOUNTER — Telehealth: Payer: Self-pay | Admitting: Pulmonary Disease

## 2015-07-12 DIAGNOSIS — E041 Nontoxic single thyroid nodule: Secondary | ICD-10-CM | POA: Diagnosis not present

## 2015-07-12 NOTE — Telephone Encounter (Signed)
Called and spoke to [place]. [facility] is needing to submit the pt's OV note to insurance and is requesting Dr. Lenna Gilford close the pt's OV note from 9.6.2016.   Will send to Dr. Lenna Gilford as Juluis Rainier.

## 2015-07-13 ENCOUNTER — Other Ambulatory Visit (HOSPITAL_COMMUNITY): Payer: Self-pay | Admitting: Endocrinology

## 2015-07-13 DIAGNOSIS — E041 Nontoxic single thyroid nodule: Secondary | ICD-10-CM

## 2015-07-19 ENCOUNTER — Ambulatory Visit: Payer: Medicare Other | Admitting: Pulmonary Disease

## 2015-07-19 ENCOUNTER — Ambulatory Visit (HOSPITAL_COMMUNITY)
Admission: RE | Admit: 2015-07-19 | Discharge: 2015-07-19 | Disposition: A | Discharge status: Home / Self Care | Payer: Medicare Other | Source: Ambulatory Visit | Attending: Endocrinology | Admitting: Endocrinology

## 2015-07-19 DIAGNOSIS — E041 Nontoxic single thyroid nodule: Secondary | ICD-10-CM | POA: Insufficient documentation

## 2015-07-19 MED ORDER — SODIUM PERTECHNETATE TC 99M INJECTION
10.0000 | Freq: Once | INTRAVENOUS | Status: AC | PRN
  Administered 2015-07-19: 10.78 via INTRAVENOUS

## 2015-07-31 ENCOUNTER — Telehealth: Payer: Self-pay | Admitting: Pulmonary Disease

## 2015-07-31 NOTE — Telephone Encounter (Signed)
Left detailed message on Lori Day's VM that we will let SN know about closing OV note from 9.6.2016 so note can be sent to medicare.   Dr. Lenna Gilford, please advise. Thanks.

## 2015-08-01 NOTE — Telephone Encounter (Signed)
Provider aware that note needs to be closed per [employer]

## 2015-08-09 DIAGNOSIS — R1312 Dysphagia, oropharyngeal phase: Secondary | ICD-10-CM | POA: Diagnosis not present

## 2015-08-09 DIAGNOSIS — C01 Malignant neoplasm of base of tongue: Secondary | ICD-10-CM | POA: Diagnosis not present

## 2015-08-16 DIAGNOSIS — R432 Parageusia: Secondary | ICD-10-CM | POA: Diagnosis not present

## 2015-08-16 DIAGNOSIS — R001 Bradycardia, unspecified: Secondary | ICD-10-CM | POA: Diagnosis not present

## 2015-08-16 DIAGNOSIS — K117 Disturbances of salivary secretion: Secondary | ICD-10-CM | POA: Diagnosis not present

## 2015-08-16 DIAGNOSIS — Z01818 Encounter for other preprocedural examination: Secondary | ICD-10-CM | POA: Diagnosis not present

## 2015-08-16 DIAGNOSIS — R1312 Dysphagia, oropharyngeal phase: Secondary | ICD-10-CM | POA: Diagnosis not present

## 2015-08-16 DIAGNOSIS — C01 Malignant neoplasm of base of tongue: Secondary | ICD-10-CM | POA: Diagnosis not present

## 2015-08-28 DIAGNOSIS — Z85810 Personal history of malignant neoplasm of tongue: Secondary | ICD-10-CM | POA: Diagnosis not present

## 2015-08-28 DIAGNOSIS — J449 Chronic obstructive pulmonary disease, unspecified: Secondary | ICD-10-CM | POA: Diagnosis not present

## 2015-08-28 DIAGNOSIS — Z91040 Latex allergy status: Secondary | ICD-10-CM | POA: Diagnosis not present

## 2015-08-28 DIAGNOSIS — Z888 Allergy status to other drugs, medicaments and biological substances status: Secondary | ICD-10-CM | POA: Diagnosis not present

## 2015-08-28 DIAGNOSIS — F329 Major depressive disorder, single episode, unspecified: Secondary | ICD-10-CM | POA: Diagnosis not present

## 2015-08-28 DIAGNOSIS — Z885 Allergy status to narcotic agent status: Secondary | ICD-10-CM | POA: Diagnosis not present

## 2015-08-28 DIAGNOSIS — F319 Bipolar disorder, unspecified: Secondary | ICD-10-CM | POA: Diagnosis not present

## 2015-08-28 DIAGNOSIS — H0289 Other specified disorders of eyelid: Secondary | ICD-10-CM | POA: Diagnosis not present

## 2015-08-28 DIAGNOSIS — L821 Other seborrheic keratosis: Secondary | ICD-10-CM | POA: Diagnosis not present

## 2015-08-28 DIAGNOSIS — E042 Nontoxic multinodular goiter: Secondary | ICD-10-CM | POA: Diagnosis not present

## 2015-08-28 DIAGNOSIS — Z886 Allergy status to analgesic agent status: Secondary | ICD-10-CM | POA: Diagnosis not present

## 2015-08-28 DIAGNOSIS — Z923 Personal history of irradiation: Secondary | ICD-10-CM | POA: Diagnosis not present

## 2015-08-28 DIAGNOSIS — Z87891 Personal history of nicotine dependence: Secondary | ICD-10-CM | POA: Diagnosis not present

## 2015-08-28 DIAGNOSIS — Z9221 Personal history of antineoplastic chemotherapy: Secondary | ICD-10-CM | POA: Diagnosis not present

## 2015-08-28 DIAGNOSIS — G527 Disorders of multiple cranial nerves: Secondary | ICD-10-CM | POA: Diagnosis not present

## 2015-08-28 DIAGNOSIS — G529 Cranial nerve disorder, unspecified: Secondary | ICD-10-CM | POA: Diagnosis not present

## 2015-08-28 DIAGNOSIS — D440 Neoplasm of uncertain behavior of thyroid gland: Secondary | ICD-10-CM | POA: Diagnosis not present

## 2015-08-29 DIAGNOSIS — E042 Nontoxic multinodular goiter: Secondary | ICD-10-CM | POA: Diagnosis not present

## 2015-08-29 DIAGNOSIS — J449 Chronic obstructive pulmonary disease, unspecified: Secondary | ICD-10-CM | POA: Diagnosis not present

## 2015-08-29 DIAGNOSIS — F329 Major depressive disorder, single episode, unspecified: Secondary | ICD-10-CM | POA: Diagnosis not present

## 2015-08-29 DIAGNOSIS — Z85810 Personal history of malignant neoplasm of tongue: Secondary | ICD-10-CM | POA: Diagnosis not present

## 2015-08-29 DIAGNOSIS — Z87891 Personal history of nicotine dependence: Secondary | ICD-10-CM | POA: Diagnosis not present

## 2015-08-29 DIAGNOSIS — F319 Bipolar disorder, unspecified: Secondary | ICD-10-CM | POA: Diagnosis not present

## 2015-09-04 ENCOUNTER — Encounter: Payer: Self-pay | Admitting: Occupational Therapy

## 2015-09-04 NOTE — Therapy (Signed)
[facility] [address] [place], [place], [address] Phone: [phone]   Fax:  [phone]  Patient Details  Name: Josaphine Shimamoto MRN: [id number] Date of Birth: 1946-04-09 Referring Provider:  No ref. provider found  Encounter Date: n/a-did not return  [facility] SUMMARY  Visits from Start of Care: 4  Current functional level related to goals / functional outcomes:     OT Short Term Goals - 10/07/14 1301    OT SHORT TERM GOAL #1   Title Pt will be Mod I home program for 1st dorsal compartment stretching Right   Time 4   Period Weeks   Status On-going   OT SHORT TERM GOAL #2   Title Pt will report pain right wrist as 3 or less out of 10 during handwriting task.   Time 4   Period Weeks   Status On-going   OT SHORT TERM GOAL #3   Title Pt will independently demonstrate 2-3 ADL's using neutral wrist positiong to avoid stress on CMC/1st dorsal compartment.   Time 4   Period Weeks   Status On-going   OT SHORT TERM GOAL #4   Title Pt will be Mod I right hand splint use, care and precautions    Time 4   Period Weeks   Status Achieved         OT Long Term Goals - 10/04/14 1216    OT LONG TERM GOAL #1   Title Pt will be Mod I upgraded HEP right UE   Time 8   Period Weeks   Status New   OT LONG TERM GOAL #2   Title Pt will be Mod I ADL's implementing neutral wrist during functional tasks.   Time 8   Period Weeks   Status New   OT LONG TERM GOAL #3   Title Pt will improve R hand grip strength by 5# or greater w/o increased c/o pain for ADL's   Time 8   Period Weeks   Status New   OT LONG TERM GOAL #4   Title Pt will be Mod I a/e use bilateral UE's PRN for decreased stress on joints/Mod I implementing joint protection techniques.   Time 8   Period Weeks   Status New        Remaining deficits: See initial eval-patient did not return   Education / Equipment: Splint wear/care,  home program.  Plan: Patient agrees to discharge.  Patient goals were not met. Patient is being discharged due to not returning since the last visit.  ?????        Josephine Igo Dixon,OTR/L 09/04/2015, 12:15 PM  [facility] [address] [place], [place], [address] Phone: [phone]   Fax:  [phone]

## 2015-09-06 DIAGNOSIS — C01 Malignant neoplasm of base of tongue: Secondary | ICD-10-CM | POA: Diagnosis not present

## 2015-09-06 DIAGNOSIS — K117 Disturbances of salivary secretion: Secondary | ICD-10-CM | POA: Diagnosis not present

## 2015-09-06 DIAGNOSIS — R1312 Dysphagia, oropharyngeal phase: Secondary | ICD-10-CM | POA: Diagnosis not present

## 2015-09-06 DIAGNOSIS — R432 Parageusia: Secondary | ICD-10-CM | POA: Diagnosis not present

## 2015-11-01 DIAGNOSIS — N390 Urinary tract infection, site not specified: Secondary | ICD-10-CM | POA: Diagnosis not present

## 2015-11-02 DIAGNOSIS — S335XXA Sprain of ligaments of lumbar spine, initial encounter: Secondary | ICD-10-CM | POA: Diagnosis not present

## 2015-11-02 DIAGNOSIS — M545 Low back pain: Secondary | ICD-10-CM | POA: Diagnosis not present

## 2015-11-06 DIAGNOSIS — B029 Zoster without complications: Secondary | ICD-10-CM | POA: Diagnosis not present

## 2015-11-28 DIAGNOSIS — B029 Zoster without complications: Secondary | ICD-10-CM | POA: Diagnosis not present

## 2015-11-28 DIAGNOSIS — Z8041 Family history of malignant neoplasm of ovary: Secondary | ICD-10-CM | POA: Diagnosis not present

## 2015-11-28 DIAGNOSIS — Z8639 Personal history of other endocrine, nutritional and metabolic disease: Secondary | ICD-10-CM | POA: Diagnosis not present

## 2015-11-28 DIAGNOSIS — C029 Malignant neoplasm of tongue, unspecified: Secondary | ICD-10-CM | POA: Diagnosis not present

## 2015-11-29 ENCOUNTER — Telehealth: Payer: Self-pay | Admitting: Genetic Counselor

## 2015-11-29 NOTE — Telephone Encounter (Signed)
Pt confirmed appt for 2/14 at 12:30

## 2015-12-07 ENCOUNTER — Telehealth: Payer: Self-pay | Admitting: Genetic Counselor

## 2015-12-07 NOTE — Telephone Encounter (Signed)
Ms. Engert is just calling to confirm her appt on 2/14 at 1 PM.  Advised her to arrive about 20 mins early to the [facility] to check in.  She will do so.

## 2015-12-08 DIAGNOSIS — H40011 Open angle with borderline findings, low risk, right eye: Secondary | ICD-10-CM | POA: Diagnosis not present

## 2015-12-08 DIAGNOSIS — H40012 Open angle with borderline findings, low risk, left eye: Secondary | ICD-10-CM | POA: Diagnosis not present

## 2015-12-08 DIAGNOSIS — Z0100 Encounter for examination of eyes and vision without abnormal findings: Secondary | ICD-10-CM | POA: Diagnosis not present

## 2015-12-08 DIAGNOSIS — H04123 Dry eye syndrome of bilateral lacrimal glands: Secondary | ICD-10-CM | POA: Diagnosis not present

## 2015-12-11 ENCOUNTER — Telehealth: Payer: Self-pay | Admitting: Genetic Counselor

## 2015-12-11 NOTE — Telephone Encounter (Signed)
Called to ask Ms. Deschene to bring a copy of her sister's positive genetic test result with her to the genetic counseling appt tomorrow.  She has already printed off and will do so.

## 2015-12-12 ENCOUNTER — Ambulatory Visit (HOSPITAL_BASED_OUTPATIENT_CLINIC_OR_DEPARTMENT_OTHER): Payer: Medicare Other | Admitting: Genetic Counselor

## 2015-12-12 ENCOUNTER — Other Ambulatory Visit: Payer: Medicare Other

## 2015-12-12 DIAGNOSIS — Z808 Family history of malignant neoplasm of other organs or systems: Secondary | ICD-10-CM

## 2015-12-12 DIAGNOSIS — Z8041 Family history of malignant neoplasm of ovary: Secondary | ICD-10-CM | POA: Diagnosis not present

## 2015-12-12 DIAGNOSIS — Z85828 Personal history of other malignant neoplasm of skin: Secondary | ICD-10-CM

## 2015-12-12 DIAGNOSIS — Z8481 Family history of carrier of genetic disease: Secondary | ICD-10-CM

## 2015-12-12 DIAGNOSIS — Z803 Family history of malignant neoplasm of breast: Secondary | ICD-10-CM | POA: Diagnosis not present

## 2015-12-12 DIAGNOSIS — Z8042 Family history of malignant neoplasm of prostate: Secondary | ICD-10-CM

## 2015-12-12 DIAGNOSIS — Z800 Family history of malignant neoplasm of digestive organs: Secondary | ICD-10-CM | POA: Diagnosis not present

## 2015-12-12 DIAGNOSIS — C029 Malignant neoplasm of tongue, unspecified: Secondary | ICD-10-CM | POA: Diagnosis not present

## 2015-12-12 DIAGNOSIS — Z315 Encounter for genetic counseling: Secondary | ICD-10-CM

## 2015-12-13 ENCOUNTER — Encounter: Payer: Self-pay | Admitting: Genetic Counselor

## 2015-12-13 ENCOUNTER — Telehealth: Payer: Self-pay | Admitting: Genetic Counselor

## 2015-12-13 DIAGNOSIS — Z803 Family history of malignant neoplasm of breast: Secondary | ICD-10-CM | POA: Insufficient documentation

## 2015-12-13 NOTE — Telephone Encounter (Signed)
Discussed adding the RET gene to our current genetic test selection (32-gene Comprehensive Cancer Panel through [facility]) due to Lori Day's father's history of thyroid cancer at 23.  She agrees with this, so I will make the change.  This will bring the test to 33 genes total and will instead be called a 'Custom Cancer Panel'.  Will still expect results in approximately 2-3 weeks.

## 2015-12-13 NOTE — Progress Notes (Signed)
REFERRING PROVIDER: Marda Stalker, PA-C [place], [place] [address]  PRIMARY PROVIDER:  Marda Stalker, PA-C  PRIMARY REASON FOR VISIT:  1. Cancer of tongue ([facility])   2. Family history of carrier of genetic disease   3. Family history of breast cancer in female   82. Family history of ovarian cancer   5. Family history of pancreatic cancer   6. Family history of prostate cancer   7. Family history of thyroid cancer   8. Personal history of skin cancer      HISTORY OF PRESENT ILLNESS:   Lori Day, a 70 y.o. female, was seen for a [facility] cancer genetics consultation at the request of Marda Stalker, PA-C due to a family history of a known pathogenic BRIP1 gene mutation and family history of breast, ovarian, prostate, pancreatic, and other cancers.  Lori Day presents to clinic today to discuss the possibility of a hereditary predisposition to cancer, genetic testing, and to further clarify her future cancer risks, as well as potential cancer risks for family members.   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 This was treated with chemotherapy and radiation therapy.    CANCER HISTORY:   No history exists.     HORMONAL RISK FACTORS:  Menarche was at age 43-14.  First live birth at age 41.  OCP use for approximately 6 months. Ovaries intact: no.  Hysterectomy: at least a partial hysterectomy (patient not sure) for history of abnormal, prolonged bleeding.  Menopausal status: postmenopausal.  HRT use: 0 years. Colonoscopy: yes; history of 2-3 polyps with almost every colonoscopy (per pt report); currently due for a colonoscopy (last was approx 5-6 years ago in [place], [place]). Mammogram within the last year: no, had series of PET scans, so did not think she needed them during that time; otherwise, previously had mammograms annually Number of breast biopsies: 3; hx of 3 surgeries for  breast calcifications Up to date with pelvic exams:  yes. 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 For short time where she had two bouts of severe pneumonia  Past Medical History  Diagnosis Date  . Squamous cell cancer of tongue ([facility]) 2014    chemo in [place], PET 07-2013  . Thyroid nodule   . Hx of chronic sinusitis   . Tongue cancer ([facility]) 02-02-2013    HPV/ radiation/chemo  . GERD (gastroesophageal reflux disease)   . Bipolar 1 disorder ([facility])   . Arthritis   . Hiatal hernia   . History of HPV infection   . Glaucoma   . Sciatica   . Sensory hearing loss, bilateral   . Sjogren's syndrome ([facility]) 09-02-2007  . Cancer of tongue ([facility]) 09/19/2014    HPV infection, and ex smoker. She quit in 2013.   . 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 Sensory disturbance 09/19/2014    Past Surgical History  Procedure Laterality Date  . Turbinate surgery      Dr. Nicole Cella in [place].  . Cataract extraction Bilateral 05-29-10, 06-14-10  . Knee surgery  08-15-11    arthoscopy  . Tongue cancer    . Appendectomy    . Tubal ligation  1981  . Abdominal hysterectomy      Social History   Social History  . Marital Status:  Single    Spouse Name: N/A  . Number of Children: y  . Years of Education: N/A   Occupational History  . retired    Social History Main Topics  . Smoking status: Current Some Day Smoker -- 1.00 packs/day for 50 years    Types: Cigarettes  . Smokeless tobacco: Never Used     Comment: only occ for last 8 yrs-can go months w/o smoking; on Chantix (10/2015)  . Alcohol Use: 0.0 oz/week    0 Standard drinks or equivalent per week     Comment: rare  . Drug Use: No  . Sexual Activity: Not Asked   Other Topics Concern  . None   Social History Narrative   Caffeine 2 cups daily, Single, 2 kids, retired.     FAMILY HISTORY:  We obtained a detailed, 4-generation  family history.  Significant diagnoses are listed below: Family History  Problem Relation Age of Onset  . COPD Father   . Thyroid cancer Father 9  . Asthma    . Pancreatic cancer Mother 64    no EtOH, not a smoker  . Ovarian cancer Sister 77    ovarian cancer survivor; BRIP1+  . Pancreatic cancer Maternal Grandmother   . Heart attack Maternal Grandfather 77  . Breast cancer Paternal Grandmother 65  . COPD Paternal Grandfather   . Emphysema Paternal Grandfather   . Prostate cancer Maternal Uncle   . Prostate cancer Maternal Uncle   . Breast cancer Cousin     maternal 1st cousin dx. early 12s; negative for familial BRIP1 mutation    Lori Day has one son, age 71, and one daughter, age 11.  She reports that neither have had cancer, but that both have a history of drug abuse.  She is not currently in contact with her daughter.  Her son has two daughters and one son of his own.  Her daughter had one son and one daughter, but they have been adopted by another family.  Lori Day has four full brothers and two full sisters, all of whom are still living and are between the ages of 1-66.  One of Lori Day's sisters, Lori Day, was diagnosed with ovarian cancer at the age of 48 and is now a 10-year survivor.  She had negative BRCA1/2 testing around the time of her diagnosis, but has since had more comprehensive panel testing through [facility] and was found to have a heterozygous pathogenic BRIP1 mutation called "[id number] ([id number])".      Lori Day's father died of COPD at age 35.  He was diagnosed with thyroid cancer at the age of 58.  Lori Day father was an only child.  Lori Day paternal grandmother died in a motor vehicle accident at 5 ge of 22.  She had a history of breast cancer which was diagnosed at age 24.  Ms.  paternal grandfather died of COPD and emphysema at the age of 75.  Lori Day has limited information for her paternal great  aunts/uncles and great grandparents.    Lori Day mother died of pancreatic cancer at age 79.  She did not drink alcohol or smoke.  Lori Day mother has three full sisters and three full brothers, all of whom are still living and are between the ages of 1 and 82.  Two of her brothers have a history of prostate cancer at unspecified ages.  One maternal first cousin, a daughter of an unaffected aunt, has a history of breast cancer which  was diagnosed in her early 62s.  This cousin tested negative for the known familial BRIP1 mutation.  Lori Day is not aware of any other cancers for other maternal first cousins.  Lori Day maternal grandmother died of pancreatic cancer at 71.  Lori Day maternal grandfather died of a heart attack between the ages of 55-78.  Lori Day has limited information for any maternal great aunts/uncles or great grandparents.  No other relatives have yet been tested for the familial BRIP1 mutation.  Patient's maternal ancestors are of [place] descent, and paternal ancestors are of Irish/German descent. There is no reported Ashkenazi Jewish ancestry. There is no known consanguinity.  GENETIC COUNSELING ASSESSMENT: Lori Day is a 70 y.o. female with a family history of a pathogenic BRIP1 mutation and family history of additional breast, prostate, pancreatic, and other cancers which is somewhat suggestive of a hereditary cancer syndrome and predisposition to cancer. We, therefore, discussed and recommended the following at today's visit.   DISCUSSION: We reviewed the characteristics, features and inheritance patterns of hereditary cancer syndromes, particularly those caused by mutations within the BRIP1, BRCA1/2, and PALB2 genes. We also discussed genetic testing, including the appropriate family members to test, the process of testing, insurance coverage and turn-around-time for results. We discussed the implications of a negative, positive and/or variant of  uncertain significant result.  Ms. Dement has up to a 50% chance of also having inherited the known BRIP1 mutation found in her sister.   BRIP1 gene mutations are known to significantly increase ovarian cancer risks, but no other cancer risks are recognized at this time.  There is a significant family history of breast, prostate, pancreatic, and thyroid cancers which are not explained by this mutation.  Thus, we recommend that Ms. Manter have more comprehensive genetic testing.  We recommended Lori Day Massed pursue genetic testing for a 33-gene Custom Cancer Panel through [facility].  This Custom Cancer Panel offered by [facility] includes sequencing and/or deletion duplication testing of the following 33 genes: APC, ATM, AXIN2, BARD1, BMPR1A, BRCA1, BRCA2, BRIP1, CDH1, CDK4, CDKN2A, CHEK2, EPCAM, FANCC, MLH1, MSH2, MSH6, MUTYH, NBN, PALB2, PMS2, POLD1, POLE, PTEN, RAD51C, RAD51D, RET, SCG5/GREM1, SMAD4, STK11, TP53, VHL, and XRCC2.     Based on Ms. Mustapha's personal and family history of cancer, she meets medical criteria for genetic testing. Despite that she meets criteria, she may still have an out of pocket cost. We discussed that if her out of pocket cost for testing is over $100, the laboratory will call and confirm whether she wants to proceed with testing.  If the out of pocket cost of testing is less than $100 she will be billed by the genetic testing laboratory.   Based on the patient's personal and family history, a statistical models (IBIS/Tyrer-Cuzick)  and literature data were used to estimate her risk of developing breast cancer. These estimate her lifetime risk of developing breast cancer to be approximately 4.6% to 5.0%. This estimation does not take into account any genetic testing results.  The patient's lifetime breast cancer risk is a preliminary estimate based on available information using one of several models endorsed by the [place] (ACS). The ACS recommends  consideration of breast MRI screening as an adjunct to mammography for patients at high risk (defined as 20% or greater lifetime risk). A more detailed breast cancer risk assessment can be considered, if clinically indicated.   PLAN: After considering the risks, benefits, and limitations, Ms. Ullman  provided informed consent to pursue genetic  testing and the blood sample was sent to [facility] for analysis of the 33-gene Custom Cancer Panel test. Results should be available within approximately 2-3 weeks' time, at which point they will be disclosed by telephone to Ms. Mcfate, as will any additional recommendations warranted by these results. Ms. Kegg will receive a summary of her genetic counseling visit and a copy of her results once available. This information will also be available in Epic. We encouraged Ms. Osorto to remain in contact with cancer genetics annually so that we can continuously update the family history and inform her of any changes in cancer genetics and testing that may be of benefit for her family. Ms. Skoczylas questions were answered to her satisfaction today. Our contact information was provided should additional questions or concerns arise.  Thank you for the referral and allowing [place] to share in the care of your patient.   Jeanine Luz, MS Genetic Counselor [email or web address] Phone: [phone]  The patient was seen for a total of 60 minutes in face-to-face genetic counseling.  This patient was discussed with Drs. Magrinat, Lindi Adie and/or Burr Medico who agrees with the above.    _______________________________________________________________________ For Office Staff:  Number of people involved in session: 1 Was an Intern/ student involved with case: no

## 2015-12-14 ENCOUNTER — Telehealth: Payer: Self-pay | Admitting: Genetic Counselor

## 2015-12-14 NOTE — Telephone Encounter (Signed)
Lori Day is wondering if there are any genetic testing options for mental illness, specifically for bipolar disorder.  Lori Day has a diagnosis of BPD, as does her son, and she is concerned for her 69 year old granddaughter.  We discussed the lack of genetic testing for mental illness outside of testing for genetic syndromes that also feature mental illness as one of additional clinical features.  However, I also gave her the phone number of an adult genetics genetic counselor at [facility] to speak with her about this more specifically.  We discussed that we recognize that there is a very probable genetic component to mental illness, but that likely multiple genes are involved and that is not something we currently have the capability of testing for.  Discussed her reaching out to a counselor or social worker in [place] for concerns for her granddaughter.  She has my number to contact me with any additional questions/concerns.

## 2015-12-29 ENCOUNTER — Telehealth: Payer: Self-pay | Admitting: Genetic Counselor

## 2015-12-29 ENCOUNTER — Other Ambulatory Visit: Payer: Self-pay | Admitting: Emergency Medicine

## 2015-12-29 MED ORDER — VARENICLINE TARTRATE 1 MG PO TABS: 1.0000 mg | ORAL_TABLET | Freq: Two times a day (BID) | ORAL | Status: DC

## 2015-12-29 NOTE — Telephone Encounter (Signed)
Discussed with [name] that her genetic test results were negative for known pathogenic mutations within any of 33 genes that would cause her to be at an increased risk for breast, ovarian, pancreatic, certain thyroid, and other types of cancer.  Notably, [name] also tested negative for the known familial BRIP1 mutation identified in her sister.  Two variants of uncertain significance were found--one in one copy of the ATM gene, the other in one copy of the POLD1 gene.  Discussed that we treat these results just like negative results and reviewed why we do that.  Hopefully these will be updated by the lab in the future, [name] should keep her phone number up to date with [place], so we can call and let her know if/when that happens.  Discussed that most likely this is a reassuring result for [place], though we still do not have an explanation for some of the family history of cancer.  Encouraged [name] to call and update [place] if anyone else in the family gets diagnosed with cancer.  Also let her know that her other sister and her brothers are all still at a 50% for having inherited the familial BRIP1 mutation, so they are eligible for genetic counseling/genetic testing.  [name] reports that one of her brothers lives in the area, so she will pass my contact info along to him.  Her other siblings live in other areas, so we discussed that they can use the [email or web address] website to find a local cancer genetic counselor.  Also, [name]'s niece and nephew (Lori Day's--her sister with the BRIP1 mutation--children) should have genetic counseling/testing as well.  [name] is happy to receive this news, she knows she is welcome to call or email with any questions.  She would like to have a copy of this test result emailed to her, and I am happy to do that.  I will also send her a letter reviewing this result and our phone conversation.

## 2016-01-01 ENCOUNTER — Encounter: Payer: Self-pay | Admitting: Pulmonary Disease

## 2016-01-01 ENCOUNTER — Ambulatory Visit (INDEPENDENT_AMBULATORY_CARE_PROVIDER_SITE_OTHER): Payer: Medicare Other | Admitting: Pulmonary Disease

## 2016-01-01 ENCOUNTER — Encounter: Payer: Self-pay | Admitting: Genetic Counselor

## 2016-01-01 ENCOUNTER — Ambulatory Visit: Payer: Self-pay | Admitting: Genetic Counselor

## 2016-01-01 VITALS — BP 126/74 | HR 65 | Temp 98.0°F | Ht 68.5 in | Wt 196.0 lb

## 2016-01-01 DIAGNOSIS — Z720 Tobacco use: Secondary | ICD-10-CM | POA: Diagnosis not present

## 2016-01-01 DIAGNOSIS — G4734 Idiopathic sleep related nonobstructive alveolar hypoventilation: Secondary | ICD-10-CM

## 2016-01-01 DIAGNOSIS — F17210 Nicotine dependence, cigarettes, uncomplicated: Secondary | ICD-10-CM

## 2016-01-01 DIAGNOSIS — J432 Centrilobular emphysema: Secondary | ICD-10-CM | POA: Diagnosis not present

## 2016-01-01 DIAGNOSIS — Z809 Family history of malignant neoplasm, unspecified: Secondary | ICD-10-CM

## 2016-01-01 DIAGNOSIS — C029 Malignant neoplasm of tongue, unspecified: Secondary | ICD-10-CM | POA: Diagnosis not present

## 2016-01-01 DIAGNOSIS — Z803 Family history of malignant neoplasm of breast: Secondary | ICD-10-CM

## 2016-01-01 DIAGNOSIS — Z85810 Personal history of malignant neoplasm of tongue: Secondary | ICD-10-CM

## 2016-01-01 DIAGNOSIS — Z1379 Encounter for other screening for genetic and chromosomal anomalies: Secondary | ICD-10-CM

## 2016-01-01 MED ORDER — TIOTROPIUM BROMIDE-OLODATEROL 2.5-2.5 MCG/ACT IN AERS: 2.0000 | INHALATION_SPRAY | Freq: Every day | RESPIRATORY_TRACT | Status: DC

## 2016-01-01 NOTE — Progress Notes (Signed)
GENETIC TEST RESULT  HPI: Ms. Risenhoover was previously seen in the [place] clinic due to a family history of known pathogenic BRIP1 mutation, family history of breast, prostate, pancreatic, early-onset thyroid, and other cancers, personal history of squamous cell carcinoma of her tongue, and concerns regarding a hereditary predisposition to cancer. Please refer to our prior cancer genetics clinic note from December 12, 2015 for more information regarding Ms. Bourbon's medical, social and family histories, and our assessment and recommendations, at the time. Ms. Korf recent genetic test results were disclosed to her, as were recommendations warranted by these results. These results and recommendations are discussed in more detail below.  GENETIC TEST RESULTS: At the time of Ms. Wieland's visit on 12/12/15, we recommended she pursue genetic testing of a 33-gene Custom Cancer Panel through [facility].  This 33-gene Custom Cancer Panel included sequencing and/or deletion/duplication analysis for the following genes: APC, ATM, AXIN2, BARD1, BMPR1A, BRCA1, BRCA2, BRIP1, CDH1, CDK4, CDKN2A, CHEK2, EPCAM, FANCC, MLH1, MSH2, MSH6, MUTYH, NBN, PALB2, PMS2, POLD1, POLE, PTEN, RAD51C, RAD51D, RET, SCG5/GREM1, SMAD4, STK11, TP53, VHL, and XRCC2.  Those results are now back, the report date for which is December 28, 2015.  MSH2 Exons 1-7 Inversion analysis were also negative at [facility].  Date of report is December 26, 2015.  Genetic testing was normal, and did not reveal a deleterious mutation in these genes.  Two variants of uncertain significance (VUSes) were found.  The test report will be scanned into EPIC and will be located under the Results Review tab in the Pathology>Molecular Pathology section.   Genetic testing did identify two variants of uncertain significance (VUSes).  One VUS called "c.7919C>T (p.Thr2640Ile)" was found in one copy of the ATM gene.  Another VUS, called  "c.3016G>A (p.Ala1006Thr)" was found in one copy of the POLD1 gene.  At this time, it is unknown if these VUSes are associated with an increased risk for cancer or if these are normal findings. Since these VUSes are uncertain, they cannot help guide screening recommendations, and family members should not be tested for these VUSes to help define their own cancer risks.  Also, we all have variants within our genes that make [place] unique individuals--most of these variants are benign.  Thus, we treat these VUSes as a negative result.   With time, we suspect the lab will reclassify this variant and when they do, we will try to re-contact Ms. Pavich to discuss the reclassification further.  We also encouraged Ms. Lodato to contact us in a year or two to obtain an update on the status of this VUS.  We discussed with Ms. Brownfield that since the current genetic testing is not perfect, it is possible there may be a gene mutation in one of these genes that current testing cannot detect, but that chance is small. We also discussed, that it is possible that another gene that has not yet been discovered, or that we have not yet tested, is responsible for the cancer diagnoses in the family, and it is, therefore, important to remain in touch with cancer genetics in the future so that we can continue to offer Ms. Dowler the most up to date genetic testing.   Ms. Capek reported a family history of a BRIP1 pathogenic mutation in her sister, Mardene Celeste, and testing did not reveal the familial mutation. We call this result a true negative result because the cancer-causing mutation was identified in Ms. Doleman's family, and she did not inherit it.  Given this negative result, Ms. Mayabb's chances of developing BRIP1-related cancers are the same as they are in the general population.  However, we also chose to perform additional testing beyond simply testing for the known familial mutation, since there is also a family history of other  cancers that are not necessarily explained by the familial BRIP1 mutation.  CANCER SCREENING RECOMMENDATIONS: This normal result is reassuring and indicates that Ms. Notaro does not likely have an increased risk of cancer due to a mutation in one of these genes.  We, therefore, recommended  Ms. Kocak continue to follow the cancer screening guidelines provided by her primary healthcare providers.   RECOMMENDATIONS FOR FAMILY MEMBERS: Women in this family might be at some increased risk of developing cancer, over the general population risk, simply due to the family history of cancer. We recommended women in this family have a yearly mammogram beginning at age 3, or 57 years younger than the earliest onset of cancer, an an annual clinical breast exam, and perform monthly breast self-exams. Women in this family should also have a gynecological exam as recommended by their primary provider. All family members should have a colonoscopy by age 62.  Women in the family who test positive for the known familial BRIP1 mutation are recommended to consider RRSO between ages 68-50y.    Based on Ms. Rudder's family history, we recommended her sister, Peg, consider having genetic counseling and testing for the known familial BRIP1 mutation.  She has a 50% chance of having inherited the same mutation which Fraser Din was found to have.  Ms. Waskey brothers also have a 50% chance for having inherited this mutation, however, there are currently no known BRIP1 cancer risks for men who are carriers.  A positive test result would likely be more helpful, then, for daughters of these brothers and for sons of these brothers who would like to have the information for family planning purposes.  Ms. Moret will let us know if we can be of any assistance in coordinating genetic counseling and/or testing for these family members.  Family members who live in other cities can use the [employer] website to locate cancer genetic counselors by  zip code.     FOLLOW-UP: Lastly, we discussed with Ms. Colello that cancer genetics is a rapidly advancing field and it is possible that new genetic tests will be appropriate for her and/or her family members in the future. We encouraged her to remain in contact with cancer genetics on an annual basis so we can update her personal and family histories and let her know of advances in cancer genetics that may benefit this family.   Our contact number was provided. Ms. Meche questions were answered to her satisfaction, and she knows she is welcome to call us at anytime with additional questions or concerns.   Jeanine Luz, MS, [facility] Certified Genetic Counselor [email or web address] Phone: [phone]

## 2016-01-01 NOTE — Patient Instructions (Signed)
Today we updated your med list in our EPIC system...    Continue your current medications the same...  We refilled your Stiolto today...    Continue 2 puffs once daily...  Stay as active as possible, and QUIT THE SMOKING!!!  Call for any questions...  Let's plan a follow up visit in 4mo sooner if needed for problems..Marland KitchenMarland Kitchen

## 2016-01-01 NOTE — Progress Notes (Signed)
Subjective:     Patient ID: Lori Day, female   DOB: May 18, 1946, 70 y.o.   MRN: [id number]  HPI ~  April 18, 2015:  Initial visit w/ SN>  Her PCP is Animal nutritionist at [facility], [place]...      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       She relates a hx going back to 2009 in [place]- she had a sleep study- ?results, but she was started on CPAP; she subseq lost weight & improved so she stopped the CPAP on her own;  She was later re-tested and was said to have improved but she doesn't know the details;  Then in 2013 she had another abn sleep test & her physician restarted her CPAP (this was before her tongue cancer Dx);  She again stopped the CPAP in 2013 w/ the tongue cancer dx & treatment regimen of XRT & Chemo;  Then in 2015 she had a neuro eval by DrDohmeier (pt c/o pain & numbness in extremities, hx of pinched nerve, hx CTS, MRI neck w/ foraminal narrowing C5-C6) which included a home sleep study reported neg for OSA but she had nocturnal hypoxemia down to 70% sat she says and pt was referred to DrClance...       She saw DrClance 02/2015 & he reviewed her 08/2014 home sleep test- AHI=3.9 events/hr, lowest O2sat=82% (2hrs below 88%); she also has a hx COPD prev followed by a pulmonologist in [place]; still smoking as noted above; hx DOE w/ stairs and inclines or if rushing on level ground; denied much cough or sput; CXR showed COPD, NAD, and PFTs showed  mod airflow obstruction w/ decr DLCO (59%) and a reversible component;  He tried her on Stiolto- 2sp daily which she has taken over the last month- "about the same" she says (but still smoking)...      Current Meds>  Stiolto Respimat- 2 sprays once daily; Albuterol-HFA rescue inhaler prn, Chantix starter pack per her PCP, Zyrtek10/ Flonase Qhs...       EXAM reveals Afeb, VSS, O2sat=95% on RA at rest;  HEENT- denture, no lesions seen, mallampati2;  Chest- decr BS bilat w/o w/r/r;  Heart- RR w/o m/r/g;  Ext- neg w/o c/c/e...   CXR 03/15/15 showed norm heart size, COPD/emphysema, min atx/scarring at bases, NAD...  PFTs 03/16/15 showed FVC=3.26 (89%), FEV1=1.98 (71%), %1sec=61, mid-flows reduced at 43% predicted; after bronchodil FEV1 improved 16%; Lung volumes wnl; DLCO reduced at 59%... This is c/u mod airflow obstruction, emphysema, and a surprising reversible component...  ONO before considering nocturnal oxygen (Medicare doesn't accept oximetry from sleep studies)> pending=> O2sat<88% of 5h of the 7h study. IMP/PLAN>>  Dareth has mod COPD/emphysema & a small reversible component; she needs to quit all smoking & use the Stiolto regularly, plus her prn rescue inhaler;  She has been found to have nocturnal hypoxemia but no signif  OSA at this time- we will proceed w/ her ONO to see if she needs nocturnal oxygen... ADDENDUM>>  ONO performed 06/15/15 w/ O2sat<88% for almost 5h of the total 7h study (there were 69 events or 9/hr;  She needs O2 at 2L/min Qhs and MUST QUIT ALL SMOKING!  ~  July 04, 2015:  2-84moROV w/ SN>  KTeleshareports that she is improved on the SDarden Restaurants& hasn't needed her rescue inhaler in some time; she was able to quit smoking transiently w/ the help of Chantix but unfortunately she has restarted the smoking- now back to 1ppd; she understands the importance of smoking cessation in her overall health picture & recovery; we wrote for the Chantix maintenance packs...     Mod  COPD/emphysema w/ small reversible component> on Stiolto-2sp per day + AlbutHFA rescue inhaler; states she is improved on the Stiolto & hasn't needed the rescue inhaler recently; rec to continue the same...    Nocturnal hypoxemia> on O2 at 2L/min Qhs; several sleep tests w/o signif OSA...    Current cigarette smoker> she reports that Chantix helped her quit, was using e-cig, then went on a cruise & started cigarettes again- now back to 1ppd and "life is good" she says; we refilled the Chantix Rx for her...    Hx cancer at base of tongue> s/p XRT, chemoRx in [place] (she was HPV pos); rec to try Biotene for dry mouth    Other medical issues>> Hx GERD, Hx DJD, Hx Bipolar, Hx skin cancers... EXAM reveals Afeb, VSS, O2sat=92% on RA at rest;  HEENT- denture, no lesions seen, mallampati2;  Chest- decr BS bilat w/o w/r/r;  Heart- RR w/o m/r/g;  Ext- neg w/o c/c/e...  We reviewed prob list, meds, xrays and labs>> Given the 2016 Flu vaccine today; home O2 supplied by [facility]... IMP/PLAN>>  KDarleenemust quit all smoking & stay quit; rec to continue the Stiolto, use Albut rescue inhaler as needed; use the nocturnal O2 at 2L/min Qhs; get on diet & increase exercise program... She will f/u [place] if needed.  ~  January 01, 2016:  81m71moV & Kaitlinn is still smoking ~3cig/d AND taking Chantix;  She notes that her breathing is "great" and she continues on Stiolto- 2sp/d and AlbutHFA rescue inhaler but she hasn't needed this in months;  She also uses Home O2 Qhs due to nocturnal hypoxemia on ONO test- [facility];  She reports that [place] (no records avail to review- she says a nonfunctioning benign nodule) and she had a bout of Shingles in her left hip area 10/30/15 treated by her PCP & resolved... She reports that sister Dx w/ ovarian cancer- pt had genetic testing "I don't have any mutant genes" she says (Oncology note from  12/12/15 is reviewed)...  EXAM reveals Afeb, VSS, O2sat=94% on RA at rest;  HEENT- denture, no lesions seen, mallampati2;  Chest- decr BS bilat w/o w/r/r;  Heart- RR w/o m/r/g;  Abd- soft nontender neg;  Ext- neg w/o c/c/e...  IMP/PLAN>>  [facility];  We will refill her Stiolto;  Most important intervention is to [place]  We plan ROV in 81mo23mooner if needed prn...    Past Medical History  Diagnosis Date    COPD/emphsema, cig smoker >>      Hx OSA, nocturnal hypoxemia >>    .  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 Thyroid nodule >> states she had eval & f/u in [place], Bx=neg   . Hx of chronic sinusitis   . GERD (gastroesophageal reflux disease) >> on Omeprazole20   . Bipolar 1 disorder >> she reports off all meds x PROZAC20   . 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 Hiatal hernia   . History of HPV infection   . Glaucoma   . Sciatica   . Sensory hearing loss, bilateral   . Sjogren's syndrome 09-02-2007  . 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 Skin cancer w/ Moh's surg on right side of nose     Past Surgical History  Procedure Laterality Date  . Turbinate surgery      Dr. Nicole Cella in [place].  . Cataract extraction Bilateral 05-29-10, 06-14-10  . Knee surgery  08-15-11    arthoscopy  . Tongue cancer    . Appendectomy    . Tubal ligation  1981  . Abdominal hysterectomy      Outpatient Encounter Prescriptions as of 01/01/2016  Medication Sig  . Albuterol Sulfate (PROAIR RESPICLICK) 431 (90 BASE) MCG/ACT AEPB Inhale 2 puffs into the lungs every 6 (six) hours as needed.  Marland Kitchen b complex vitamins tablet Bid po OTC  . cetirizine (ZYRTEC) 10 MG tablet Take 10 mg by mouth daily as needed for allergies.  Marland Kitchen FLUoxetine (PROZAC) 20 MG capsule Take 20 mg by mouth.  .  fluticasone (FLONASE) 50 MCG/ACT nasal spray Place 2 sprays into both nostrils daily.  Marland Kitchen gabapentin (NEURONTIN) 400 MG capsule Take 400 mg by mouth daily.  Marland Kitchen GLUCOSA-CHONDR-NA CHONDR-MSM PO Take by mouth daily. W/ vit d  . omeprazole (PRILOSEC) 20 MG capsule Take 20 mg by mouth daily.   . Probiotic Product (PROBIOTIC & ACIDOPHILUS EX ST PO) Take 1 tablet by mouth daily.  . Tiotropium Bromide-Olodaterol (STIOLTO RESPIMAT) 2.5-2.5 MCG/ACT AERS Inhale 2 puffs into the lungs daily.  . varenicline (CHANTIX CONTINUING MONTH PAK) 1 MG tablet Take 1 tablet (1 mg total) by mouth 2 (two) times daily.  . [DISCONTINUED] Tiotropium Bromide-Olodaterol (STIOLTO RESPIMAT) 2.5-2.5 MCG/ACT AERS Inhale 2 puffs into the lungs daily.  . [DISCONTINUED] Tiotropium Bromide-Olodaterol (STIOLTO RESPIMAT) 2.5-2.5 MCG/ACT AERS Inhale 2 puffs into the lungs daily.  . [DISCONTINUED] CHANTIX STARTING MONTH PAK 0.5 MG X 11 & 1 MG X 42 tablet See admin instructions. Reported on 01/01/2016   No facility-administered encounter medications on file as of 01/01/2016.    Allergies  Allergen Reactions  . Aspirin Swelling  . Compazine [Prochlorperazine Edisylate]     Slurred speech   . Pseudophed-Chlophedianol-Gg     Side effects  . Seroquel [Quetiapine Fumarate] Swelling  . Latex Rash  . Tape Rash    adhesive    Current Medications, Allergies, Past Medical History, Past Surgical History, Family History, and Social History were reviewed in [employer] record.   Review of Systems            All symptoms NEG except where BOLDED >>  Constitutional:  F/C/S, fatigue, anorexia, unexpected weight change. HEENT:  HA, visual changes, hearing loss, earache, nasal symptoms, sore throat, mouth sores, hoarseness. Resp:  cough, sputum, hemoptysis; SOB, tightness, wheezing. Cardio:  CP, palpit, DOE, orthopnea, edema. GI:  N/V/D/C,  blood in stool; reflux, abd pain, distention, gas. GU:  dysuria, freq, urgency,  hematuria, flank pain, voiding difficulty. MS:  joint pain, swelling, tenderness, decr ROM; neck pain, back pain, etc. Neuro:  HA, tremors, seizures, dizziness, syncope, weakness, numbness, gait abn. Skin:  suspicious lesions or skin rash. Heme:  adenopathy, bruising, bleeding. Psyche:  confusion, agitation, sleep disturbance, hallucinations, anxiety, depression suicidal.   Objective:   Physical Exam      Vital Signs:  Reviewed...   General:  WD, WN, 70 y/o WF in NAD; alert & oriented; pleasant & cooperative... HEENT:  [place]/AT; Conjunctiva- pink, Sclera- nonicteric, EOM-wnl, PERRLA, EACs-clear, TMs-wnl; NOSE-clear; THROAT- dentures, neg w/o lesions seen.  Neck:  Supple w/ fair ROM; no JVD; normal carotid impulses w/o bruits; no thyromegaly or nodules palpated; no lymphadenopathy.  Chest:  decr BS bilat without wheezes, rales, or rhonchi heard. Heart:  Regular Rhythm; norm S1 & S2 without murmurs, rubs, or gallops detected. Abdomen:  Soft & nontender- no guarding or rebound; normal bowel sounds; no organomegaly or masses palpated. Ext:  Normal ROM; without deformities or arthritic changes; no varicose veins, venous insuffic, or edema;  Pulses intact w/o bruits. Neuro:  No focal neuro deficit; gait normal & balance OK. Derm:  No lesions noted; no rash etc. Lymph:  No cervical, supraclavicular, axillary, or inguinal adenopathy palpated.   Assessment:      IMP >>  1)  Mod COPD/emphysema w/ small reversible component-- on Stiolto-2sp per day + AlbutHFA rescue inhaler... 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 Other medical issues>>  Hx GERD, Hx DJD, Hx Bipolar, Hx skin cancers   PLAN >>  04/18/15>  Samariya has mod COPD/emphysema & a small reversible component; she needs to quit all smoking & use the Stiolto regularly, plus her prn rescue inhaler;  She has been found  to have nocturnal hypoxemia but no signif OSA at this time- we will proceed w/ her ONO to see if she needs nocturnal oxygen... 07/04/15>  Kiyla must quit all smoking & stay quit; rec to continue the Stiolto, use Albut rescue inhaler as needed; use the nocturnal O2 at 2L/min Qhs; get on diet & increase exercise program... She will f/u in 55mo sooner if needed. 01/01/16>  [facility];  We will refill her Stiolto;  Most important intervention is to [place]!!  We plan ROV in 635mosooner if needed prn     Plan:     Patient's Medications  New Prescriptions   No medications on file  Previous Medications   ALBUTEROL SULFATE (PROAIR RESPICLICK) 1029790 BASE) MCG/ACT AEPB    Inhale 2 puffs into the lungs every 6 (six) hours as needed.   B COMPLEX VITAMINS TABLET    Bid po OTC   CETIRIZINE (ZYRTEC) 10 MG TABLET    Take 10 mg by mouth daily as needed for allergies.   FLUOXETINE (PROZAC) 20 MG CAPSULE    Take 20 mg by mouth.   FLUTICASONE (FLONASE) 50 MCG/ACT NASAL SPRAY    Place 2 sprays into both nostrils daily.   GABAPENTIN (NEURONTIN) 400 MG CAPSULE    Take 400 mg by mouth daily.   GLUCOSA-CHONDR-NA CHONDR-MSM PO    Take by mouth daily. W/ vit d   OMEPRAZOLE (PRILOSEC) 20 MG CAPSULE    Take  20 mg by mouth daily.    PROBIOTIC PRODUCT (PROBIOTIC & ACIDOPHILUS EX ST PO)    Take 1 tablet by mouth daily.   VARENICLINE (CHANTIX CONTINUING MONTH PAK) 1 MG TABLET    Take 1 tablet (1 mg total) by mouth 2 (two) times daily.  Modified Medications   Modified Medication Previous Medication   TIOTROPIUM BROMIDE-OLODATEROL (STIOLTO RESPIMAT) 2.5-2.5 MCG/ACT AERS Tiotropium Bromide-Olodaterol (STIOLTO RESPIMAT) 2.5-2.5 MCG/ACT AERS      Inhale 2 puffs into the lungs daily.    Inhale 2 puffs into the lungs daily.  Discontinued Medications   CHANTIX STARTING MONTH PAK 0.5 MG X 11 & 1 MG X 42 TABLET    See admin instructions. Reported on 01/01/2016   TIOTROPIUM  BROMIDE-OLODATEROL (STIOLTO RESPIMAT) 2.5-2.5 MCG/ACT AERS    Inhale 2 puffs into the lungs daily.

## 2016-01-03 ENCOUNTER — Telehealth: Payer: Self-pay | Admitting: Pulmonary Disease

## 2016-01-03 DIAGNOSIS — J432 Centrilobular emphysema: Secondary | ICD-10-CM

## 2016-01-03 NOTE — Telephone Encounter (Signed)
[name] from [facility] states that in order for patient to be switched from [facility], she will need a "FULL O2 ORDER", meaning she will need O2 S[place] and since patient is on Medicare they will have to run it through medicare to make sure they will accept the switch.    Looking through patient's chart, it looks as if she only uses O2 at night and had an ONO done on 06/15/15.  Called [name] back and left message asking her if this ONO will be sufficient or do we need to do another ONO to qualify patient to [place] O2 at night.  Awaiting call back from [facility].

## 2016-01-03 NOTE — Telephone Encounter (Signed)
Called and spoke to [place]. [name] states she is needing a full O2 order and will check into seeing if the previous ONO can be used for the DME transition. Order placed. [name] aware. Nothing further needed at this time.

## 2016-01-03 NOTE — Telephone Encounter (Signed)
[name] from [facility] returned Michelle's call.

## 2016-01-09 DIAGNOSIS — E890 Postprocedural hypothyroidism: Secondary | ICD-10-CM | POA: Diagnosis not present

## 2016-01-20 DIAGNOSIS — B029 Zoster without complications: Secondary | ICD-10-CM | POA: Diagnosis not present

## 2016-01-29 DIAGNOSIS — L718 Other rosacea: Secondary | ICD-10-CM | POA: Diagnosis not present

## 2016-01-29 DIAGNOSIS — B0089 Other herpesviral infection: Secondary | ICD-10-CM | POA: Diagnosis not present

## 2016-03-01 ENCOUNTER — Telehealth: Payer: Self-pay | Admitting: Pulmonary Disease

## 2016-03-01 MED ORDER — TIOTROPIUM BROMIDE-OLODATEROL 2.5-2.5 MCG/ACT IN AERS: 2.0000 | INHALATION_SPRAY | Freq: Every day | RESPIRATORY_TRACT | Status: DC

## 2016-03-01 NOTE — Telephone Encounter (Signed)
Spoke with pt and advised that rx was sent to [facility] as requested. Nothing further needed.

## 2016-04-02 DIAGNOSIS — E890 Postprocedural hypothyroidism: Secondary | ICD-10-CM | POA: Diagnosis not present

## 2016-04-08 ENCOUNTER — Other Ambulatory Visit: Payer: Self-pay | Admitting: Otolaryngology

## 2016-04-08 DIAGNOSIS — C01 Malignant neoplasm of base of tongue: Secondary | ICD-10-CM

## 2016-04-10 ENCOUNTER — Ambulatory Visit
Admission: RE | Admit: 2016-04-10 | Discharge: 2016-04-10 | Disposition: A | Discharge status: Home / Self Care | Payer: Medicare Other | Source: Ambulatory Visit | Attending: Otolaryngology | Admitting: Otolaryngology

## 2016-04-10 DIAGNOSIS — C01 Malignant neoplasm of base of tongue: Secondary | ICD-10-CM

## 2016-04-10 DIAGNOSIS — J449 Chronic obstructive pulmonary disease, unspecified: Secondary | ICD-10-CM | POA: Diagnosis not present

## 2016-04-10 MED ORDER — GADOBENATE DIMEGLUMINE 529 MG/ML IV SOLN
17.0000 mL | Freq: Once | INTRAVENOUS | Status: AC | PRN
  Administered 2016-04-10: 17 mL via INTRAVENOUS

## 2016-05-31 DIAGNOSIS — Z86010 Personal history of colonic polyps: Secondary | ICD-10-CM | POA: Diagnosis not present

## 2016-05-31 DIAGNOSIS — R197 Diarrhea, unspecified: Secondary | ICD-10-CM | POA: Diagnosis not present

## 2016-06-06 ENCOUNTER — Other Ambulatory Visit: Payer: Self-pay | Admitting: Gastroenterology

## 2016-06-06 DIAGNOSIS — Z86010 Personal history of colonic polyps: Secondary | ICD-10-CM | POA: Diagnosis not present

## 2016-06-06 DIAGNOSIS — D126 Benign neoplasm of colon, unspecified: Secondary | ICD-10-CM | POA: Diagnosis not present

## 2016-06-06 DIAGNOSIS — D123 Benign neoplasm of transverse colon: Secondary | ICD-10-CM | POA: Diagnosis not present

## 2016-06-06 DIAGNOSIS — K529 Noninfective gastroenteritis and colitis, unspecified: Secondary | ICD-10-CM | POA: Diagnosis not present

## 2016-06-06 DIAGNOSIS — R197 Diarrhea, unspecified: Secondary | ICD-10-CM | POA: Diagnosis not present

## 2016-07-03 ENCOUNTER — Ambulatory Visit (INDEPENDENT_AMBULATORY_CARE_PROVIDER_SITE_OTHER): Payer: Medicare Other | Admitting: Pulmonary Disease

## 2016-07-03 ENCOUNTER — Encounter: Payer: Self-pay | Admitting: Pulmonary Disease

## 2016-07-03 VITALS — BP 110/70 | HR 67 | Temp 97.4°F | Ht 68.5 in | Wt 178.4 lb

## 2016-07-03 DIAGNOSIS — Z23 Encounter for immunization: Secondary | ICD-10-CM | POA: Diagnosis not present

## 2016-07-03 DIAGNOSIS — J432 Centrilobular emphysema: Secondary | ICD-10-CM

## 2016-07-03 DIAGNOSIS — F17210 Nicotine dependence, cigarettes, uncomplicated: Secondary | ICD-10-CM

## 2016-07-03 DIAGNOSIS — C029 Malignant neoplasm of tongue, unspecified: Secondary | ICD-10-CM

## 2016-07-03 DIAGNOSIS — G4734 Idiopathic sleep related nonobstructive alveolar hypoventilation: Secondary | ICD-10-CM

## 2016-07-03 MED ORDER — TIOTROPIUM BROMIDE-OLODATEROL 2.5-2.5 MCG/ACT IN AERS: 2.0000 | INHALATION_SPRAY | Freq: Every day | RESPIRATORY_TRACT | 0 refills | Status: AC

## 2016-07-03 NOTE — Progress Notes (Signed)
Subjective:     Patient ID: Lori Day, female   DOB: Feb 11, 1946, 70 y.o.   MRN: [id number]  HPI ~  April 18, 2015:  Initial visit w/ SN>  Her PCP is Animal nutritionist at [facility], [place]...      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       She relates a hx going back to 2009 in [place]- she had a sleep study- ?results, but she was started on CPAP; she subseq lost weight & improved so she stopped the CPAP on her own;  She was later re-tested and was said to have improved but she doesn't know the details;  Then in 2013 she had another abn sleep test & her physician restarted her CPAP (this was before her tongue cancer Dx);  She again stopped the CPAP in 2013 w/ the tongue cancer dx & treatment regimen of XRT & Chemo;  Then in 2015 she had a neuro eval by DrDohmeier (pt c/o pain & numbness in extremities, hx of pinched nerve, hx CTS, MRI neck w/ foraminal narrowing C5-C6) which included a home sleep study reported neg for OSA but she had nocturnal hypoxemia down to 70% sat she says and pt was referred to DrClance...       She saw DrClance 02/2015 & he reviewed her 08/2014 home sleep test- AHI=3.9 events/hr, lowest O2sat=82% (2hrs below 88%); she also has a hx COPD prev followed by a pulmonologist in [place]; still smoking as noted above; hx DOE w/ stairs and inclines or if rushing on level ground; denied much cough or sput; CXR showed COPD, NAD, and PFTs showed  mod airflow obstruction w/ decr DLCO (59%) and a reversible component;  He tried her on Stiolto- 2sp daily which she has taken over the last month- "about the same" she says (but still smoking)...      Current Meds>  Stiolto Respimat- 2 sprays once daily; Albuterol-HFA rescue inhaler prn, Chantix starter pack per her PCP, Zyrtek10/ Flonase Qhs...       EXAM reveals Afeb, VSS, O2sat=95% on RA at rest;  HEENT- denture, no lesions seen, mallampati2;  Chest- decr BS bilat w/o w/r/r;  Heart- RR w/o m/r/g;  Ext- neg w/o c/c/e...   CXR 03/15/15 showed norm heart size, COPD/emphysema, min atx/scarring at bases, NAD...  PFTs 03/16/15 showed FVC=3.26 (89%), FEV1=1.98 (71%), %1sec=61, mid-flows reduced at 43% predicted; after bronchodil FEV1 improved 16%; Lung volumes wnl; DLCO reduced at 59%... This is c/u mod airflow obstruction, emphysema, and a surprising reversible component...  ONO before considering nocturnal oxygen (Medicare doesn't accept oximetry from sleep studies)> pending=> O2sat<88% of 5h of the 7h study. IMP/PLAN>>  [name] has mod COPD/emphysema & a small reversible component; she needs to quit all smoking & use the Stiolto regularly, plus her prn rescue inhaler;  She has been found to have nocturnal hypoxemia but no signif  OSA at this time- we will proceed w/ her ONO to see if she needs nocturnal oxygen... ADDENDUM>>  ONO performed 06/15/15 w/ O2sat<88% for almost 5h of the total 7h study (there were 69 events or 9/hr;  She needs O2 at 2L/min Qhs and MUST QUIT ALL SMOKING!  ~  July 04, 2015:  2-70moROV w/ SN>  KTykierareports that she is improved on the SDarden Restaurants& hasn't needed her rescue inhaler in some time; she was able to quit smoking transiently w/ the help of Chantix but unfortunately she has restarted the smoking- now back to 1ppd; she understands the importance of smoking cessation in her overall health picture & recovery; we wrote for the Chantix maintenance packs...     Mod  COPD/emphysema w/ small reversible component> on Stiolto-2sp per day + AlbutHFA rescue inhaler; states she is improved on the Stiolto & hasn't needed the rescue inhaler recently; rec to continue the same...    Nocturnal hypoxemia> on O2 at 2L/min Qhs; several sleep tests w/o signif OSA...    Current cigarette smoker> she reports that Chantix helped her quit, was using e-cig, then went on a cruise & started cigarettes again- now back to 1ppd and "life is good" she says; we refilled the Chantix Rx for her...    Hx cancer at base of tongue> s/p XRT, chemoRx in [place] (she was HPV pos); rec to try Biotene for dry mouth    Other medical issues>> Hx GERD, Hx DJD, Hx Bipolar, Hx skin cancers... EXAM reveals Afeb, VSS, O2sat=92% on RA at rest;  HEENT- denture, no lesions seen, mallampati2;  Chest- decr BS bilat w/o w/r/r;  Heart- RR w/o m/r/g;  Ext- neg w/o c/c/e...  We reviewed prob list, meds, xrays and labs>> Given the 2016 Flu vaccine today; home O2 supplied by [facility]... IMP/PLAN>>  KAluelmust quit all smoking & stay quit; rec to continue the Stiolto, use Albut rescue inhaler as needed; use the nocturnal O2 at 2L/min Qhs; get on diet & increase exercise program... She will f/u in [place] if needed.  ~  January 01, 2016:  64m564moV & [name] is still smoking ~3cig/d AND taking Chantix;  She notes that her breathing is "great" and she continues on Stiolto- 2sp/d and AlbutHFA rescue inhaler but she hasn't needed this in months;  She also uses Home O2 Qhs due to nocturnal hypoxemia on ONO test- she gets this from [facility];  She reports that [place] (no records avail to review- she says a nonfunctioning benign nodule) and she had a bout of Shingles in her left hip area 10/30/15 treated by her PCP & resolved... She reports that sister Dx w/ ovarian cancer- pt had genetic testing "I don't have any mutant genes" she says (Oncology note from  12/12/15 is reviewed)...  EXAM reveals Afeb, VSS, O2sat=94% on RA at rest;  HEENT- denture, no lesions seen, mallampati2;  Chest- decr BS bilat w/o w/r/r;  Heart- RR w/o m/r/g;  Abd- soft nontender neg;  Ext- neg w/o c/c/e...  IMP/PLAN>>  [facility];  We will refill her Stiolto;  Most important intervention is to QU[place]  We plan ROV in 64mo61mooner if needed prn...   ~  July 03, 2016:  PCP= CourMarda Stalker; [place]/ her GOLD Stage2 COPD/ emphysema- back to  smoking 1ppd, on O2 at 2L/min Qhs ("it helps me sleep"), Stiolto- 2sp/d, AlbutHFA rescue prn, and Flonase/ claritin prn;  She did pretty well w/ Chantix & will try this again...  She had colonoscopy several wks ago by DrOutlaw- polyp removed & f/u planned in 5 yrs... We reviewed the following medical problems during today's office visit >>     Mod COPD/emphysema w/ small reversible component> on Stiolto-2sp per day + AlbutHFA rescue inhaler; states she is improved on the Stiolto & hasn't needed the rescue inhaler recently; rec to continue the same...    Nocturnal hypoxemia> on O2 at 2L/min Qhs; several sleep tests in the past w/o signif OSA...    Current cigarette smoker> she reports that Chantix helped her quit, was using e-cig, then went on a cruise & started cigarettes again- now back to 1ppd and "life is good" she says; we refilled the Chantix Rx for her...    Hx cancer at base of tongue> s/p XRT, chemoRx in [place] (she was HPV pos); rec to try Biotene for dry mouth    S/p thyroid surg in [place] 07/2015> no records avail to review...    Other medical issues>> Hx GERD, Hx DJD, Hx Bipolar, Hx skin cancers... EXAM reveals Afeb, VSS, O2sat=95% on RA at rest;  Wt down 17# to 179# today; HEENT- denture, no lesions seen, mallampati2;  Chest- decr BS bilat w/o w/r/r;  Heart- RR w/o m/r/g;  Ext- neg w/o c/c/e...   MRI Neck 04/10/16>  Asymmetric soft tissue at the right  tongue base may reflect post radiation change; no discrete mass, no adenopathy, mils asymmetric submandib glands R>L...  CXR 04/10/16>  Norm heart size, mild hyperinflation, scarring in upper lobes & bases- NAD, no adenopathy, boney demineralization is apparent... IMP/PLAN>>  Daveda is her own worst enemy- she must quit all smoking 7 stay quit, Chantix refilled, stay on Stiolto & Albut rescue, 2017 Flu shot given today...    Past Medical History  Diagnosis Date    COPD/emphsema, cig smoker >>      Hx OSA, nocturnal hypoxemia >>    . 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 Thyroid nodule >> states she had eval & f/u in [place], Bx=neg   . Hx of chronic sinusitis   . GERD (gastroesophageal reflux disease) >> on Omeprazole20   . Bipolar 1 disorder >> she reports off all meds x PROZAC20   . 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 Hiatal hernia   . History of HPV infection   . Glaucoma   . Sciatica   . Sensory hearing loss, bilateral   . Sjogren's syndrome 09-02-2007  . 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 Skin cancer w/ Moh's surg on right side of nose     Past Surgical History:  Procedure Laterality Date  . ABDOMINAL HYSTERECTOMY    . APPENDECTOMY    . CATARACT EXTRACTION Bilateral 05-29-10, 06-14-10  . KNEE SURGERY  08-15-11   arthoscopy  . tongue cancer    . TUBAL LIGATION  1981  . turbinate surgery     Dr. Nicole Cella in [place].    Outpatient Encounter Prescriptions as of 07/03/2016  Medication Sig  . Albuterol Sulfate (PROAIR RESPICLICK) 440 (90 BASE) MCG/ACT AEPB Inhale 2 puffs into the lungs every  6 (six) hours as needed.  Marland Kitchen b complex vitamins tablet Bid po OTC  . FLUoxetine (PROZAC) 20 MG capsule Take 20 mg by mouth.  . fluticasone (FLONASE) 50 MCG/ACT nasal spray Place 2 sprays into both  nostrils daily.  Marland Kitchen GLUCOSA-CHONDR-NA CHONDR-MSM PO Take by mouth daily. W/ vit d  . levothyroxine (SYNTHROID, LEVOTHROID) 50 MCG tablet Take 50 mcg by mouth daily before breakfast.  . metroNIDAZOLE (METROCREAM) 0.75 % cream Apply 1 application topically 2 (two) times daily. For rosacea  . omeprazole (PRILOSEC) 20 MG capsule Take 20 mg by mouth daily.   . Tiotropium Bromide-Olodaterol (STIOLTO RESPIMAT) 2.5-2.5 MCG/ACT AERS Inhale 2 puffs into the lungs daily.  . valACYclovir (VALTREX) 1000 MG tablet Take 1,000 mg by mouth 2 (two) times daily.  . varenicline (CHANTIX CONTINUING MONTH PAK) 1 MG tablet Take 1 tablet (1 mg total) by mouth 2 (two) times daily.  . cetirizine (ZYRTEC) 10 MG tablet Take 10 mg by mouth daily as needed for allergies.  . Tiotropium Bromide-Olodaterol (STIOLTO RESPIMAT) 2.5-2.5 MCG/ACT AERS Inhale 2 puffs into the lungs daily.  . [DISCONTINUED] gabapentin (NEURONTIN) 400 MG capsule Take 400 mg by mouth daily.  . [DISCONTINUED] Probiotic Product (PROBIOTIC & ACIDOPHILUS EX ST PO) Take 1 tablet by mouth daily.   No facility-administered encounter medications on file as of 07/03/2016.     Allergies  Allergen Reactions  . Aspirin Swelling  . Compazine [Prochlorperazine Edisylate]     Slurred speech   . Pseudophed-Chlophedianol-Gg     Side effects  . Seroquel [Quetiapine Fumarate] Swelling  . Latex Rash  . Tape Rash    adhesive    Current Medications, Allergies, Past Medical History, Past Surgical History, Family History, and Social History were reviewed in [employer] record.   Review of Systems            All symptoms NEG except where BOLDED >>  Constitutional:  F/C/S, fatigue, anorexia, unexpected weight change. HEENT:  HA, visual changes, hearing loss, earache, nasal symptoms, sore throat, mouth sores, hoarseness. Resp:  cough, sputum, hemoptysis; SOB, tightness, wheezing. Cardio:  CP, palpit, DOE, orthopnea, edema. GI:  N/V/D/C, blood  in stool; reflux, abd pain, distention, gas. GU:  dysuria, freq, urgency, hematuria, flank pain, voiding difficulty. MS:  joint pain, swelling, tenderness, decr ROM; neck pain, back pain, etc. Neuro:  HA, tremors, seizures, dizziness, syncope, weakness, numbness, gait abn. Skin:  suspicious lesions or skin rash. Heme:  adenopathy, bruising, bleeding. Psyche:  confusion, agitation, sleep disturbance, hallucinations, anxiety, depression suicidal.   Objective:   Physical Exam      Vital Signs:  Reviewed...   General:  WD, WN, 70 y/o WF in NAD; alert & oriented; pleasant & cooperative... HEENT:  [place]/AT; Conjunctiva- pink, Sclera- nonicteric, EOM-wnl, PERRLA, EACs-clear, TMs-wnl; NOSE-clear; THROAT- dentures, neg w/o lesions seen.  Neck:  Supple w/ fair ROM; no JVD; normal carotid impulses w/o bruits; no thyromegaly or nodules palpated; no lymphadenopathy.  Chest:  decr BS bilat without wheezes, rales, or rhonchi heard. Heart:  Regular Rhythm; norm S1 & S2 without murmurs, rubs, or gallops detected. Abdomen:  Soft & nontender- no guarding or rebound; normal bowel sounds; no organomegaly or masses palpated. Ext:  Normal ROM; without deformities or arthritic changes; no varicose veins, venous insuffic, or edema;  Pulses intact w/o bruits. Neuro:  No focal neuro deficit; gait normal & balance OK. Derm:  No lesions noted; no rash etc. Lymph:  No cervical, supraclavicular, axillary, or inguinal adenopathy  palpated.   Assessment:      IMP >>  1)  Mod COPD/emphysema w/ small reversible component-- on Stiolto-2sp per day + AlbutHFA rescue inhaler... 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 Other medical issues>>  Hx GERD, Hx DJD, Hx Bipolar, Hx skin cancers   PLAN >>  04/18/15>  Tyshae has mod COPD/emphysema & a small reversible component; she needs to quit all smoking &  use the Stiolto regularly, plus her prn rescue inhaler;  She has been found to have nocturnal hypoxemia but no signif OSA at this time- we will proceed w/ her ONO to see if she needs nocturnal oxygen... 07/04/15>  Blaize must quit all smoking & stay quit; rec to continue the Stiolto, use Albut rescue inhaler as needed; use the nocturnal O2 at 2L/min Qhs; get on diet & increase exercise program... She will f/u in 29mo sooner if needed. 01/01/16>  [facility];  We will refill her Stiolto;  Most important intervention is to Q[place]!!  We plan ROV in 636mosooner if needed prn     Plan:     Patient's Medications  New Prescriptions   TIOTROPIUM BROMIDE-OLODATEROL (STIOLTO RESPIMAT) 2.5-2.5 MCG/ACT AERS    Inhale 2 puffs into the lungs daily.  Previous Medications   ALBUTEROL SULFATE (PROAIR RESPICLICK) 1050290 BASE) MCG/ACT AEPB    Inhale 2 puffs into the lungs every 6 (six) hours as needed.   B COMPLEX VITAMINS TABLET    Bid po OTC   CETIRIZINE (ZYRTEC) 10 MG TABLET    Take 10 mg by mouth daily as needed for allergies.   FLUOXETINE (PROZAC) 20 MG CAPSULE    Take 20 mg by mouth.   FLUTICASONE (FLONASE) 50 MCG/ACT NASAL SPRAY    Place 2 sprays into both nostrils daily.   GLUCOSA-CHONDR-NA CHONDR-MSM PO    Take by mouth daily. W/ vit d   LEVOTHYROXINE (SYNTHROID, LEVOTHROID) 50 MCG TABLET    Take 50 mcg by mouth daily before breakfast.   METRONIDAZOLE (METROCREAM) 0.75 % CREAM    Apply 1 application topically 2 (two) times daily. For rosacea   OMEPRAZOLE (PRILOSEC) 20 MG CAPSULE    Take 20 mg by mouth daily.    TIOTROPIUM BROMIDE-OLODATEROL (STIOLTO RESPIMAT) 2.5-2.5 MCG/ACT AERS    Inhale 2 puffs into the lungs daily.   VALACYCLOVIR (VALTREX) 1000 MG TABLET    Take 1,000 mg by mouth 2 (two) times daily.   VARENICLINE (CHANTIX CONTINUING MONTH PAK) 1 MG TABLET    Take 1 tablet (1 mg total) by mouth 2 (two) times daily.  Modified Medications   No medications on  file  Discontinued Medications   GABAPENTIN (NEURONTIN) 400 MG CAPSULE    Take 400 mg by mouth daily.   PROBIOTIC PRODUCT (PROBIOTIC & ACIDOPHILUS EX ST PO)    Take 1 tablet by mouth daily.

## 2016-07-03 NOTE — Patient Instructions (Signed)
Today we updated your med list in our EPIC system...    Continue your current medications the same...  We gave you the 2017 flu vaccine today...  Nunzio Cory, YOU NEED TO QUIT ALL SMOKING (or I'll tell your grand-daughter)...  Call for any questions...  Let's plan a follow up visit in 33mo sooner if needed for problems..Marland KitchenMarland Kitchen

## 2016-07-11 ENCOUNTER — Other Ambulatory Visit: Payer: Self-pay | Admitting: Pulmonary Disease

## 2016-07-25 DIAGNOSIS — K219 Gastro-esophageal reflux disease without esophagitis: Secondary | ICD-10-CM | POA: Diagnosis not present

## 2016-07-25 DIAGNOSIS — R197 Diarrhea, unspecified: Secondary | ICD-10-CM | POA: Diagnosis not present

## 2016-07-25 DIAGNOSIS — Z86010 Personal history of colonic polyps: Secondary | ICD-10-CM | POA: Diagnosis not present

## 2016-08-21 DIAGNOSIS — M25531 Pain in right wrist: Secondary | ICD-10-CM | POA: Diagnosis not present

## 2016-08-21 DIAGNOSIS — C029 Malignant neoplasm of tongue, unspecified: Secondary | ICD-10-CM | POA: Diagnosis not present

## 2016-09-10 DIAGNOSIS — Z923 Personal history of irradiation: Secondary | ICD-10-CM | POA: Diagnosis not present

## 2016-09-10 DIAGNOSIS — Z85819 Personal history of malignant neoplasm of unspecified site of lip, oral cavity, and pharynx: Secondary | ICD-10-CM | POA: Diagnosis not present

## 2016-09-10 DIAGNOSIS — F17200 Nicotine dependence, unspecified, uncomplicated: Secondary | ICD-10-CM | POA: Diagnosis not present

## 2016-09-10 DIAGNOSIS — H9113 Presbycusis, bilateral: Secondary | ICD-10-CM | POA: Diagnosis not present

## 2016-09-12 ENCOUNTER — Telehealth: Payer: Self-pay | Admitting: *Deleted

## 2016-09-12 NOTE — Telephone Encounter (Signed)
LVMM on patient's home and mobile phones requesting return call for information re her interest in establishing post-tmt follow-up.  Gayleen Orem, RN, BSN, [place] at [place] [phone]

## 2016-09-12 NOTE — Progress Notes (Deleted)
error 

## 2016-09-12 NOTE — Telephone Encounter (Signed)
Oncology Nurse Navigator Documentation  [name] returned my call.  She:  Confirmed interest in establishing with radiation oncology for ongoing follow-up s/p chemo/XRT in [place] 3 yrs ago.    Recently moved to [place] from [place].    Confirmed understanding of appt to see Dr. Isidore Moos 11/21 at 11:00 with NE at 10:30.   Indicated she has discs with imaging and paper documentation re previous treatments.  She agreed to bring itmes to [facility] tomorrow for processing prior to her appt with Dr. Isidore Moos. I confirmed her understanding of [place] location, explained registration and arrival procedures. She understands I can be contacted with questions prior to her appt.  Gayleen Orem, RN, BSN, [place] at [place] [phone]

## 2016-09-13 ENCOUNTER — Ambulatory Visit
Admission: RE | Admit: 2016-09-13 | Discharge: 2016-09-13 | Disposition: A | Discharge status: Home / Self Care | Payer: Medicare Other | Source: Ambulatory Visit | Attending: Radiation Oncology | Admitting: Radiation Oncology

## 2016-09-13 ENCOUNTER — Other Ambulatory Visit: Payer: Self-pay | Admitting: Radiation Oncology

## 2016-09-13 ENCOUNTER — Encounter: Payer: Self-pay | Admitting: *Deleted

## 2016-09-13 DIAGNOSIS — C801 Malignant (primary) neoplasm, unspecified: Secondary | ICD-10-CM

## 2016-09-13 NOTE — Progress Notes (Signed)
Oncology Nurse Navigator Documentation  Received imaging discs and paper documentation left at lobby front desk by Ms. Charlestine Massed. Delivered discs to [facility] for transfer into PACS, delivered paper documentation to [facility], RadOnc, for scanning.  Gayleen Orem, RN, BSN, [place] at [place] [phone]

## 2016-09-15 NOTE — Progress Notes (Signed)
Lori Day is here for consult/follow up of radiation to her Left Base of Tongue, [id number] completed on 05/13/2013 by Dr. Garth Bigness at the [place] [facility] [place]. She also received Chemotherapy.    Pain issues, if any: She reports tendonitis and arthritis which is chronic.  Using a feeding tube?: No Weight changes, if any: She reports gaining her weight back after her radiation/chemotherapy. She has lost about 20 lbs over the year intentionally.  Wt Readings from Last 3 Encounters:  09/17/16 181 lb (82.1 kg)  07/03/16 178 lb 6 oz (80.9 kg)  01/01/16 196 lb (88.9 kg)   Swallowing issues, if any: She denies. She eats softer items because of the loss of her teeth on the bottom. She has dentures to her top teeth.  Smoking or chewing tobacco? She smokes 2 packs weekly. She reports she is attempting to quit completely.  Using fluoride trays daily? No Last ENT visit was on: Dr. Marilu Favre 09/06/15 ([place]). Dr. Izora Gala 09/13/16.  Other notable issues, if any: Thyroid Lobectomy 08/28/15, pathology revealing right medial canthal lesion excision; seborrheic keratosis. Right thyroid lobectomy multinodular goiter with adenomatous nodules and FNA changes   She reports her last PET was 11/16. She was told by Dr. Karren Cobble she was to have a PET yearly in 10/16. She is here for follow up.  She has multiple questions about her HPV + status. She reports multiple cold sores, and warts. She also has chronic boils to her perineum area.   MRI neck/ soft tissue 04/10/16 IMPRESSION: 1. Asymmetric soft tissue at the right base of tongue may reflect post radiation change. No discrete mass lesion is present. 2. No significant cervical adenopathy. 3. Mild asymmetry of the submandibular glands. The right gland is larger than the left. This may reflect post treatment change.  BP (!) 146/80   Pulse 68   Temp 98 F (36.7 C)   Ht 5' 8.5" (1.74 m)   Wt 181 lb (82.1 kg)   SpO2 98%  Comment: room air  BMI 27.12 kg/m

## 2016-09-17 ENCOUNTER — Ambulatory Visit
Admission: RE | Admit: 2016-09-17 | Discharge: 2016-09-17 | Disposition: A | Discharge status: Home / Self Care | Payer: Medicare Other | Source: Ambulatory Visit | Attending: Radiation Oncology | Admitting: Radiation Oncology

## 2016-09-17 ENCOUNTER — Encounter: Payer: Self-pay | Admitting: Radiation Oncology

## 2016-09-17 ENCOUNTER — Encounter: Payer: Self-pay | Admitting: *Deleted

## 2016-09-17 VITALS — BP 146/80 | HR 68 | Temp 98.0°F | Ht 68.5 in | Wt 181.0 lb

## 2016-09-17 DIAGNOSIS — Z825 Family history of asthma and other chronic lower respiratory diseases: Secondary | ICD-10-CM | POA: Insufficient documentation

## 2016-09-17 DIAGNOSIS — K219 Gastro-esophageal reflux disease without esophagitis: Secondary | ICD-10-CM | POA: Diagnosis not present

## 2016-09-17 DIAGNOSIS — R634 Abnormal weight loss: Secondary | ICD-10-CM | POA: Insufficient documentation

## 2016-09-17 DIAGNOSIS — B079 Viral wart, unspecified: Secondary | ICD-10-CM

## 2016-09-17 DIAGNOSIS — Z800 Family history of malignant neoplasm of digestive organs: Secondary | ICD-10-CM | POA: Insufficient documentation

## 2016-09-17 DIAGNOSIS — Z9221 Personal history of antineoplastic chemotherapy: Secondary | ICD-10-CM | POA: Insufficient documentation

## 2016-09-17 DIAGNOSIS — Z808 Family history of malignant neoplasm of other organs or systems: Secondary | ICD-10-CM | POA: Diagnosis not present

## 2016-09-17 DIAGNOSIS — C01 Malignant neoplasm of base of tongue: Secondary | ICD-10-CM

## 2016-09-17 DIAGNOSIS — Z716 Tobacco abuse counseling: Secondary | ICD-10-CM | POA: Diagnosis not present

## 2016-09-17 DIAGNOSIS — Z79899 Other long term (current) drug therapy: Secondary | ICD-10-CM | POA: Insufficient documentation

## 2016-09-17 DIAGNOSIS — H905 Unspecified sensorineural hearing loss: Secondary | ICD-10-CM | POA: Insufficient documentation

## 2016-09-17 DIAGNOSIS — F319 Bipolar disorder, unspecified: Secondary | ICD-10-CM | POA: Insufficient documentation

## 2016-09-17 DIAGNOSIS — M3500 Sicca syndrome, unspecified: Secondary | ICD-10-CM | POA: Diagnosis not present

## 2016-09-17 DIAGNOSIS — L989 Disorder of the skin and subcutaneous tissue, unspecified: Secondary | ICD-10-CM | POA: Diagnosis not present

## 2016-09-17 DIAGNOSIS — Z6827 Body mass index (BMI) 27.0-27.9, adult: Secondary | ICD-10-CM | POA: Diagnosis not present

## 2016-09-17 DIAGNOSIS — F17210 Nicotine dependence, cigarettes, uncomplicated: Secondary | ICD-10-CM

## 2016-09-17 DIAGNOSIS — Z8041 Family history of malignant neoplasm of ovary: Secondary | ICD-10-CM | POA: Insufficient documentation

## 2016-09-17 DIAGNOSIS — M47892 Other spondylosis, cervical region: Secondary | ICD-10-CM | POA: Insufficient documentation

## 2016-09-17 DIAGNOSIS — Z923 Personal history of irradiation: Secondary | ICD-10-CM | POA: Diagnosis not present

## 2016-09-17 DIAGNOSIS — Z91040 Latex allergy status: Secondary | ICD-10-CM | POA: Diagnosis not present

## 2016-09-17 DIAGNOSIS — H409 Unspecified glaucoma: Secondary | ICD-10-CM | POA: Insufficient documentation

## 2016-09-17 DIAGNOSIS — M1990 Unspecified osteoarthritis, unspecified site: Secondary | ICD-10-CM | POA: Diagnosis not present

## 2016-09-17 DIAGNOSIS — Z886 Allergy status to analgesic agent status: Secondary | ICD-10-CM | POA: Diagnosis not present

## 2016-09-17 DIAGNOSIS — Z85810 Personal history of malignant neoplasm of tongue: Secondary | ICD-10-CM | POA: Diagnosis not present

## 2016-09-17 DIAGNOSIS — B001 Herpesviral vesicular dermatitis: Secondary | ICD-10-CM

## 2016-09-17 DIAGNOSIS — E890 Postprocedural hypothyroidism: Secondary | ICD-10-CM

## 2016-09-17 DIAGNOSIS — Z08 Encounter for follow-up examination after completed treatment for malignant neoplasm: Secondary | ICD-10-CM | POA: Diagnosis not present

## 2016-09-17 DIAGNOSIS — Z888 Allergy status to other drugs, medicaments and biological substances status: Secondary | ICD-10-CM | POA: Insufficient documentation

## 2016-09-17 DIAGNOSIS — R87810 Cervical high risk human papillomavirus (HPV) DNA test positive: Secondary | ICD-10-CM | POA: Diagnosis not present

## 2016-09-17 LAB — TSH: TSH: 5.204 m(IU)/L — ABNORMAL HIGH (ref 0.308–3.960)

## 2016-09-17 MED ORDER — LEVOTHYROXINE SODIUM 75 MCG PO TABS: 75.0000 ug | ORAL_TABLET | Freq: Every day | ORAL | 6 refills | Status: AC

## 2016-09-17 NOTE — Progress Notes (Signed)
Oncology Nurse Navigator Documentation  Met with [name] during new patient consult with Dr. Isidore Moos.  She arrives to establish post-treatment follow-up. 1. Further introduced myself as the H&N Navigator, explained my role as a member of the Care Team.   2. Provided New Patient Information packet, discussed contents:  Contact information for physician(s), myself, other members of the Care Team.  Advance Directive information ([place] blue pamphlet with LCSW contact info).  She will bring copies  [place] with [facility] [place] 3.  Provided information/discussed opportunities for smoking cessation support:    QuitSmart class/individual counseling at [facility] [place] 4.  I returned reports and imaging discs. She understands she can contact me with future needs/concerns.  Gayleen Orem, RN, BSN, [place] at [place] [phone]

## 2016-09-17 NOTE — Progress Notes (Signed)
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 Garth Bigness at the [facility] [place]. She also received Chemotherapy.     ICD-9-CM ICD-10-CM   1. Smoking greater than 40 pack years 305.1 F17.210 CT CHEST LUNG CA SCREEN LOW DOSE W/O CM  2. Loss of weight 783.21 R63.4 TSH  3. Malignant neoplasm of base of tongue (HCC) 141.0 C01 Amb Referral to Survivorship Long term    CHIEF COMPLAINT: Here to discuss surveillance of base of tongue cancer  HISTORY OF PRESENT ILLNESS::Lori Day is a 70 y.o. female  Histology per Ms. Insco is here for consult/follow up of radiation to her Left Base of Tongue, T4aN2b completed on 05/13/2013 by Dr. Garth Bigness at the [facility] [place]. She also received Chemotherapy.  She since moved to [facility]  PET scan in Oct 2016 was NED last year.  MRI neck/ soft tissue 04/10/16 also NED    Pain issues, if any: She reports tendonitis and arthritis which is chronic. No throat pain. No neck masses Using a feeding tube?: No Weight changes, if any: She reports gaining her weight back after her radiation/chemotherapy. She has lost about 20 lbs over the year intentionally.      Wt Readings from Last 3 Encounters:  09/17/16 181 lb (82.1 kg)  07/03/16 178 lb 6 oz (80.9 kg)  01/01/16 196 lb (88.9 kg)    Swallowing issues, if any: She denies. She eats softer items because of the loss of her teeth on the bottom.   Dental: She has dentures to her top teeth. edentulous  Smoking or chewing tobacco? She smokes 2 packs weekly. She reports she is attempting to quit completely.   ETOH: 3 beers weekly  Last ENT visit was on: Dr. Marilu Favre  09/06/15 ([place]). Dr. Izora Gala 09/13/16.   Other notable issues, if any: Thyroid Lobectomy 08/28/15, pathology revealing right medial canthal lesion excision; seborrheic keratosis. Right thyroid lobectomy multinodular goiter with adenomatous nodules and FNA changes    She has multiple questions about her HPV + status. She reports multiple cold sores, and warts. Scalp lesion. She also has chronic boils to her perineum area.    Overall feels excellent.  A little SOB (chronic) noted.  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    PAST SURGICAL HISTORY: Past Surgical History:  Procedure Laterality Date  . ABDOMINAL HYSTERECTOMY    . APPENDECTOMY  1959  . BREAST BIOPSY     three times, twice on Left, once on Right, diagnosis as calcifications 1994-1999  . CATARACT EXTRACTION Bilateral 05-29-10, 06-14-10  . CHOLECYSTECTOMY    . KNEE SURGERY  08-15-11   arthoscopy  . tongue cancer    . TONSILECTOMY, ADENOIDECTOMY, BILATERAL MYRINGOTOMY AND TUBES  1955  . TUBAL LIGATION  1981  . turbinate surgery  Dr. Nicole Cella in [place].    FAMILY HISTORY: family history includes Breast cancer in her cousin; Breast cancer (age of onset: 69) in her paternal grandmother; COPD in her father and paternal grandfather; Emphysema in her paternal grandfather; Heart attack (age of onset: 63) in her maternal grandfather; Ovarian cancer (age of onset: 57) in her sister; Pancreatic cancer in her maternal grandmother;  Pancreatic cancer (age of onset: 74) in her mother; Prostate cancer in her maternal uncle and maternal uncle; Thyroid cancer (age of onset: 30) in her father.  SOCIAL HISTORY:  reports that she has been smoking Cigarettes.  She has a 50.00 pack-year smoking history. She has never used smokeless tobacco. She reports that she drinks alcohol. She reports that she does not use drugs.  ALLERGIES: Aspirin; Compazine [prochlorperazine edisylate]; Pseudophed-chlophedianol-gg; Seroquel [quetiapine fumarate]; Latex; and Tape  MEDICATIONS:  Current Outpatient Prescriptions  Medication Sig Dispense Refill  . Albuterol Sulfate (PROAIR RESPICLICK) 017 (90 BASE) MCG/ACT AEPB Inhale 2 puffs into the lungs every 6 (six) hours as needed. 1 each 0  . b complex vitamins tablet Bid po OTC    . cetirizine (ZYRTEC) 10 MG tablet Take 10 mg by mouth daily as needed for allergies.    . CHANTIX CONTINUING MONTH PAK 1 MG tablet TAKE 1 TABLET (1 MG TOTAL) BY MOUTH 2 (TWO) TIMES DAILY. 30 tablet 1  . FLUoxetine (PROZAC) 20 MG capsule Take 20 mg by mouth.    . fluticasone (FLONASE) 50 MCG/ACT nasal spray Place 2 sprays into both nostrils daily.    Marland Kitchen GLUCOSA-CHONDR-NA CHONDR-MSM PO Take by mouth daily. W/ vit d    . levothyroxine (SYNTHROID, LEVOTHROID) 50 MCG tablet Take 50 mcg by mouth daily before breakfast.    . metroNIDAZOLE (METROCREAM) 0.75 % cream Apply 1 application topically 2 (two) times daily. For rosacea    . omeprazole (PRILOSEC) 20 MG capsule Take 20 mg by mouth daily.     . Tiotropium Bromide-Olodaterol (STIOLTO RESPIMAT) 2.5-2.5 MCG/ACT AERS Inhale 2 puffs into the lungs daily. 3 Inhaler 4  . valACYclovir (VALTREX) 1000 MG tablet Take 1,000 mg by mouth 2 (two) times daily.     No current facility-administered medications for this encounter.     REVIEW OF SYSTEMS:  At least a 10 POINT REVIEW OF SYSTEMS WAS OBTAINED (HEENT, constitutional, neurologic, psychiatric, skin, cardiac, respiratory, GI, GU, MSK,  lymphatic).  All pertinent positives are noted in the HPI.  All others are negative.    PHYSICAL EXAM:  height is 5' 8.5" (1.74 m) and weight is 181 lb (82.1 kg). Her temperature is 98 F (36.7 C). Her blood pressure is 146/80 (abnormal) and her pulse is 68. Her oxygen saturation is 98%.   General: Alert and oriented, in no acute distress HEENT: Head is normocephalic. Extraocular movements are intact. Oropharynx is notable for no lesions. Neck: Neck is notable for no masses Heart: Regular in rate and rhythm with no murmurs, rubs, or gallops. Chest: Clear to auscultation bilaterally, with no rhonchi, wheezes, or rales. Abdomen: Soft, nontender, nondistended, with no rigidity or guarding. Extremities: No cyanosis or edema. Lymphatics: see Neck Exam Skin: small wart on hard, raised small crusted lesion on scalp, cold on sore lower lip. Musculoskeletal: symmetric strength and muscle tone throughout. Neurologic: Cranial nerves II through XII are grossly intact. No obvious focalities. Speech is fluent. Coordination is intact. Psychiatric: Judgment and insight are intact. Affect is appropriate.   ECOG = 0  0 - Asymptomatic (Fully active, able to  carry on all predisease activities without restriction)  1 - Symptomatic but completely ambulatory (Restricted in physically strenuous activity but ambulatory and able to carry out work of a light or sedentary nature. For example, light housework, office work)  2 - Symptomatic, <50% in bed during the day (Ambulatory and capable of all self care but unable to carry out any work activities. Up and about more than 50% of waking hours)  3 - Symptomatic, >50% in bed, but not bedbound (Capable of only limited self-care, confined to bed or chair 50% or more of waking hours)  4 - Bedbound (Completely disabled. Cannot carry on any self-care. Totally confined to bed or chair)  5 - Death   Eustace Pen MM, Creech RH, Tormey DC, et al. [phone]). "Toxicity and response  criteria of the [facility]". [place] Oncol. 5 (6): 649-55   LABORATORY DATA:   Lab Results  Component Value Date   TSH 5.204 (H) 09/17/2016       RADIOGRAPHY:  As above     IMPRESSION/PLAN:  This is a delightful patient with history of  head and neck cancer treated curatively in 2014. I recommend the following:  Surveillance: she is NED now.  According to NCCN guidelines, there is not an indication for yearly PETs.  Due to her smoking history I will order a CT chest low dose screening protocol to be done at her next followup in a year.  She will followup at the [facility] yearly with Mike Craze of survivorship and continue to see Dr Constance Holster of ENT.   Baseline lab taken today- TSH. This was abnormal after she left. I will adjust her supplement. Lab Results  Component Value Date   TSH 5.204 (H) 09/17/2016   Dermatology referral due to complaints  "Warts, cold sores, dry skin, boils."  Dental: edentulous  Swallowing: no active issues  Social: history of bipolar disorder, patient states she is doing well  Tobacco abuse: I asked the patient today about tobacco use. The patient uses tobacco.  I advised the patient to quit. Services were offered by me today including outpatient counseling and pharmacotherapy. I assessed for the willingness to attempt to quit and provided encouragement and demonstrated willingness to make referrals and/or prescriptions to help the patient attempt to quit.  She accepted information on [place] and declined outpatient counseling at our center as well as nicotine patches.   Planned Quit date - 09-23-17.  The patient has follow-up with the oncologic team to touch base on their tobacco use and /or cessation efforts.  Over 3 minutes were spent on this issue.    Advised to quit ETOH as well due to cancer risk.  She drinks about 3 beers a week  __________________________________________   Eppie Gibson, MD

## 2016-09-18 ENCOUNTER — Telehealth: Payer: Self-pay

## 2016-09-18 ENCOUNTER — Telehealth: Payer: Self-pay | Admitting: *Deleted

## 2016-09-18 NOTE — Telephone Encounter (Signed)
I called Lori Day and informed her that her Thyroid level drawn yesterday was sluggish. Dr. Isidore Moos would like for her to begin taking an increased dose of Levothyroxine 75 mcg on an empty stomach daily. [name] voiced her understanding and requested I call the prescription into the [facility] on [address] at the [address] of [address], which I did. She knows to call me if she has any further questions.

## 2016-09-18 NOTE — Telephone Encounter (Signed)
CALLED PATIENT TO INFORM OF APPT. WITH DR. Manuela Schwartz STEINHELFER ON 09-25-16- ARRIVAL TIME - 10:50 AM, SPOKE WITH PATIENT AND SHE IS AWARE OF THIS APPT.

## 2016-09-25 ENCOUNTER — Telehealth: Payer: Self-pay | Admitting: *Deleted

## 2016-09-25 DIAGNOSIS — L02224 Furuncle of groin: Secondary | ICD-10-CM | POA: Diagnosis not present

## 2016-09-25 DIAGNOSIS — L820 Inflamed seborrheic keratosis: Secondary | ICD-10-CM | POA: Diagnosis not present

## 2016-09-25 DIAGNOSIS — B0089 Other herpesviral infection: Secondary | ICD-10-CM | POA: Diagnosis not present

## 2016-09-25 DIAGNOSIS — B078 Other viral warts: Secondary | ICD-10-CM | POA: Diagnosis not present

## 2016-09-25 NOTE — Telephone Encounter (Signed)
Oncology Nurse Navigator Documentation  Per Dr. Pearlie Oyster guidance, called [facility] to arrange follow-up appointment.  Spoke with [facility], requested patient be contacted and routine follow-up appt arranged with Dr. Constance Holster in 6 months, to include TSH recheck.   She verbalized understanding.  Gayleen Orem, RN, BSN, [place] [facility] Oncology Nurse [facility] [place] [phone]

## 2016-10-01 DIAGNOSIS — M542 Cervicalgia: Secondary | ICD-10-CM | POA: Diagnosis not present

## 2016-10-01 DIAGNOSIS — M9902 Segmental and somatic dysfunction of thoracic region: Secondary | ICD-10-CM | POA: Diagnosis not present

## 2016-10-01 DIAGNOSIS — M50322 Other cervical disc degeneration at C5-C6 level: Secondary | ICD-10-CM | POA: Diagnosis not present

## 2016-10-01 DIAGNOSIS — M9901 Segmental and somatic dysfunction of cervical region: Secondary | ICD-10-CM | POA: Diagnosis not present

## 2016-10-01 DIAGNOSIS — M546 Pain in thoracic spine: Secondary | ICD-10-CM | POA: Diagnosis not present

## 2016-10-01 DIAGNOSIS — M62830 Muscle spasm of back: Secondary | ICD-10-CM | POA: Diagnosis not present

## 2016-10-02 DIAGNOSIS — M62830 Muscle spasm of back: Secondary | ICD-10-CM | POA: Diagnosis not present

## 2016-10-02 DIAGNOSIS — M546 Pain in thoracic spine: Secondary | ICD-10-CM | POA: Diagnosis not present

## 2016-10-02 DIAGNOSIS — M9901 Segmental and somatic dysfunction of cervical region: Secondary | ICD-10-CM | POA: Diagnosis not present

## 2016-10-02 DIAGNOSIS — M542 Cervicalgia: Secondary | ICD-10-CM | POA: Diagnosis not present

## 2016-10-02 DIAGNOSIS — M9902 Segmental and somatic dysfunction of thoracic region: Secondary | ICD-10-CM | POA: Diagnosis not present

## 2016-10-03 DIAGNOSIS — M546 Pain in thoracic spine: Secondary | ICD-10-CM | POA: Diagnosis not present

## 2016-10-03 DIAGNOSIS — M9901 Segmental and somatic dysfunction of cervical region: Secondary | ICD-10-CM | POA: Diagnosis not present

## 2016-10-03 DIAGNOSIS — M542 Cervicalgia: Secondary | ICD-10-CM | POA: Diagnosis not present

## 2016-10-03 DIAGNOSIS — M62830 Muscle spasm of back: Secondary | ICD-10-CM | POA: Diagnosis not present

## 2016-10-03 DIAGNOSIS — M9902 Segmental and somatic dysfunction of thoracic region: Secondary | ICD-10-CM | POA: Diagnosis not present

## 2016-10-05 ENCOUNTER — Other Ambulatory Visit: Payer: Self-pay | Admitting: Pulmonary Disease

## 2016-10-07 DIAGNOSIS — M546 Pain in thoracic spine: Secondary | ICD-10-CM | POA: Diagnosis not present

## 2016-10-07 DIAGNOSIS — M9902 Segmental and somatic dysfunction of thoracic region: Secondary | ICD-10-CM | POA: Diagnosis not present

## 2016-10-07 DIAGNOSIS — M542 Cervicalgia: Secondary | ICD-10-CM | POA: Diagnosis not present

## 2016-10-07 DIAGNOSIS — M9901 Segmental and somatic dysfunction of cervical region: Secondary | ICD-10-CM | POA: Diagnosis not present

## 2016-10-07 DIAGNOSIS — M62830 Muscle spasm of back: Secondary | ICD-10-CM | POA: Diagnosis not present

## 2016-10-09 DIAGNOSIS — M62830 Muscle spasm of back: Secondary | ICD-10-CM | POA: Diagnosis not present

## 2016-10-09 DIAGNOSIS — M542 Cervicalgia: Secondary | ICD-10-CM | POA: Diagnosis not present

## 2016-10-09 DIAGNOSIS — M9902 Segmental and somatic dysfunction of thoracic region: Secondary | ICD-10-CM | POA: Diagnosis not present

## 2016-10-09 DIAGNOSIS — M9901 Segmental and somatic dysfunction of cervical region: Secondary | ICD-10-CM | POA: Diagnosis not present

## 2016-10-09 DIAGNOSIS — M546 Pain in thoracic spine: Secondary | ICD-10-CM | POA: Diagnosis not present

## 2016-10-10 DIAGNOSIS — M9901 Segmental and somatic dysfunction of cervical region: Secondary | ICD-10-CM | POA: Diagnosis not present

## 2016-10-10 DIAGNOSIS — M542 Cervicalgia: Secondary | ICD-10-CM | POA: Diagnosis not present

## 2016-10-10 DIAGNOSIS — M9902 Segmental and somatic dysfunction of thoracic region: Secondary | ICD-10-CM | POA: Diagnosis not present

## 2016-10-10 DIAGNOSIS — M62830 Muscle spasm of back: Secondary | ICD-10-CM | POA: Diagnosis not present

## 2016-10-10 DIAGNOSIS — M546 Pain in thoracic spine: Secondary | ICD-10-CM | POA: Diagnosis not present

## 2016-10-14 DIAGNOSIS — M546 Pain in thoracic spine: Secondary | ICD-10-CM | POA: Diagnosis not present

## 2016-10-14 DIAGNOSIS — M9901 Segmental and somatic dysfunction of cervical region: Secondary | ICD-10-CM | POA: Diagnosis not present

## 2016-10-14 DIAGNOSIS — M542 Cervicalgia: Secondary | ICD-10-CM | POA: Diagnosis not present

## 2016-10-14 DIAGNOSIS — M62830 Muscle spasm of back: Secondary | ICD-10-CM | POA: Diagnosis not present

## 2016-10-14 DIAGNOSIS — M9902 Segmental and somatic dysfunction of thoracic region: Secondary | ICD-10-CM | POA: Diagnosis not present

## 2016-10-16 DIAGNOSIS — M542 Cervicalgia: Secondary | ICD-10-CM | POA: Diagnosis not present

## 2016-10-16 DIAGNOSIS — M546 Pain in thoracic spine: Secondary | ICD-10-CM | POA: Diagnosis not present

## 2016-10-16 DIAGNOSIS — M62830 Muscle spasm of back: Secondary | ICD-10-CM | POA: Diagnosis not present

## 2016-10-16 DIAGNOSIS — M9902 Segmental and somatic dysfunction of thoracic region: Secondary | ICD-10-CM | POA: Diagnosis not present

## 2016-10-16 DIAGNOSIS — M9901 Segmental and somatic dysfunction of cervical region: Secondary | ICD-10-CM | POA: Diagnosis not present

## 2016-12-31 ENCOUNTER — Ambulatory Visit (INDEPENDENT_AMBULATORY_CARE_PROVIDER_SITE_OTHER): Payer: Medicare Other | Admitting: Pulmonary Disease

## 2016-12-31 ENCOUNTER — Encounter: Payer: Self-pay | Admitting: Pulmonary Disease

## 2016-12-31 VITALS — BP 124/68 | HR 72 | Ht 68.5 in | Wt 190.6 lb

## 2016-12-31 DIAGNOSIS — C01 Malignant neoplasm of base of tongue: Secondary | ICD-10-CM

## 2016-12-31 DIAGNOSIS — G4734 Idiopathic sleep related nonobstructive alveolar hypoventilation: Secondary | ICD-10-CM | POA: Diagnosis not present

## 2016-12-31 DIAGNOSIS — F17210 Nicotine dependence, cigarettes, uncomplicated: Secondary | ICD-10-CM

## 2016-12-31 DIAGNOSIS — J432 Centrilobular emphysema: Secondary | ICD-10-CM

## 2016-12-31 MED ORDER — VARENICLINE TARTRATE 1 MG PO TABS: 1.0000 mg | ORAL_TABLET | Freq: Two times a day (BID) | ORAL | 0 refills | Status: DC

## 2016-12-31 MED ORDER — TIOTROPIUM BROMIDE-OLODATEROL 2.5-2.5 MCG/ACT IN AERS: 2.0000 | INHALATION_SPRAY | Freq: Every day | RESPIRATORY_TRACT | 4 refills | Status: DC

## 2016-12-31 NOTE — Patient Instructions (Signed)
Today we updated your med list in our EPIC system...    Continue your current medications the same...    We refilled your Stiolto per request...  OK to continue the [place] & continue to work on smoking cessation!!!  Stay as active as poss in a regular exercise program...  Call for any questions...  Let's plan a follow up visit in 71mo sooner if needed for problems..Marland KitchenMarland Kitchen

## 2016-12-31 NOTE — Progress Notes (Signed)
Subjective:     Patient ID: Lori Day, female   DOB: 1946/03/10, 70 y.o.   MRN: [id number]  HPI ~  April 18, 2015:  Initial visit w/ SN>  Her PCP is Animal nutritionist at [facility], [place]...      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       She relates a hx going back to 2009 in [place]- she had a sleep study- ?results, but she was started on CPAP; she subseq lost weight & improved so she stopped the CPAP on her own;  She was later re-tested and was said to have improved but she doesn't know the details;  Then in 2013 she had another abn sleep test & her physician restarted her CPAP (this was before her tongue cancer Dx);  She again stopped the CPAP in 2013 w/ the tongue cancer dx & treatment regimen of XRT & Chemo;  Then in 2015 she had a neuro eval by DrDohmeier (pt c/o pain & numbness in extremities, hx of pinched nerve, hx CTS, MRI neck w/ foraminal narrowing C5-C6) which included a home sleep study reported neg for OSA but she had nocturnal hypoxemia down to 70% sat she says and pt was referred to DrClance...       She saw DrClance 02/2015 & he reviewed her 08/2014 home sleep test- AHI=3.9 events/hr, lowest O2sat=82% (2hrs below 88%); she also has a hx COPD prev followed by a pulmonologist in [place]; still smoking as noted above; hx DOE w/ stairs and inclines or if rushing on level ground; denied much cough or sput; CXR showed COPD, NAD, and PFTs showed  mod airflow obstruction w/ decr DLCO (59%) and a reversible component;  He tried her on Stiolto- 2sp daily which she has taken over the last month- "about the same" she says (but still smoking)...      Current Meds>  Stiolto Respimat- 2 sprays once daily; Albuterol-HFA rescue inhaler prn, Chantix starter pack per her PCP, Zyrtek10/ Flonase Qhs...       EXAM reveals Afeb, VSS, O2sat=95% on RA at rest;  HEENT- denture, no lesions seen, mallampati2;  Chest- decr BS bilat w/o w/r/r;  Heart- RR w/o m/r/g;  Ext- neg w/o c/c/e...   CXR 03/15/15 showed norm heart size, COPD/emphysema, min atx/scarring at bases, NAD...  PFTs 03/16/15 showed FVC=3.26 (89%), FEV1=1.98 (71%), %1sec=61, mid-flows reduced at 43% predicted; after bronchodil FEV1 improved 16%; Lung volumes wnl; DLCO reduced at 59%... This is c/u mod airflow obstruction, emphysema, and a surprising reversible component...  ONO before considering nocturnal oxygen (Medicare doesn't accept oximetry from sleep studies)> pending=> O2sat<88% of 5h of the 7h study. IMP/PLAN>>  Lori Day has mod COPD/emphysema & a small reversible component; she needs to quit all smoking & use the Stiolto regularly, plus her prn rescue inhaler;  She has been found to have nocturnal hypoxemia but no signif  OSA at this time- we will proceed w/ her ONO to see if she needs nocturnal oxygen... ADDENDUM>>  ONO performed 06/15/15 w/ O2sat<88% for almost 5h of the total 7h study (there were 69 events or 9/hr;  She needs O2 at 2L/min Qhs and MUST QUIT ALL SMOKING!  ~  July 04, 2015:  2-42moROV w/ SN>  KCathyreports that she is improved on the SDarden Restaurants& hasn't needed her rescue inhaler in some time; she was able to quit smoking transiently w/ the help of Chantix but unfortunately she has restarted the smoking- now back to 1ppd; she understands the importance of smoking cessation in her overall health picture & recovery; we wrote for the Chantix maintenance packs...     Mod  COPD/emphysema w/ small reversible component> on Stiolto-2sp per day + AlbutHFA rescue inhaler; states she is improved on the Stiolto & hasn't needed the rescue inhaler recently; rec to continue the same...    Nocturnal hypoxemia> on O2 at 2L/min Qhs; several sleep tests w/o signif OSA...    Current cigarette smoker> she reports that Chantix helped her quit, was using e-cig, then went on a cruise & started cigarettes again- now back to 1ppd and "life is good" she says; we refilled the Chantix Rx for her...    Hx cancer at base of tongue> s/p XRT, chemoRx in [place] (she was HPV pos); rec to try Biotene for dry mouth    Other medical issues>> Hx GERD, Hx DJD, Hx Bipolar, Hx skin cancers... EXAM reveals Afeb, VSS, O2sat=92% on RA at rest;  HEENT- denture, no lesions seen, mallampati2;  Chest- decr BS bilat w/o w/r/r;  Heart- RR w/o m/r/g;  Ext- neg w/o c/c/e...  We reviewed prob list, meds, xrays and labs>> Given the 2016 Flu vaccine today; home O2 supplied by [facility]... IMP/PLAN>>  KTarinimust quit all smoking & stay quit; rec to continue the Stiolto, use Albut rescue inhaler as needed; use the nocturnal O2 at 2L/min Qhs; get on diet & increase exercise program... She will f/u in [place] if needed.  ~  January 01, 2016:  71m38moV & Lori Day is still smoking ~3cig/d AND taking Chantix;  She notes that her breathing is "great" and she continues on Stiolto- 2sp/d and AlbutHFA rescue inhaler but she hasn't needed this in months;  She also uses Home O2 Qhs due to nocturnal hypoxemia on ONO test- [facility];  She reports that [place] (no records avail to review- she says a nonfunctioning benign nodule) and she had a bout of Shingles in her left hip area 10/30/15 treated by her PCP & resolved... She reports that sister Dx w/ ovarian cancer- pt had genetic testing "I don't have any mutant genes" she says (Oncology note from  12/12/15 is reviewed)...  EXAM reveals Afeb, VSS, O2sat=94% on RA at rest;  HEENT- denture, no lesions seen, mallampati2;  Chest- decr BS bilat w/o w/r/r;  Heart- RR w/o m/r/g;  Abd- soft nontender neg;  Ext- neg w/o c/c/e...  IMP/PLAN>>  [facility];  We will refill her Stiolto;  Most important intervention is to QUILastrup  We plan ROV in 71mo54mooner if needed prn...   ~  July 03, 2016:  PCP= CourMarda Stalker; [place]/ her GOLD Stage2 COPD/ emphysema- back to  smoking 1ppd, on O2 at 2L/min Qhs ("it helps me sleep"), Stiolto- 2sp/d, AlbutHFA rescue prn, and Flonase/ claritin prn;  She did pretty well w/ Chantix & will try this again...  She had colonoscopy several wks ago by DrOutlaw- polyp removed & f/u planned in 5 yrs... We reviewed the following medical problems during today's office visit >>     Mod COPD/emphysema w/ small reversible component> on Stiolto-2sp per day + AlbutHFA rescue inhaler; states she is improved on the Stiolto & hasn't needed the rescue inhaler recently; rec to continue the same...    Nocturnal hypoxemia> on O2 at 2L/min Qhs; several sleep tests in the past w/o signif OSA...    Current cigarette smoker> she reports that Chantix helped her quit, was using e-cig, then went on a cruise & started cigarettes again- now back to 1ppd and "life is good" she says; we refilled the Chantix Rx for her...    Hx cancer at base of tongue> s/p XRT, chemoRx in [place] (she was HPV pos); rec to try Biotene for dry mouth    S/p thyroid surg in [place] 07/2015> no records avail to review...    Other medical issues>> Hx GERD, Hx DJD, Hx Bipolar, Hx skin cancers... EXAM reveals Afeb, VSS, O2sat=95% on RA at rest;  Wt down 17# to 179# today; HEENT- denture, no lesions seen, mallampati2;  Chest- decr BS bilat w/o w/r/r;  Heart- RR w/o m/r/g;  Ext- neg w/o c/c/e...   MRI Neck 04/10/16>  Asymmetric soft tissue at the right  tongue base may reflect post radiation change; no discrete mass, no adenopathy, mils asymmetric submandib glands R>L...  CXR 04/10/16>  Norm heart size, mild hyperinflation, scarring in upper lobes & bases- NAD, no adenopathy, boney demineralization is apparent... IMP/PLAN>>  Lori Day is her own worst Day- she must quit all smoking 7 stay quit, Chantix refilled, stay on Stiolto & Albut rescue, 2017 Flu shot given today...   ~  December 31, 2016:  44moROV & Lori Day continues to [facility];  She has COPD/emphysema on Stiolto, continued smoking, nocturnal hypoxemia, hx cancer at base of tongue... She denies CP, palpit, change in SOB/DOE, and denies cough/ sput/ hemoptysis, etc; we reviewed the following medical problems during today's office visit >>     Mod COPD/emphysema w/ small reversible component> on Stiolto-2sp per day + AlbutHFA rescue inhaler; states she is improved on the Stiolto & hasn't needed the rescue inhaler recently; rec to continue same; DrSquires has discussed low-dose screening CT program w/ her...    Nocturnal hypoxemia> on O2 at 2L/min Qhs; several sleep tests in the past w/o signif OSA...    Current cigarette smoker> prev back to 1ppd stating "life is good"; we refilled the Chantix Rx for her & she has decr to 3-4 cig/d she says & unable to quit due to stress...    Hx cancer at base of tongue> Dx 2014, s/p XRT (DrSquires follows her locally), chemoRx in [place] (she was HPV pos); rec to try Biotene for dry mouth; she sees DrRosen for ENT locally...    [place] avail to review; she is on Synthroid759m/d-- last TSH in Epic was 08/2016=5.20...    Other medical issues>> Hx GERD (on Prilosec), Hx DJD (on Glucosamine), Hx Bipolar (on Prozac), Hx skin cancers, Hearing loss... EXAM reveals Afeb, VSS, O2sat=97% on RA at rest;  Wt back up 13# to 191# today;  HEENT- denture, no lesions  seen, mallampati2;  Chest- decr BS bilat w/o w/r/r;  Heart- RR w/o m/r/g;  Ext- neg w/o c/c/e...  IMP/PLAN>>  Lori Day is stable w/ her COPD, hypoxemia- unfortunately continue smoking & we discussed this again, Chantix helped in ast 7 asked to restart; Stiolto reflled & we plan continue 47morov...    Past Medical History  Diagnosis Date    COPD/emphsema, cig smoker >>      Hx OSA, nocturnal hypoxemia >>    . 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 Thyroid nodule >> states [place] eval & [place], Bx=neg   . Hx of chronic sinusitis   . GERD (gastroesophageal reflux disease) >> on Omeprazole20   . Bipolar 1 disorder >> she reports off all meds x PROZAC20   . 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 Hiatal hernia   . History of HPV infection   . Glaucoma   . Sciatica   . Sensory hearing loss, bilateral   . Sjogren's syndrome 09-02-2007  . 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 Skin cancer w/ Moh's surg on right side of nose     Past Surgical History:  Procedure Laterality Date  . ABDOMINAL HYSTERECTOMY    . APPENDECTOMY  1959  . BREAST BIOPSY     three times, twice on Left, once on Right, diagnosis as calcifications 1994-1999  . CATARACT EXTRACTION Bilateral 05-29-10, 06-14-10  . CHOLECYSTECTOMY    . KNEE SURGERY  08-15-11   arthoscopy  . tongue cancer    . TONSILECTOMY, ADENOIDECTOMY, BILATERAL MYRINGOTOMY AND TUBES  1955  . TUBAL LIGATION  1981  . turbinate surgery     Dr. SNicole Cellain [place]    Outpatient Encounter Prescriptions as of 12/31/2016  Medication Sig  . Albuterol Sulfate (PROAIR RESPICLICK) 1161(90 BASE) MCG/ACT AEPB Inhale 2 puffs into the lungs every 6 (six) hours as needed.  .Marland Kitchenb complex vitamins tablet Bid po OTC  . cetirizine (ZYRTEC) 10 MG tablet Take 10 mg by mouth daily as  needed for allergies.  .Marland KitchenFLUoxetine (PROZAC) 20 MG capsule Take 20 mg by mouth.  . fluticasone (FLONASE) 50 MCG/ACT nasal spray Place 2 sprays into both nostrils daily.  .Marland KitchenGLUCOSA-CHONDR-NA CHONDR-MSM PO Take by mouth daily. W/ vit d  . levothyroxine (SYNTHROID, LEVOTHROID) 75 MCG tablet Take 1 tablet (75 mcg total) by mouth daily before breakfast.  . omeprazole (PRILOSEC) 20 MG capsule Take 20 mg by mouth daily.   . Tiotropium Bromide-Olodaterol (STIOLTO RESPIMAT) 2.5-2.5 MCG/ACT AERS Inhale 2 puffs into the lungs daily.  . TURMERIC PO Take by mouth.  . valACYclovir (VALTREX) 1000 MG tablet Take 1,000 mg by mouth 2 (two) times daily.  . varenicline (CHANTIX CONTINUING MONTH PAK) 1 MG tablet Take 1 tablet (1 mg total) by mouth 2 (two) times daily.  . [DISCONTINUED] CHANTIX CONTINUING MONTH PAK 1 MG tablet TAKE 1 TABLET (1 MG TOTAL) BY MOUTH 2 (TWO) TIMES DAILY.  . [DISCONTINUED] Tiotropium Bromide-Olodaterol (STIOLTO RESPIMAT) 2.5-2.5 MCG/ACT AERS Inhale 2 puffs into the lungs daily.  . [DISCONTINUED] metroNIDAZOLE (METROCREAM) 0.75 % cream Apply 1 application topically 2 (two) times daily. For rosacea   No facility-administered encounter medications on file as of 12/31/2016.     Allergies  Allergen Reactions  . Aspirin Swelling  . Compazine [Prochlorperazine Edisylate]     Slurred speech   . Pseudophed-Chlophedianol-Gg     Side effects  . Seroquel [Quetiapine Fumarate] Swelling  . Latex Rash  . Tape Rash    adhesive    Current Medications, Allergies, Past Medical History, Past Surgical History, Family History, and Social History were reviewed in [employer] record.   Review of Systems            All symptoms NEG except where BOLDED >>  Constitutional:  F/C/S, fatigue, anorexia, unexpected weight change. HEENT:  HA, visual changes, hearing loss, earache, nasal symptoms, sore throat, mouth sores, hoarseness. Resp:  cough, sputum, hemoptysis; SOB, tightness,  wheezing. Cardio:  CP, palpit, DOE, orthopnea, edema. GI:  N/V/D/C, blood in stool; reflux, abd pain, distention, gas. GU:  dysuria, freq, urgency, hematuria, flank pain, voiding difficulty. MS:  joint pain, swelling, tenderness, decr ROM; neck pain, back pain, etc. Neuro:  HA, tremors, seizures, dizziness, syncope, weakness, numbness, gait abn. Skin:  suspicious lesions or skin rash. Heme:  adenopathy, bruising, bleeding. Psyche:  confusion, agitation, sleep disturbance, hallucinations, anxiety, depression suicidal.   Objective:   Physical Exam      Vital Signs:  Reviewed...   General:  WD, WN, 71 y/o WF in NAD; alert & oriented; pleasant & cooperative... HEENT:  [place]/AT; Conjunctiva- pink, Sclera- nonicteric, EOM-wnl, PERRLA, EACs-clear, TMs-wnl; NOSE-clear; THROAT- dentures, neg w/o lesions seen.  Neck:  Supple w/ fair ROM; no JVD; normal carotid impulses w/o bruits; no thyromegaly or nodules palpated; no lymphadenopathy.  Chest:  decr BS bilat without wheezes, rales, or rhonchi heard. Heart:  Regular Rhythm; norm S1 & S2 without murmurs, rubs, or gallops detected. Abdomen:  Soft & nontender- no guarding or rebound; normal bowel sounds; no organomegaly or masses palpated. Ext:  Normal ROM; without deformities or arthritic changes; no varicose veins, venous insuffic, or edema;  Pulses intact w/o bruits. Neuro:  No focal neuro deficit; gait normal & balance OK. Derm:  No lesions noted; no rash etc. Lymph:  No cervical, supraclavicular, axillary, or inguinal adenopathy palpated.   Assessment:      IMP >>  1)  Mod COPD/emphysema w/ small reversible component-- on Stiolto-2sp per day + AlbutHFA rescue inhaler... 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 Other medical issues>>  Hx GERD, Hx DJD, Hx Bipolar, Hx skin cancers   PLAN >>  04/18/15>  Jiselle has mod  COPD/emphysema & a small reversible component; she needs to quit all smoking & use the Stiolto regularly, plus her prn rescue inhaler;  She has been found to have nocturnal hypoxemia but no signif OSA at this time- we will proceed w/ her ONO to see if she needs nocturnal oxygen... 07/04/15>  Myna must quit all smoking & stay quit; rec to continue the Stiolto, use Albut rescue inhaler as needed; use the nocturnal O2 at 2L/min Qhs; get on diet & increase exercise program... She will f/u in 72mo sooner if needed. 01/01/16>  [facility];  We will refill her Stiolto;  Most important intervention is to [facility]!!  We plan ROV in 653mosooner if needed prn 07/03/16>  Lori Day- she must quit all smoking 7 stay quit, Chantix refilled, stay on Stiolto &  Albut rescue, 2017 Flu shot given today... 12/31/16>  Elli is stable w/ her COPD, hypoxemia- unfortunately continue smoking & we discussed this again, Chantix helped in ast 7 asked to restart; Stiolto reflled & we plan continue 66morov     Plan:     Patient's Medications  New Prescriptions   No medications on file  Previous Medications   ALBUTEROL SULFATE (PROAIR RESPICLICK) 1092(90 BASE) MCG/ACT AEPB    Inhale 2 puffs into the lungs every 6 (six) hours as needed.   B COMPLEX VITAMINS TABLET    Bid po OTC   CETIRIZINE (ZYRTEC) 10 MG TABLET    Take 10 mg by mouth daily as needed for allergies.   FLUOXETINE (PROZAC) 20 MG CAPSULE    Take 20 mg by mouth.   FLUTICASONE (FLONASE) 50 MCG/ACT NASAL SPRAY    Place 2 sprays into both nostrils daily.   GLUCOSA-CHONDR-NA CHONDR-MSM PO    Take by mouth daily. W/ vit d   LEVOTHYROXINE (SYNTHROID, LEVOTHROID) 75 MCG TABLET    Take 1 tablet (75 mcg total) by mouth daily before breakfast.   OMEPRAZOLE (PRILOSEC) 20 MG CAPSULE    Take 20 mg by mouth daily.    TURMERIC PO    Take by mouth.   VALACYCLOVIR (VALTREX) 1000 MG TABLET    Take 1,000 mg by mouth 2  (two) times daily.  Modified Medications   Modified Medication Previous Medication   TIOTROPIUM BROMIDE-OLODATEROL (STIOLTO RESPIMAT) 2.5-2.5 MCG/ACT AERS Tiotropium Bromide-Olodaterol (STIOLTO RESPIMAT) 2.5-2.5 MCG/ACT AERS      Inhale 2 puffs into the lungs daily.    Inhale 2 puffs into the lungs daily.   VARENICLINE (CHANTIX CONTINUING MONTH PAK) 1 MG TABLET CHANTIX CONTINUING MONTH PAK 1 MG tablet      Take 1 tablet (1 mg total) by mouth 2 (two) times daily.    TAKE 1 TABLET (1 MG TOTAL) BY MOUTH 2 (TWO) TIMES DAILY.  Discontinued Medications   METRONIDAZOLE (METROCREAM) 0.75 % CREAM    Apply 1 application topically 2 (two) times daily. For rosacea

## 2017-01-31 ENCOUNTER — Other Ambulatory Visit: Payer: Self-pay | Admitting: Pulmonary Disease

## 2017-02-14 DIAGNOSIS — J329 Chronic sinusitis, unspecified: Secondary | ICD-10-CM | POA: Diagnosis not present

## 2017-02-14 DIAGNOSIS — K219 Gastro-esophageal reflux disease without esophagitis: Secondary | ICD-10-CM | POA: Diagnosis not present

## 2017-02-20 DIAGNOSIS — L02224 Furuncle of groin: Secondary | ICD-10-CM | POA: Diagnosis not present

## 2017-02-27 DIAGNOSIS — Z85819 Personal history of malignant neoplasm of unspecified site of lip, oral cavity, and pharynx: Secondary | ICD-10-CM | POA: Diagnosis not present

## 2017-02-27 DIAGNOSIS — K219 Gastro-esophageal reflux disease without esophagitis: Secondary | ICD-10-CM | POA: Diagnosis not present

## 2017-02-27 DIAGNOSIS — Z923 Personal history of irradiation: Secondary | ICD-10-CM | POA: Diagnosis not present

## 2017-02-27 DIAGNOSIS — F17200 Nicotine dependence, unspecified, uncomplicated: Secondary | ICD-10-CM | POA: Diagnosis not present

## 2017-03-01 ENCOUNTER — Other Ambulatory Visit: Payer: Self-pay | Admitting: Pulmonary Disease

## 2017-03-27 DIAGNOSIS — J029 Acute pharyngitis, unspecified: Secondary | ICD-10-CM | POA: Diagnosis not present

## 2017-04-05 ENCOUNTER — Other Ambulatory Visit: Payer: Self-pay | Admitting: Radiation Oncology

## 2017-04-05 DIAGNOSIS — E890 Postprocedural hypothyroidism: Secondary | ICD-10-CM

## 2017-04-07 NOTE — Telephone Encounter (Signed)
Request for refill

## 2017-04-10 DIAGNOSIS — K219 Gastro-esophageal reflux disease without esophagitis: Secondary | ICD-10-CM | POA: Diagnosis not present

## 2017-04-10 DIAGNOSIS — Z13820 Encounter for screening for osteoporosis: Secondary | ICD-10-CM | POA: Diagnosis not present

## 2017-04-10 DIAGNOSIS — Z131 Encounter for screening for diabetes mellitus: Secondary | ICD-10-CM | POA: Diagnosis not present

## 2017-04-10 DIAGNOSIS — Z136 Encounter for screening for cardiovascular disorders: Secondary | ICD-10-CM | POA: Diagnosis not present

## 2017-04-10 DIAGNOSIS — E890 Postprocedural hypothyroidism: Secondary | ICD-10-CM | POA: Diagnosis not present

## 2017-04-10 DIAGNOSIS — J449 Chronic obstructive pulmonary disease, unspecified: Secondary | ICD-10-CM | POA: Diagnosis not present

## 2017-04-10 DIAGNOSIS — Z0000 Encounter for general adult medical examination without abnormal findings: Secondary | ICD-10-CM | POA: Diagnosis not present

## 2017-04-10 DIAGNOSIS — F3130 Bipolar disorder, current episode depressed, mild or moderate severity, unspecified: Secondary | ICD-10-CM | POA: Diagnosis not present

## 2017-04-24 DIAGNOSIS — K219 Gastro-esophageal reflux disease without esophagitis: Secondary | ICD-10-CM | POA: Diagnosis not present

## 2017-04-24 DIAGNOSIS — R1314 Dysphagia, pharyngoesophageal phase: Secondary | ICD-10-CM | POA: Diagnosis not present

## 2017-04-24 DIAGNOSIS — Z85819 Personal history of malignant neoplasm of unspecified site of lip, oral cavity, and pharynx: Secondary | ICD-10-CM | POA: Diagnosis not present

## 2017-04-24 DIAGNOSIS — Z923 Personal history of irradiation: Secondary | ICD-10-CM | POA: Diagnosis not present

## 2017-04-25 ENCOUNTER — Other Ambulatory Visit: Payer: Self-pay | Admitting: Otolaryngology

## 2017-04-25 DIAGNOSIS — K219 Gastro-esophageal reflux disease without esophagitis: Secondary | ICD-10-CM

## 2017-04-25 DIAGNOSIS — Z923 Personal history of irradiation: Secondary | ICD-10-CM

## 2017-04-25 DIAGNOSIS — Z85819 Personal history of malignant neoplasm of unspecified site of lip, oral cavity, and pharynx: Secondary | ICD-10-CM

## 2017-04-25 DIAGNOSIS — R1314 Dysphagia, pharyngoesophageal phase: Secondary | ICD-10-CM

## 2017-05-02 ENCOUNTER — Ambulatory Visit
Admission: RE | Admit: 2017-05-02 | Discharge: 2017-05-02 | Disposition: A | Discharge status: Home / Self Care | Payer: Medicare Other | Source: Ambulatory Visit | Attending: Otolaryngology | Admitting: Otolaryngology

## 2017-05-02 DIAGNOSIS — Z923 Personal history of irradiation: Secondary | ICD-10-CM

## 2017-05-02 DIAGNOSIS — K219 Gastro-esophageal reflux disease without esophagitis: Secondary | ICD-10-CM | POA: Diagnosis not present

## 2017-05-02 DIAGNOSIS — R1314 Dysphagia, pharyngoesophageal phase: Secondary | ICD-10-CM

## 2017-05-02 DIAGNOSIS — Z85819 Personal history of malignant neoplasm of unspecified site of lip, oral cavity, and pharynx: Secondary | ICD-10-CM

## 2017-05-21 DIAGNOSIS — M810 Age-related osteoporosis without current pathological fracture: Secondary | ICD-10-CM | POA: Diagnosis not present

## 2017-05-21 DIAGNOSIS — E2839 Other primary ovarian failure: Secondary | ICD-10-CM | POA: Diagnosis not present

## 2017-06-10 DIAGNOSIS — Z79899 Other long term (current) drug therapy: Secondary | ICD-10-CM | POA: Diagnosis not present

## 2017-06-10 DIAGNOSIS — Z8781 Personal history of (healed) traumatic fracture: Secondary | ICD-10-CM | POA: Diagnosis not present

## 2017-06-10 DIAGNOSIS — Z87891 Personal history of nicotine dependence: Secondary | ICD-10-CM | POA: Diagnosis not present

## 2017-06-10 DIAGNOSIS — Z1321 Encounter for screening for nutritional disorder: Secondary | ICD-10-CM | POA: Diagnosis not present

## 2017-06-10 DIAGNOSIS — M818 Other osteoporosis without current pathological fracture: Secondary | ICD-10-CM | POA: Diagnosis not present

## 2017-06-26 ENCOUNTER — Ambulatory Visit (INDEPENDENT_AMBULATORY_CARE_PROVIDER_SITE_OTHER): Payer: Medicare Other | Admitting: Pulmonary Disease

## 2017-06-26 ENCOUNTER — Encounter: Payer: Self-pay | Admitting: Pulmonary Disease

## 2017-06-26 ENCOUNTER — Ambulatory Visit (INDEPENDENT_AMBULATORY_CARE_PROVIDER_SITE_OTHER)
Admission: RE | Admit: 2017-06-26 | Discharge: 2017-06-26 | Disposition: A | Discharge status: Home / Self Care | Payer: Medicare Other | Source: Ambulatory Visit | Attending: Pulmonary Disease | Admitting: Pulmonary Disease

## 2017-06-26 VITALS — BP 104/62 | HR 70 | Temp 98.0°F | Ht 68.5 in | Wt 198.2 lb

## 2017-06-26 DIAGNOSIS — J439 Emphysema, unspecified: Secondary | ICD-10-CM | POA: Diagnosis not present

## 2017-06-26 DIAGNOSIS — F17210 Nicotine dependence, cigarettes, uncomplicated: Secondary | ICD-10-CM

## 2017-06-26 DIAGNOSIS — C01 Malignant neoplasm of base of tongue: Secondary | ICD-10-CM

## 2017-06-26 DIAGNOSIS — Z8709 Personal history of other diseases of the respiratory system: Secondary | ICD-10-CM | POA: Diagnosis not present

## 2017-06-26 DIAGNOSIS — G4734 Idiopathic sleep related nonobstructive alveolar hypoventilation: Secondary | ICD-10-CM | POA: Diagnosis not present

## 2017-06-26 NOTE — Patient Instructions (Signed)
Today we updated your med list in our EPIC system...    Continue your current medications the same...  Today we checked a follow up CXR...    We will contact you w/ the results when available...   We also gave you the 2018 FLU vaccine...  We will arrange for a referral to the [place] program...  Call for any questions...  Let's plan a follow up visit in 31mo, sooner if needed for problems.Marland KitchenMarland Kitchen

## 2017-06-26 NOTE — Progress Notes (Signed)
Subjective:     Patient ID: Lori Day, female   DOB: 1946/06/12, 71 y.o.   MRN: [id number]  HPI  ~  April 18, 2015:  Initial visit w/ SN>  Her PCP is Animal nutritionist at [facility], [place]...      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       She relates a hx going back to 2009 in [place]- she had a sleep study- ?results, but she was started on CPAP; she subseq lost weight & improved so she stopped the CPAP on her own;  She was later re-tested and was said to have improved but she doesn't know the details;  Then in 2013 she had another abn sleep test & her physician restarted her CPAP (this was before her tongue cancer Dx);  She again stopped the CPAP in 2013 w/ the tongue cancer dx & treatment regimen of XRT & Chemo;  Then in 2015 she had a neuro eval by DrDohmeier (pt c/o pain & numbness in extremities, hx of pinched nerve, hx CTS, MRI neck w/ foraminal narrowing C5-C6) which included a home sleep study reported neg for OSA but she had nocturnal hypoxemia down to 70% sat she says and pt was referred to DrClance...       She saw DrClance 02/2015 & he reviewed her 08/2014 home sleep test- AHI=3.9 events/hr, lowest O2sat=82% (2hrs below 88%); she also has a hx COPD prev followed by a pulmonologist in [place]; still smoking as noted above; hx DOE w/ stairs and inclines or if rushing on level ground; denied much cough or sput; CXR showed COPD, NAD, and PFTs showed  mod airflow obstruction w/ decr DLCO (59%) and a reversible component;  He tried her on Stiolto- 2sp daily which she has taken over the last month- "about the same" she says (but still smoking)...      Current Meds>  Stiolto Respimat- 2 sprays once daily; Albuterol-HFA rescue inhaler prn, Chantix starter pack per her PCP, Zyrtek10/ Flonase Qhs...       EXAM reveals Afeb, VSS, O2sat=95% on RA at rest;  HEENT- denture, no lesions seen, mallampati2;  Chest- decr BS bilat w/o w/r/r;  Heart- RR w/o m/r/g;  Ext- neg w/o c/c/e...   CXR 03/15/15 showed norm heart size, COPD/emphysema, min atx/scarring at bases, NAD...  PFTs 03/16/15 showed FVC=3.26 (89%), FEV1=1.98 (71%), %1sec=61, mid-flows reduced at 43% predicted; after bronchodil FEV1 improved 16%; Lung volumes wnl; DLCO reduced at 59%... This is c/u mod airflow obstruction, emphysema, and a surprising reversible component...  ONO before considering nocturnal oxygen (Medicare doesn't accept oximetry from sleep studies)> pending=> O2sat<88% of 5h of the 7h study. IMP/PLAN>>  Lori Day has mod COPD/emphysema & a small reversible component; she needs to quit all smoking & use the Stiolto regularly, plus her prn rescue inhaler;  She has been found to have nocturnal hypoxemia but no  signif OSA at this time- we will proceed w/ her ONO to see if she needs nocturnal oxygen... ADDENDUM>>  ONO performed 06/15/15 w/ O2sat<88% for almost 5h of the total 7h study (there were 69 events or 9/hr;  She needs O2 at 2L/min Qhs and MUST QUIT ALL SMOKING!  ~  July 04, 2015:  2-39mo ROV w/ SN>  Lori Day reports that she is improved on the [employer] & hasn't needed her rescue inhaler in some time; she was able to quit smoking transiently w/ the help of Chantix but unfortunately she has restarted the smoking- now back to 1ppd; she understands the importance of smoking cessation in her overall health picture & recovery; we wrote for the Chantix maintenance packs...     Mod  COPD/emphysema w/ small reversible component> on Stiolto-2sp per day + AlbutHFA rescue inhaler; states she is improved on the Stiolto & hasn't needed the rescue inhaler recently; rec to continue the same...    Nocturnal hypoxemia> on O2 at 2L/min Qhs; several sleep tests w/o signif OSA...    Current cigarette smoker> she reports that Chantix helped her quit, was using e-cig, then went on a cruise & started cigarettes again- now back to 1ppd and "life is good" she says; we refilled the Chantix Rx for her...    Hx cancer at base of tongue> s/p XRT, chemoRx in [place] (she was HPV pos); rec to try Biotene for dry mouth    Other medical issues>> Hx GERD, Hx DJD, Hx Bipolar, Hx skin cancers... EXAM reveals Afeb, VSS, O2sat=92% on RA at rest;  HEENT- denture, no lesions seen, mallampati2;  Chest- decr BS bilat w/o w/r/r;  Heart- RR w/o m/r/g;  Ext- neg w/o c/c/e...  We reviewed prob list, meds, xrays and labs>> Given the 2016 Flu vaccine today; home O2 supplied by [facility]... IMP/PLAN>>  Lori Day must quit all smoking & stay quit; rec to continue the Stiolto, use Albut rescue inhaler as needed; use the nocturnal O2 at 2L/min Qhs; get on diet & increase exercise program... She will f/u in 52mo, sooner if needed.  ~  January 01, 2016:  39mo ROV & Lori Day is still smoking ~3cig/d AND taking Chantix;  She notes that her breathing is "great" and she continues on Stiolto- 2sp/d and AlbutHFA rescue inhaler but she hasn't needed this in months;  She also uses Home O2 Qhs due to nocturnal hypoxemia on ONO test- she gets this from [place] but wants to switch to [facility] she says;  She reports that she had Thyroid surg in [place] 08/28/15 (no records avail to review- she says a nonfunctioning benign nodule) and she had a bout of Shingles in her left hip area 10/30/15 treated by her PCP & resolved... She reports that sister Dx w/ ovarian cancer- pt had genetic testing "I don't have any mutant genes" she says (Oncology note from  12/12/15 is reviewed)...  EXAM reveals Afeb, VSS, O2sat=94% on RA at rest;  HEENT- denture, no lesions seen, mallampati2;  Chest- decr BS bilat w/o w/r/r;  Heart- RR w/o m/r/g;  Abd- soft nontender neg;  Ext- neg w/o c/c/e...  IMP/PLAN>>  Pt wants to change her DME to [facility]- ok w/ me;  We will refill her Stiolto;  Most important intervention is to [place]!!!  We plan ROV in 64mo, sooner if needed prn...   ~  July 03, 2016:  PCP= Marda Stalker, [place];  6 month Talbot is stable w/ her GOLD Stage2 COPD/ emphysema- back  to smoking 1ppd, on O2 at 2L/min Qhs ("it helps me sleep"), Stiolto- 2sp/d, AlbutHFA rescue prn, and Flonase/ claritin prn;  She did pretty well w/ Chantix & will try this again...  She had colonoscopy several wks ago by DrOutlaw- polyp removed & f/u planned in 5 yrs... We reviewed the following medical problems during today's office visit >>     Mod COPD/emphysema w/ small reversible component> on Stiolto-2sp per day + AlbutHFA rescue inhaler; states she is improved on the Stiolto & hasn't needed the rescue inhaler recently; rec to continue the same...    Nocturnal hypoxemia> on O2 at 2L/min Qhs; several sleep tests in the past w/o signif OSA...    Current cigarette smoker> she reports that Chantix helped her quit, was using e-cig, then went on a cruise & started cigarettes again- now back to 1ppd and "life is good" she says; we refilled the Chantix Rx for her...    Hx cancer at base of tongue> s/p XRT, chemoRx in [place] (she was HPV pos); rec to try Biotene for dry mouth    S/p thyroid surg in [place] 07/2015> no records avail to review...    Other medical issues>> Hx GERD, Hx DJD, Hx Bipolar, Hx skin cancers... EXAM reveals Afeb, VSS, O2sat=95% on RA at rest;  Wt down 17# to 179# today; HEENT- denture, no lesions seen, mallampati2;  Chest- decr BS bilat w/o w/r/r;  Heart- RR w/o m/r/g;  Ext- neg w/o c/c/e...   MRI Neck 04/10/16>  Asymmetric soft tissue at the right  tongue base may reflect post radiation change; no discrete mass, no adenopathy, mils asymmetric submandib glands R>L...  CXR 04/10/16>  Norm heart size, mild hyperinflation, scarring in upper lobes & bases- NAD, no adenopathy, boney demineralization is apparent... IMP/PLAN>>  Aleria is her own worst enemy- she must quit all smoking 7 stay quit, Chantix refilled, stay on Stiolto & Albut rescue, 2017 Flu shot given today...  ~  December 31, 2016:  90mo ROV & Joyanna continues to get her general medical care thru [facility], [place]);  She has COPD/emphysema on Stiolto, continued smoking, nocturnal hypoxemia, hx cancer at base of tongue... She denies CP, palpit, change in SOB/DOE, and denies cough/ sput/ hemoptysis, etc; we reviewed the following medical problems during today's office visit >>     Mod COPD/emphysema w/ small reversible component> on Stiolto-2sp per day + AlbutHFA rescue inhaler; states she is improved on the Stiolto & hasn't needed the rescue inhaler recently; rec to continue same; DrSquires has discussed low-dose screening CT program w/ her...    Nocturnal hypoxemia> on O2 at 2L/min Qhs; several sleep tests in the past w/o signif OSA...    Current cigarette smoker> prev back to 1ppd stating "life is good"; we refilled the Chantix Rx for her & she has decr to 3-4 cig/d she says & unable to quit due to stress...    Hx cancer at base of tongue> Dx 2014, s/p XRT (DrSquires follows her locally), chemoRx in [place] (she was HPV pos); rec to try Biotene for dry mouth; she sees DrRosen for ENT locally...    S/p thyroid surg in [place] 07/2015> no records avail to review; she is on Synthroid70mcg/d-- last TSH in Epic was 08/2016=5.20...    Other medical issues>> Hx GERD (on Prilosec), Hx DJD (on Glucosamine), Hx Bipolar (on Prozac), Hx skin cancers, Hearing loss... EXAM reveals Afeb, VSS, O2sat=97% on RA at rest;  Wt back up 13# to 191# today;  HEENT- denture, no lesions  seen, mallampati2;  Chest- decr BS bilat w/o w/r/r;  Heart- RR w/o m/r/g;  Ext- neg w/o c/c/e...  IMP/PLAN>>  Dalesha is stable w/ her COPD, hypoxemia- unfortunately continue smoking & we discussed this again, Chantix helped in ast 7 asked to restart; Stiolto reflled & we plan continue 37mo rov...   ~  June 26, 2017:  81mo ROV & Sabah reports that she quit smoking 02/25/17 & has been off the Cahntix for the last 2wks;  Her breathing is improved as a result & she feels that she can tell a difference having quit smoking!  She notes mild intermit cough, small amt thick beige sput, no hemoptysis; SOB/DOE is about the same eg-walking up hill & she is NOT exercising (nect step=> refer to pulm rehab)... [place] Flu vaccine today... 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 2) Mild cricopharyngeus muscle dysfunction, characteristic of chronic gastroesophageal reflux disease. 3) Mild esophageal dysmotility, with a pattern characteristic of reflux related dysmotility. 4) Tiny sliding hiatal hernia.  Mild gastroesophageal reflux. 5) No evidence of reflux esophagitis. No evidence of esophageal mass, stricture or ulcer.    She went to [facility] on 06/10/17>  Hx osteoporosis (Tscore-3.5 in [place] & -2.5 in [place]) & compression fx of T12; they rec lowest PPI dose needed & poss change to H2Blocker if able; they did labs- SPE was wnl, Chems- wnl (Ca=9.6), PTH- wnl, VitD=22.7 (30-100);  Pt rec to take calcium, glucosamine, consider Reclast vs Prolia...  We reviewed the following medical problems during today's office visit >>     Mod COPD/emphysema w/ small reversible  component> on Stiolto-2sp per day + Proair-tHFA rescue inhaler; states she is improved on the Stiolto & hasn't needed the rescue inhaler recently; rec to continue same; DrSquires has discussed low-dose screening CT program w/ pt in the past...    Nocturnal hypoxemia> on O2 at 2L/min Qhs; several sleep tests in the past w/o signif OSA...    Ex-cigarette smoker> prev 1ppd stating "life is good"; we refilled the Chantix Rx for her & she has decr to 3-4 cig/d & was able to [facility] 02/25/17...    Hx cancer at base of tongue> Dx 2014, s/p XRT (DrSquires follows her locally), chemoRx in [place] (she was HPV pos); rec to try Biotene for dry mouth; she sees DrRosen for ENT...    S/p thyroid surg in [place] 07/2015> no records avail to review; she is on Synthroid4mcg/d-- last TSH in Epic was 08/2016=5.20  (PCP is Marda Stalker, [place] at [facility])    Other medical issues>> Hx GERD (on Prilosec20Qod & Zantac150Qod), Hx DJD (on Glucosamine), Osteoporosis (seen in their clinic), Hx Bipolar (on Prozac), Hx skin cancers, Hearing loss... EXAM reveals Afeb, VSS, O2sat=95% on RA at rest;  Wt back up 8# to 198# today; HEENT- denture, no lesions seen, mallampati2;  Chest- decr BS bilat w/o w/r/r;  Heart- RR w/o m/r/g;  Ext- neg w/o c/c/e...   CXR 06/26/17 (independently reviewed by me in the PACS system) showed norm heart size, aortic atherosclerosis, coarse interstitial markings & mild basilar atx- NAD, DDD in [facility]... IMP/PLAN>>  Acelyn has quit  smoking- congrats;  We will refer to [facility];  rec to continue same meds;  We will continue Q98mo rov's and sooner if needed prn...    Past Medical History  Diagnosis Date    COPD/emphsema, cig smoker >>      Hx OSA, nocturnal hypoxemia >>    . 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 Thyroid nodule >> states she had eval & f/u in  [place], Bx=neg   . Hx of chronic sinusitis   . GERD (gastroesophageal reflux disease) >> on Omeprazole20   . Bipolar 1 disorder >> she reports off all meds x PROZAC20   . 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 Hiatal hernia   . History of HPV infection   . Glaucoma   . Sciatica   . Sensory hearing loss, bilateral   . Sjogren's syndrome 09-02-2007  . 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 Skin cancer w/ Moh's surg on right side of nose     Past Surgical History:  Procedure Laterality Date  . ABDOMINAL HYSTERECTOMY    . APPENDECTOMY  1959  . BREAST BIOPSY     three times, twice on Left, once on Right, diagnosis as calcifications 1994-1999  . CATARACT EXTRACTION Bilateral 05-29-10, 06-14-10  . CHOLECYSTECTOMY    . KNEE SURGERY  08-15-11   arthoscopy  . tongue cancer    . TONSILECTOMY, ADENOIDECTOMY, BILATERAL MYRINGOTOMY AND TUBES  1955  . TUBAL LIGATION  1981  . turbinate surgery     Dr. Nicole Cella in [place].    Outpatient Encounter Prescriptions as of 06/26/2017  Medication Sig  . Albuterol Sulfate (PROAIR RESPICLICK) 469 (90 BASE) MCG/ACT AEPB Inhale 2 puffs into the lungs every 6 (six) hours as needed.  Marland Kitchen b complex vitamins tablet Bid po OTC  . calcium carbonate (CALCIUM 600) 600 MG TABS tablet Take 600 mg by mouth daily with breakfast.  . cetirizine (ZYRTEC) 10 MG tablet Take 10 mg by mouth daily as needed for allergies.  . Cholecalciferol (HM VITAMIN D3) 4000 units CAPS Take 1 capsule by mouth daily.  Marland Kitchen FLUoxetine (PROZAC) 20 MG capsule Take 20 mg by mouth.  . fluticasone (FLONASE) 50 MCG/ACT nasal spray Place 2 sprays into both nostrils daily.  Marland Kitchen GLUCOSA-CHONDR-NA CHONDR-MSM PO Take by mouth daily. W/ vit d  . levothyroxine (SYNTHROID, LEVOTHROID) 75 MCG tablet Take 1 tablet (75 mcg total) by mouth daily before breakfast.  . omeprazole (PRILOSEC) 20 MG capsule Take 20 mg by mouth every other day.   . ranitidine (ZANTAC) 150  MG tablet Take 150 mg by mouth every other day.  . Tiotropium Bromide-Olodaterol (STIOLTO RESPIMAT) 2.5-2.5 MCG/ACT AERS Inhale 2 puffs into the lungs daily.  . TURMERIC PO Take by mouth.  . valACYclovir (VALTREX) 1000 MG tablet Take 1,000 mg by mouth 2 (two) times daily.  . [DISCONTINUED] CHANTIX CONTINUING MONTH PAK 1 MG tablet TAKE 1 TABLET (1 MG TOTAL) BY MOUTH 2 (TWO) TIMES DAILY. (Patient not taking: Reported on 06/26/2017)   No facility-administered encounter medications on file as of 06/26/2017.     Allergies  Allergen Reactions  . Aspirin Swelling  . Compazine [Prochlorperazine Edisylate]     Slurred speech   . Pseudophed-Chlophedianol-Gg     Side effects  . Seroquel [Quetiapine Fumarate]  Swelling  . Latex Rash  . Tape Rash    adhesive    Immunization History  Administered Date(s) Administered  . Influenza,inj,Quad PF,6+ Mos 07/04/2015, 07/03/2016  . Pneumococcal Conjugate-13 06/29/2011  . Pneumococcal Polysaccharide-23 10/28/2013    Current Medications, Allergies, Past Medical History, Past Surgical History, Family History, and Social History were reviewed in [employer] record.   Review of Systems            All symptoms NEG except where BOLDED >>  Constitutional:  F/C/S, fatigue, anorexia, unexpected weight change. HEENT:  HA, visual changes, hearing loss, earache, nasal symptoms, sore throat, mouth sores, hoarseness. Resp:  cough, sputum, hemoptysis; SOB, tightness, wheezing. Cardio:  CP, palpit, DOE, orthopnea, edema. GI:  N/V/D/C, blood in stool; reflux, abd pain, distention, gas. GU:  dysuria, freq, urgency, hematuria, flank pain, voiding difficulty. MS:  joint pain, swelling, tenderness, decr ROM; neck pain, back pain, etc. Neuro:  HA, tremors, seizures, dizziness, syncope, weakness, numbness, gait abn. Skin:  suspicious lesions or skin rash. Heme:  adenopathy, bruising, bleeding. Psyche:  confusion, agitation, sleep disturbance,  hallucinations, anxiety, depression suicidal.   Objective:   Physical Exam      Vital Signs:  Reviewed...   General:  WD, WN, 71 y/o WF in NAD; alert & oriented; pleasant & cooperative... HEENT:  [place]/AT; Conjunctiva- pink, Sclera- nonicteric, EOM-wnl, PERRLA, EACs-clear, TMs-wnl; NOSE-clear; THROAT- dentures, neg w/o lesions seen.  Neck:  Supple w/ fair ROM; no JVD; normal carotid impulses w/o bruits; no thyromegaly or nodules palpated; no lymphadenopathy.  Chest:  decr BS bilat without wheezes, rales, or rhonchi heard. Heart:  Regular Rhythm; norm S1 & S2 without murmurs, rubs, or gallops detected. Abdomen:  Soft & nontender- no guarding or rebound; normal bowel sounds; no organomegaly or masses palpated. Ext:  Normal ROM; without deformities or arthritic changes; no varicose veins, venous insuffic, or edema;  Pulses intact w/o bruits. Neuro:  No focal neuro deficit; gait normal & balance OK. Derm:  No lesions noted; no rash etc. Lymph:  No cervical, supraclavicular, axillary, or inguinal adenopathy palpated.   Assessment:      IMP >>  1)  Mod COPD/emphysema w/ small reversible component-- on Stiolto-2sp per day + AlbutHFA rescue inhaler... 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 Other medical issues>>  Hx GERD, Hx DJD, Hx Bipolar, Hx skin cancers   PLAN >>  04/18/15>  Nereida has mod COPD/emphysema & a small reversible component; she needs to quit all smoking & use the Stiolto regularly, plus her prn rescue inhaler;  She has been found to have nocturnal hypoxemia but no signif OSA at this time- we will proceed w/ her ONO to see if she needs nocturnal oxygen... 07/04/15>  Soleia must quit all smoking & stay quit; rec to continue the Stiolto, use Albut rescue inhaler as needed; use the nocturnal O2 at 2L/min Qhs; get on diet & increase exercise program... She will  f/u in 85mo, sooner if needed. 01/01/16>  Pt wants to change her DME to [facility]- ok w/ me;  We will refill her Stiolto;  Most important intervention is to [place]!!!  We plan ROV in 46mo, sooner if needed prn 07/03/16>  Katrinna is her own worst enemy- she must quit all smoking 7 stay quit, Chantix refilled, stay on Stiolto & Albut rescue, 2017 Flu shot  given today... 12/31/16>  Yalanda is stable w/ her COPD, hypoxemia- unfortunately continue smoking & we discussed this again, Chantix helped in ast 7 asked to restart; Stiolto reflled & we plan continue 17mo rov 06/26/17>  Jacari has quit smoking- congrats;  We will refer to [facility];  rec to continue same meds;  We will continue Q91mo rov's and sooner if needed prn.     Plan:     Patient's Medications  New Prescriptions   No medications on file  Previous Medications   ALBUTEROL SULFATE (PROAIR RESPICLICK) 379 (90 BASE) MCG/ACT AEPB    Inhale 2 puffs into the lungs every 6 (six) hours as needed.   B COMPLEX VITAMINS TABLET    Bid po OTC   CALCIUM CARBONATE (CALCIUM 600) 600 MG TABS TABLET    Take 600 mg by mouth daily with breakfast.   CETIRIZINE (ZYRTEC) 10 MG TABLET    Take 10 mg by mouth daily as needed for allergies.   CHOLECALCIFEROL (HM VITAMIN D3) 4000 UNITS CAPS    Take 1 capsule by mouth daily.   FLUOXETINE (PROZAC) 20 MG CAPSULE    Take 20 mg by mouth.   FLUTICASONE (FLONASE) 50 MCG/ACT NASAL SPRAY    Place 2 sprays into both nostrils daily.   GLUCOSA-CHONDR-NA CHONDR-MSM PO    Take by mouth daily. W/ vit d   LEVOTHYROXINE (SYNTHROID, LEVOTHROID) 75 MCG TABLET    Take 1 tablet (75 mcg total) by mouth daily before breakfast.   OMEPRAZOLE (PRILOSEC) 20 MG CAPSULE    Take 20 mg by mouth every other day.    RANITIDINE (ZANTAC) 150 MG TABLET    Take 150 mg by mouth every other day.   TIOTROPIUM BROMIDE-OLODATEROL (STIOLTO RESPIMAT) 2.5-2.5 MCG/ACT AERS    Inhale 2 puffs into the lungs daily.   TURMERIC PO    Take by mouth.   VALACYCLOVIR  (VALTREX) 1000 MG TABLET    Take 1,000 mg by mouth 2 (two) times daily.  Modified Medications   No medications on file  Discontinued Medications   CHANTIX CONTINUING MONTH PAK 1 MG TABLET    TAKE 1 TABLET (1 MG TOTAL) BY MOUTH 2 (TWO) TIMES DAILY.

## 2017-07-01 ENCOUNTER — Other Ambulatory Visit: Payer: Self-pay | Admitting: *Deleted

## 2017-07-01 DIAGNOSIS — J439 Emphysema, unspecified: Secondary | ICD-10-CM

## 2017-07-03 ENCOUNTER — Ambulatory Visit: Payer: Medicare Other | Admitting: Pulmonary Disease

## 2017-07-14 DIAGNOSIS — Z01419 Encounter for gynecological examination (general) (routine) without abnormal findings: Secondary | ICD-10-CM | POA: Diagnosis not present

## 2017-07-25 DIAGNOSIS — E559 Vitamin D deficiency, unspecified: Secondary | ICD-10-CM | POA: Diagnosis not present

## 2017-08-28 ENCOUNTER — Telehealth (HOSPITAL_COMMUNITY): Payer: Self-pay

## 2017-08-28 NOTE — Telephone Encounter (Signed)
Called and spoke with patient in regards to Pulmonary Rehab - Patient is interested in the program. Scheduled orientation on 09/26/17. Patient will be attending the 10:30am exc class.

## 2017-09-01 DIAGNOSIS — M818 Other osteoporosis without current pathological fracture: Secondary | ICD-10-CM | POA: Diagnosis not present

## 2017-09-01 DIAGNOSIS — Z8781 Personal history of (healed) traumatic fracture: Secondary | ICD-10-CM | POA: Diagnosis not present

## 2017-09-01 DIAGNOSIS — Z79899 Other long term (current) drug therapy: Secondary | ICD-10-CM | POA: Diagnosis not present

## 2017-09-01 DIAGNOSIS — Z87891 Personal history of nicotine dependence: Secondary | ICD-10-CM | POA: Diagnosis not present

## 2017-09-01 DIAGNOSIS — Z1321 Encounter for screening for nutritional disorder: Secondary | ICD-10-CM | POA: Diagnosis not present

## 2017-09-04 ENCOUNTER — Telehealth: Payer: Self-pay | Admitting: Pulmonary Disease

## 2017-09-04 MED ORDER — ALBUTEROL SULFATE 108 (90 BASE) MCG/ACT IN AEPB: 2.0000 | INHALATION_SPRAY | Freq: Four times a day (QID) | RESPIRATORY_TRACT | 0 refills | Status: DC | PRN

## 2017-09-04 NOTE — Telephone Encounter (Signed)
Spoke with the pt and notified rx was sent  Nothing further needed

## 2017-09-22 DIAGNOSIS — Z720 Tobacco use: Secondary | ICD-10-CM | POA: Diagnosis not present

## 2017-09-22 DIAGNOSIS — F3130 Bipolar disorder, current episode depressed, mild or moderate severity, unspecified: Secondary | ICD-10-CM | POA: Diagnosis not present

## 2017-09-22 DIAGNOSIS — M810 Age-related osteoporosis without current pathological fracture: Secondary | ICD-10-CM | POA: Diagnosis not present

## 2017-09-26 ENCOUNTER — Encounter (HOSPITAL_COMMUNITY)
Admission: RE | Admit: 2017-09-26 | Discharge: 2017-09-26 | Disposition: A | Discharge status: Home / Self Care | Payer: Medicare Other | Source: Ambulatory Visit | Attending: Pulmonary Disease | Admitting: Pulmonary Disease

## 2017-09-26 ENCOUNTER — Encounter (HOSPITAL_COMMUNITY): Payer: Self-pay

## 2017-09-26 VITALS — BP 122/74 | HR 73 | Resp 18 | Ht 69.0 in | Wt 200.2 lb

## 2017-09-26 DIAGNOSIS — J439 Emphysema, unspecified: Secondary | ICD-10-CM

## 2017-09-26 NOTE — Progress Notes (Signed)
Lori Day 71 y.o. female Pulmonary Rehab Orientation Note Patient arrived today in [facility]hab for orientation to Pulmonary Rehab. She was transported from [employer] via wheel chair. She does not carry portable oxygen. Per pt, she uses oxygen continuously at night at 2 lpm. Color good, skin warm and dry. Patient is oriented to time and place. Patient's medical history, psychosocial health, and medications reviewed. Psychosocial assessment reveals pt lives alone. Pt is currently retired. Pt hobbies include geneology, dancing, and playing poker. Pt reports her stress level is low except for the stress of smoking. She has had multiple successful attempts at quitting but only able to remain cigarette free for 6 months at the most. She has convinced her family that she has stopped smoking and is distressed that she has to "lie to them".  Pt doe exhibit signs of depression. Signs of depression include crying spells and hypersomnia. PHQ2/9 score 3/6. Pt shows good  coping skills with positive outlook. She is offered emotional support and reassurance. Will continue to monitor and evaluate progress toward psychosocial goal(s) of seeking counseling for her sadness and quilt over continuing to smoke. Physical assessment reveals heart rate is normal, breath sounds clear to auscultation, no wheezes, rales, or rhonchi. Grip strength equal, strong. Distal pulses palpable. Patient reports she does take medications as prescribed. Patient states she follows a Regular diet. The patient reports no specific efforts to gain or lose weight.. Patient's weight will be monitored closely. Demonstration and practice of PLB using pulse oximeter. Patient able to return demonstration satisfactorily. Safety and hand hygiene in the exercise area reviewed with patient. Patient voices understanding of the information reviewed. Department expectations discussed with patient and achievable goals were set. The patient shows  enthusiasm about attending the program and we look forward to working with this nice lady. The patient is scheduled for a 6 min walk test on 09/30/17 and to begin exercise on 10/07/17 at 1030.   45 minutes was spent on a variety of activities such as assessment of the patient, obtaining baseline data including height, weight, BMI, and grip strength, verifying medical history, allergies, and current medications, and teaching patient strategies for performing tasks with less respiratory effort with emphasis on pursed lip breathing.

## 2017-09-30 ENCOUNTER — Encounter (HOSPITAL_COMMUNITY)
Admission: RE | Admit: 2017-09-30 | Discharge: 2017-09-30 | Disposition: A | Discharge status: Home / Self Care | Payer: Medicare Other | Source: Ambulatory Visit | Attending: Pulmonary Disease | Admitting: Pulmonary Disease

## 2017-09-30 DIAGNOSIS — J439 Emphysema, unspecified: Secondary | ICD-10-CM | POA: Diagnosis not present

## 2017-10-02 ENCOUNTER — Encounter (HOSPITAL_COMMUNITY): Payer: Self-pay | Admitting: *Deleted

## 2017-10-02 NOTE — Progress Notes (Signed)
Pulmonary Individual Treatment Plan  Patient Details  Name: Lori Day MRN: [id number] Date of Birth: 1945/12/14 Referring Provider:     Pulmonary Rehab Walk Test from 09/30/2017 in [place]  Referring Provider  Dr. Lenna Gilford      Initial Encounter Date:    Pulmonary Rehab Walk Test from 09/30/2017 in [facility]  Date  10/02/17  Referring Provider  Dr. Lenna Gilford      Visit Diagnosis: Pulmonary emphysema, unspecified emphysema type ([place])  Patient's Home Medications on Admission:   Current Outpatient Medications:  .  Albuterol Sulfate (PROAIR RESPICLICK) 099 (90 Base) MCG/ACT AEPB, Inhale 2 puffs every 6 (six) hours as needed into the lungs., Disp: 1 each, Rfl: 0 .  b complex vitamins tablet, Bid po OTC, Disp: , Rfl:  .  buPROPion (WELLBUTRIN XL) 150 MG 24 hr tablet, TAKE 1 TABLET BY MOUTH EVERY DAY IN THE MORNING, Disp: , Rfl: 1 .  calcium carbonate (CALCIUM 600) 600 MG TABS tablet, Take 600 mg by mouth daily with breakfast., Disp: , Rfl:  .  Calcium-Magnesium-Vitamin D (CALCIUM 1200+D3 PO), Take by mouth., Disp: , Rfl:  .  cetirizine (ZYRTEC) 10 MG tablet, Take 10 mg by mouth daily as needed for allergies., Disp: , Rfl:  .  Cholecalciferol (HM VITAMIN D3) 4000 units CAPS, Take 1 capsule by mouth daily., Disp: , Rfl:  .  FLUoxetine (PROZAC) 20 MG capsule, Take 20 mg by mouth., Disp: , Rfl:  .  fluticasone (FLONASE) 50 MCG/ACT nasal spray, Place 2 sprays into both nostrils daily., Disp: , Rfl:  .  GLUCOSA-CHONDR-NA CHONDR-MSM PO, Take by mouth daily. W/ vit d, Disp: , Rfl:  .  levothyroxine (SYNTHROID, LEVOTHROID) 75 MCG tablet, Take 1 tablet (75 mcg total) by mouth daily before breakfast., Disp: 30 tablet, Rfl: 6 .  omeprazole (PRILOSEC) 20 MG capsule, Take 20 mg by mouth every other day. , Disp: , Rfl:  .  ranitidine (ZANTAC) 150 MG tablet, Take 150 mg by mouth every other day., Disp: , Rfl:  .  Tiotropium  Bromide-Olodaterol (STIOLTO RESPIMAT) 2.5-2.5 MCG/ACT AERS, Inhale 2 puffs into the lungs daily., Disp: 3 Inhaler, Rfl: 4 .  TURMERIC PO, Take by mouth., Disp: , Rfl:  .  valACYclovir (VALTREX) 1000 MG tablet, Take 1,000 mg by mouth 2 (two) times daily., Disp: , Rfl:   Past Medical History: Past Medical History:  Diagnosis Date  . Arthritis   . Bipolar 1 disorder ([place])   . 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 She quit in 2013.   Marland Kitchen GERD (gastroesophageal reflux disease)   . Glaucoma   . Hiatal hernia   . History of HPV infection   . Hx of chronic sinusitis   . 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 Sciatica   . Sensory disturbance 09/19/2014  . Sensory hearing loss, bilateral   . Sjogren's syndrome ([place]) 09-02-2007  . 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 Thyroid nodule   . Tongue cancer ([place]) 02-02-2013   HPV/ radiation/chemo    Tobacco Use: Social History   Tobacco Use  Smoking Status Current Every Day Smoker  . Packs/day: 1.00  . Years: 50.00  . Pack years: 50.00  . Types: Cigarettes  . Last attempt to quit: 02/25/2017  . Years since quitting: 0.6  Smokeless Tobacco Never Used    Labs: Recent Review Scientist, physiological  There is no flowsheet data to display.      Capillary Blood Glucose: No results found for: GLUCAP   Pulmonary Assessment Scores: Pulmonary Assessment Scores    Row Name 10/02/17 0714         ADL UCSD   ADL Phase  Entry       mMRC Score   mMRC Score  1        Pulmonary Function Assessment: Pulmonary Function Assessment - 09/26/17 1118      Breath   Bilateral Breath Sounds  Clear    Shortness of Breath  Limiting activity uphill walking       Exercise Target Goals: Date: 10/02/17  Exercise Program Goal: Individual exercise prescription set with THRR, safety & activity barriers. Participant demonstrates ability to understand and report RPE using BORG scale, to  self-measure pulse accurately, and to acknowledge the importance of the exercise prescription.  Exercise Prescription Goal: Starting with aerobic activity 30 plus minutes a day, 3 days per week for initial exercise prescription. Provide home exercise prescription and guidelines that participant acknowledges understanding prior to discharge.  Activity Barriers & Risk Stratification: Activity Barriers & Cardiac Risk Stratification - 09/26/17 1050      Activity Barriers & Cardiac Risk Stratification   Activity Barriers  Shortness of Breath tendonitis right wrist       6 Minute Walk:   Oxygen Initial Assessment: Oxygen Initial Assessment - 10/02/17 0713      Initial 6 min Walk   Oxygen Used  None      Program Oxygen Prescription   Program Oxygen Prescription  Continuous    Liters per minute  2    Comments  Patient desaturated to 85% on her 6MWT. Will monitor during first day to see how she does but its predicted we will need to use atleast 2 liters       Oxygen Re-Evaluation:   Oxygen Discharge (Final Oxygen Re-Evaluation):   Initial Exercise Prescription: Initial Exercise Prescription - 10/02/17 0700      Date of Initial Exercise RX and Referring Provider   Date  10/02/17    Referring Provider  Dr. Lenna Gilford      Oxygen   Oxygen  Continuous    Liters  2      Bike   Level  0.4    Minutes  17      NuStep   Level  2    SPM  80    Minutes  17    METs  1.4      Track   Laps  10    Minutes  17      Prescription Details   Frequency (times per week)  2    Duration  Progress to 45 minutes of aerobic exercise without signs/symptoms of physical distress      Intensity   THRR 40-80% of Max Heartrate  60-120    Ratings of Perceived Exertion  11-13    Perceived Dyspnea  0-4      Progression   Progression  Continue progressive overload as per policy without signs/symptoms or physical distress.      Resistance Training   Training Prescription  Yes    Weight  blue  bands    Reps  10-15       Perform Capillary Blood Glucose checks as needed.  Exercise Prescription Changes:   Exercise Comments:   Exercise Goals and Review: Exercise Goals    Row Name 09/26/17 1051  Exercise Goals   Increase Physical Activity  Yes       Intervention  Provide advice, education, support and counseling about physical activity/exercise needs.;Develop an individualized exercise prescription for aerobic and resistive training based on initial evaluation findings, risk stratification, comorbidities and participant's personal goals.       Expected Outcomes  Achievement of increased cardiorespiratory fitness and enhanced flexibility, muscular endurance and strength shown through measurements of functional capacity and personal statement of participant.       Increase Strength and Stamina  Yes       Intervention  Provide advice, education, support and counseling about physical activity/exercise needs.;Develop an individualized exercise prescription for aerobic and resistive training based on initial evaluation findings, risk stratification, comorbidities and participant's personal goals.       Expected Outcomes  Achievement of increased cardiorespiratory fitness and enhanced flexibility, muscular endurance and strength shown through measurements of functional capacity and personal statement of participant.       Able to understand and use rate of perceived exertion (RPE) scale  Yes       Intervention  Provide education and explanation on how to use RPE scale       Expected Outcomes  Short Term: Able to use RPE daily in rehab to express subjective intensity level;Long Term:  Able to use RPE to guide intensity level when exercising independently       Able to understand and use Dyspnea scale  Yes       Intervention  Provide education and explanation on how to use Dyspnea scale       Expected Outcomes  Short Term: Able to use Dyspnea scale daily in rehab to express  subjective sense of shortness of breath during exertion;Long Term: Able to use Dyspnea scale to guide intensity level when exercising independently       Knowledge and understanding of Target Heart Rate Range (THRR)  Yes       Intervention  Provide education and explanation of THRR including how the numbers were predicted and where they are located for reference       Expected Outcomes  Short Term: Able to state/look up THRR       Understanding of Exercise Prescription  Yes       Intervention  Provide education, explanation, and written materials on patient's individual exercise prescription       Expected Outcomes  Short Term: Able to explain program exercise prescription;Long Term: Able to explain home exercise prescription to exercise independently          Exercise Goals Re-Evaluation :   Discharge Exercise Prescription (Final Exercise Prescription Changes):   Nutrition:  Target Goals: Understanding of nutrition guidelines, daily intake of sodium 1500mg , cholesterol 200mg , calories 30% from fat and 7% or less from saturated fats, daily to have 5 or more servings of fruits and vegetables.  Biometrics: Pre Biometrics - 09/26/17 1053      Pre Biometrics   Grip Strength  32 kg        Nutrition Therapy Plan and Nutrition Goals:   Nutrition Discharge: Rate Your Plate Scores:   Nutrition Goals Re-Evaluation:   Nutrition Goals Discharge (Final Nutrition Goals Re-Evaluation):   Psychosocial: Target Goals: Acknowledge presence or absence of significant depression and/or stress, maximize coping skills, provide positive support system. Participant is able to verbalize types and ability to use techniques and skills needed for reducing stress and depression.  Initial Review & Psychosocial Screening: Initial Psych Review & Screening - 09/26/17  1121      Initial Review   Current issues with  Current Depression;Current Stress Concerns only related to current smoking    Comments   related to smoking and unability to remain cigarette free      Provencal?  Yes    Concerns  No support system    Comments  as it related to smoking. she cannot tell her family that she still smokes and it bothers her to the point she smokes and crys at the same time.      Barriers   Psychosocial barriers to participate in program  The patient should benefit from training in stress management and relaxation.      Screening Interventions   Interventions  Encouraged to exercise;To provide support and resources with identified psychosocial needs       Quality of Life Scores:   PHQ-9: Recent Review Flowsheet Data    Depression screen [facility] 2/9 09/26/2017 09/17/2016   Decreased Interest 0 0   Down, Depressed, Hopeless 3  0   PHQ - 2 Score 3 0   Altered sleeping 0 -   Tired, decreased energy 0 -   Feeling bad or failure about yourself  3  -   Trouble concentrating 0 -   Moving slowly or fidgety/restless 0 -   Suicidal thoughts 0 -   PHQ-9 Score 6 -   Difficult doing work/chores Not difficult at all -     Interpretation of Total Score  Total Score Depression Severity:  1-4 = Minimal depression, 5-9 = Mild depression, 10-14 = Moderate depression, 15-19 = Moderately severe depression, 20-27 = Severe depression   Psychosocial Evaluation and Intervention: Psychosocial Evaluation - 09/26/17 1127      Psychosocial Evaluation & Interventions   Interventions  Encouraged to exercise with the program and follow exercise prescription    Continue Psychosocial Services   Follow up required by staff       Psychosocial Re-Evaluation:   Psychosocial Discharge (Final Psychosocial Re-Evaluation):   Education: Education Goals: Education classes will be provided on a weekly basis, covering required topics. Participant will state understanding/return demonstration of topics presented.  Learning Barriers/Preferences: Learning Barriers/Preferences - 09/26/17 1118        Learning Barriers/Preferences   Learning Barriers  None    Learning Preferences  Computer/Internet;Individual Instruction;Verbal Instruction;Written Material;Video;Skilled Demonstration       Education Topics: Risk Factor Reduction:  -Group instruction that is supported by a PowerPoint presentation. Instructor discusses the definition of a risk factor, different risk factors for pulmonary disease, and how the heart and lungs work together.     Nutrition for Pulmonary Patient:  -Group instruction provided by PowerPoint slides, verbal discussion, and written materials to support subject matter. The instructor gives an explanation and review of healthy diet recommendations, which includes a discussion on weight management, recommendations for fruit and vegetable consumption, as well as protein, fluid, caffeine, fiber, sodium, sugar, and alcohol. Tips for eating when patients are short of breath are discussed.   Pursed Lip Breathing:  -Group instruction that is supported by demonstration and informational handouts. Instructor discusses the benefits of pursed lip and diaphragmatic breathing and detailed demonstration on how to preform both.     Oxygen Safety:  -Group instruction provided by PowerPoint, verbal discussion, and written material to support subject matter. There is an overview of "What is Oxygen" and "Why do we need it".  Instructor also reviews how to create a safe environment for  oxygen use, the importance of using oxygen as prescribed, and the risks of noncompliance. There is a brief discussion on traveling with oxygen and resources the patient may utilize.   Oxygen Equipment:  -Group instruction provided by [facility] Staff utilizing handouts, written materials, and equipment demonstrations.   Signs and Symptoms:  -Group instruction provided by written material and verbal discussion to support subject matter. Warning signs and symptoms of infection, stroke, and heart  attack are reviewed and when to call the physician/911 reinforced. Tips for preventing the spread of infection discussed.   Advanced Directives:  -Group instruction provided by verbal instruction and written material to support subject matter. Instructor reviews Advanced Directive laws and proper instruction for filling out document.   Pulmonary Video:  -Group video education that reviews the importance of medication and oxygen compliance, exercise, good nutrition, pulmonary hygiene, and pursed lip and diaphragmatic breathing for the pulmonary patient.   Exercise for the Pulmonary Patient:  -Group instruction that is supported by a PowerPoint presentation. Instructor discusses benefits of exercise, core components of exercise, frequency, duration, and intensity of an exercise routine, importance of utilizing pulse oximetry during exercise, safety while exercising, and options of places to exercise outside of rehab.     Pulmonary Medications:  -Verbally interactive group education provided by instructor with focus on inhaled medications and proper administration.   Anatomy and Physiology of the Respiratory System and Intimacy:  -Group instruction provided by PowerPoint, verbal discussion, and written material to support subject matter. Instructor reviews respiratory cycle and anatomical components of the respiratory system and their functions. Instructor also reviews differences in obstructive and restrictive respiratory diseases with examples of each. Intimacy, Sex, and Sexuality differences are reviewed with a discussion on how relationships can change when diagnosed with pulmonary disease. Common sexual concerns are reviewed.   MD DAY -A group question and answer session with a medical doctor that allows participants to ask questions that relate to their pulmonary disease state.   OTHER EDUCATION -Group or individual verbal, written, or video instructions that support the educational  goals of the pulmonary rehab program.   Knowledge Questionnaire Score: Knowledge Questionnaire Score - 09/26/17 1118      Knowledge Questionnaire Score   Pre Score  12/13       Core Components/Risk Factors/Patient Goals at Admission: Personal Goals and Risk Factors at Admission - 09/26/17 1119      Core Components/Risk Factors/Patient Goals on Admission   Tobacco Cessation  Yes    Number of packs per day  1 pack per 2 days. given "fake" cigarette, quit smart handout, started welbutrin Monday, awaiting for therapist appointment from primary MD.  she has successfully quit smoking multiple times and remained cigarette free for up to 6 months at a time.    Intervention  Assist the participant in steps to quit. Provide individualized education and counseling about committing to Tobacco Cessation, relapse prevention, and pharmacological support that can be provided by physician.;Advice worker, assist with locating and accessing local/national Quit Smoking programs, and support quit date choice.    Expected Outcomes  Short Term: Will demonstrate readiness to quit, by selecting a quit date.;Short Term: Will quit all tobacco product use, adhering to prevention of relapse plan.;Long Term: Complete abstinence from all tobacco products for at least 12 months from quit date.    Improve shortness of breath with ADL's  Yes    Intervention  Provide education, individualized exercise plan and daily activity instruction to help decrease symptoms of  SOB with activities of daily living.    Expected Outcomes  Short Term: Achieves a reduction of symptoms when performing activities of daily living.    Develop more efficient breathing techniques such as purse lipped breathing and diaphragmatic breathing; and practicing self-pacing with activity  Yes    Intervention  Provide education, demonstration and support about specific breathing techniuqes utilized for more efficient breathing. Include  techniques such as pursed lipped breathing, diaphragmatic breathing and self-pacing activity.    Expected Outcomes  Short Term: Participant will be able to demonstrate and use breathing techniques as needed throughout daily activities.    Increase knowledge of respiratory medications and ability to use respiratory devices properly   Yes    Intervention  Provide education and demonstration as needed of appropriate use of medications, inhalers, and oxygen therapy.    Expected Outcomes  Short Term: Achieves understanding of medications use. Understands that oxygen is a medication prescribed by physician. Demonstrates appropriate use of inhaler and oxygen therapy.       Core Components/Risk Factors/Patient Goals Review:    Core Components/Risk Factors/Patient Goals at Discharge (Final Review):    ITP Comments:   Comments:

## 2017-10-07 ENCOUNTER — Encounter (HOSPITAL_COMMUNITY): Payer: Medicare Other

## 2017-10-09 ENCOUNTER — Encounter (HOSPITAL_COMMUNITY)
Admission: RE | Admit: 2017-10-09 | Discharge: 2017-10-09 | Disposition: A | Discharge status: Home / Self Care | Payer: Medicare Other | Source: Ambulatory Visit | Attending: Pulmonary Disease | Admitting: Pulmonary Disease

## 2017-10-09 VITALS — Wt 200.0 lb

## 2017-10-09 DIAGNOSIS — J439 Emphysema, unspecified: Secondary | ICD-10-CM

## 2017-10-09 NOTE — Progress Notes (Signed)
Daily Session Note  Patient Details  Name: Lori Day MRN: [id number] Date of Birth: 11-06-1945 Referring Provider:     Pulmonary Rehab Walk Test from 09/30/2017 in [place]  Referring Provider  Dr. Lenna Gilford      Encounter Date: 10/09/2017  Check In: Session Check In - 10/09/17 1214      Check-In   Location  [facility]    Staff Present  Rosebud Poles, RN, Luisa Hart, RN, BSN;Molly diVincenzo, MS, ACSM RCEP, Exercise Physiologist    Supervising physician immediately available to respond to emergencies  [facility] immediately available    Physician(s)  Dr. Starla Link    Medication changes reported      No    Fall or balance concerns reported     No    Tobacco Cessation  No Change    Warm-up and Cool-down  Performed as group-led instruction    Resistance Training Performed  Yes    VAD Patient?  No      Pain Assessment   Currently in Pain?  No/denies    Multiple Pain Sites  No       Capillary Blood Glucose: No results found for this or any previous visit (from the past 24 hour(s)).  Exercise Prescription Changes - 10/09/17 1200      Response to Exercise   Blood Pressure (Admit)  104/62    Blood Pressure (Exercise)  150/70    Blood Pressure (Exit)  122/62    Heart Rate (Admit)  70 bpm    Heart Rate (Exercise)  99 bpm    Heart Rate (Exit)  81 bpm    Oxygen Saturation (Admit)  92 %    Oxygen Saturation (Exercise)  89 %    Oxygen Saturation (Exit)  91 %    Rating of Perceived Exertion (Exercise)  11    Perceived Dyspnea (Exercise)  1    Duration  Progress to 45 minutes of aerobic exercise without signs/symptoms of physical distress    Intensity  Other (comment) 40-80% HRR      Progression   Progression  Continue to progress workloads to maintain intensity without signs/symptoms of physical distress.      Resistance Training   Training Prescription  Yes    Weight  blue bands    Reps  10-15      Bike   Level  -- carpel tunnel was bothered by AD bike, switched to recumbent      Recumbant Bike   Level  1    Minutes  17      NuStep   Level  2    Minutes  17    METs  2      Track   Laps  14    Minutes  17       Social History   Tobacco Use  Smoking Status Current Every Day Smoker  . Packs/day: 1.00  . Years: 50.00  . Pack years: 50.00  . Types: Cigarettes  . Last attempt to quit: 02/25/2017  . Years since quitting: 0.6  Smokeless Tobacco Never Used    Goals Met:  Exercise tolerated well Strength training completed today  Goals Unmet:  Not Applicable  Comments: Service time is from 1030 to 1205    Dr. Rush Farmer is Medical Director for Pulmonary Rehab at [facility].

## 2017-10-10 NOTE — Progress Notes (Signed)
Lori Day 71 y.o. female   DOB: [birth year] MRN: [id number]          Nutrition 1. Pulmonary emphysema, unspecified emphysema type ([place])    Past Medical History:  Diagnosis Date  . Arthritis   . Bipolar 1 disorder ([place])   . Cancer of tongue ([place]) 09/19/2014   HPV infection, and ex smoker. She quit in 2013.   Marland Kitchen GERD (gastroesophageal reflux disease)   . Glaucoma   . Hiatal hernia   . History of HPV infection   . Hx of chronic sinusitis   . 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 Sciatica   . Sensory disturbance 09/19/2014  . Sensory hearing loss, bilateral   . Sjogren's syndrome ([place]) 09-02-2007  . 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 Thyroid nodule   . Tongue cancer ([place]) 02-02-2013   HPV/ radiation/chemo   Meds reviewed.   Ht: Ht Readings from Last 1 Encounters:  09/26/17 5\' 9"  (1.753 m)    Wt:  Wt Readings from Last 3 Encounters:  10/09/17 199 lb 15.3 oz (90.7 kg)  09/26/17 200 lb 2.8 oz (90.8 kg)  06/26/17 198 lb 4 oz (89.9 kg)   BMI: 29.7    Current tobacco use? No  Labs:  Lipid Panel  No results found for: CHOL, TRIG, HDL, CHOLHDL, VLDL, LDLCALC, LDLDIRECT  No results found for: HGBA1C  Nutrition Diagnosis ? Food-and nutrition-related knowledge deficit related to lack of exposure to information as related to diagnosis of pulmonary disease ? Overweight related to excessive energy intake as evidenced by a BMI of 29.7  Goal(s) 1. The pt will recognize symptoms that can interfere with adequate oral intake, such as shortness of breath, N/V, early satiety, fatigue, ability to secure and prepare food, taste and smell changes, chewing/swallowing difficulties, and/ or pain when eating. 2. Identify food quantities necessary to achieve wt loss of  -2# per week to a goal wt loss of 6-20 lb at graduation from pulmonary rehab.  Plan:  Pt to attend Pulmonary Nutrition class Will provide client-centered nutrition education as  part of interdisciplinary care.   Monitor and evaluate progress toward nutrition goal with team.  Monitor and Evaluate progress toward nutrition goal with team.   Derek Mound, M.Ed, RD, LDN, CDE 10/10/2017 2:16 PM

## 2017-10-13 DIAGNOSIS — E7800 Pure hypercholesterolemia, unspecified: Secondary | ICD-10-CM | POA: Diagnosis not present

## 2017-10-13 DIAGNOSIS — K219 Gastro-esophageal reflux disease without esophagitis: Secondary | ICD-10-CM | POA: Diagnosis not present

## 2017-10-13 DIAGNOSIS — F3130 Bipolar disorder, current episode depressed, mild or moderate severity, unspecified: Secondary | ICD-10-CM | POA: Diagnosis not present

## 2017-10-13 DIAGNOSIS — M810 Age-related osteoporosis without current pathological fracture: Secondary | ICD-10-CM | POA: Diagnosis not present

## 2017-10-13 DIAGNOSIS — Z720 Tobacco use: Secondary | ICD-10-CM | POA: Diagnosis not present

## 2017-10-14 ENCOUNTER — Encounter (HOSPITAL_COMMUNITY)
Admission: RE | Admit: 2017-10-14 | Discharge: 2017-10-14 | Disposition: A | Discharge status: Home / Self Care | Payer: Medicare Other | Source: Ambulatory Visit | Attending: Pulmonary Disease | Admitting: Pulmonary Disease

## 2017-10-14 VITALS — Wt 198.0 lb

## 2017-10-14 DIAGNOSIS — J439 Emphysema, unspecified: Secondary | ICD-10-CM

## 2017-10-14 NOTE — Progress Notes (Signed)
Daily Session Note  Patient Details  Name: Lori Day MRN: [id number] Date of Birth: 1946/02/08 Referring Provider:     Pulmonary Rehab Walk Test from 09/30/2017 in [place]  Referring Provider  Dr. Lenna Gilford      Encounter Date: 10/14/2017  Check In: Session Check In - 10/14/17 1517      Check-In   Staff Present  Rosebud Poles, RN, BSN;Molly diVincenzo, MS, ACSM RCEP, Exercise Physiologist;Markeita Alicia Ysidro Evert, RN;Portia Rollene Rotunda, RN, BSN    Supervising physician immediately available to respond to emergencies  [facility] immediately available    Physician(s)  Dr. Eliseo Squires    Medication changes reported      No    Fall or balance concerns reported     No    Tobacco Cessation  No Change    Warm-up and Cool-down  Performed as group-led instruction    Resistance Training Performed  Yes    VAD Patient?  No      Pain Assessment   Currently in Pain?  No/denies    Multiple Pain Sites  No       Capillary Blood Glucose: No results found for this or any previous visit (from the past 24 hour(s)).  Exercise Prescription Changes - 10/14/17 1500      Response to Exercise   Blood Pressure (Admit)  122/66    Blood Pressure (Exercise)  142/56    Blood Pressure (Exit)  110/70    Heart Rate (Admit)  70 bpm    Heart Rate (Exercise)  103 bpm    Heart Rate (Exit)  76 bpm    Oxygen Saturation (Admit)  93 %    Oxygen Saturation (Exercise)  89 %    Oxygen Saturation (Exit)  95 %    Rating of Perceived Exertion (Exercise)  12    Perceived Dyspnea (Exercise)  0    Duration  Progress to 45 minutes of aerobic exercise without signs/symptoms of physical distress    Intensity  THRR unchanged      Progression   Progression  Continue to progress workloads to maintain intensity without signs/symptoms of physical distress.      Resistance Training   Training Prescription  Yes    Weight  blue bands    Reps  10-15    Time  10 Minutes      Recumbant Bike   Level  1     Minutes  17      NuStep   Level  2    Minutes  17    METs  2.1      Track   Laps  19    Minutes  17       Social History   Tobacco Use  Smoking Status Current Every Day Smoker  . Packs/day: 1.00  . Years: 50.00  . Pack years: 50.00  . Types: Cigarettes  . Last attempt to quit: 02/25/2017  . Years since quitting: 0.6  Smokeless Tobacco Never Used    Goals Met:  Exercise tolerated well No report of cardiac concerns or symptoms Strength training completed today  Goals Unmet:  Not Applicable  Comments: Service time is from 1030 to 1215    Dr. Rush Farmer is Medical Director for Pulmonary Rehab at [facility].

## 2017-10-16 ENCOUNTER — Encounter (HOSPITAL_COMMUNITY)
Admission: RE | Admit: 2017-10-16 | Discharge: 2017-10-16 | Disposition: A | Discharge status: Home / Self Care | Payer: Medicare Other | Source: Ambulatory Visit | Attending: Pulmonary Disease | Admitting: Pulmonary Disease

## 2017-10-16 VITALS — Wt 198.9 lb

## 2017-10-16 DIAGNOSIS — J439 Emphysema, unspecified: Secondary | ICD-10-CM

## 2017-10-16 NOTE — Progress Notes (Signed)
Daily Session Note  Patient Details  Name: Lori Day MRN: [id number] Date of Birth: Apr 24, 1946 Referring Provider:     Pulmonary Rehab Walk Test from 09/30/2017 in [place]  Referring Provider  Dr. Lenna Gilford      Encounter Date: 10/16/2017  Check In: Session Check In - 10/16/17 1122      Check-In   Location  [facility]    Staff Present  Trish Fountain, RN, BSN;Lisa Ysidro Evert, RN;Arda Daggs, MS, ACSM RCEP, Exercise Physiologist    Supervising physician immediately available to respond to emergencies  [facility] immediately available    Physician(s)  Dr. Wendee Beavers    Medication changes reported      No    Fall or balance concerns reported     No    Tobacco Cessation  No Change    Warm-up and Cool-down  Performed as group-led instruction    Resistance Training Performed  Yes    VAD Patient?  No      Pain Assessment   Currently in Pain?  No/denies    Multiple Pain Sites  No       Capillary Blood Glucose: No results found for this or any previous visit (from the past 24 hour(s)).  Exercise Prescription Changes - 10/16/17 1300      Response to Exercise   Blood Pressure (Admit)  110/60    Blood Pressure (Exercise)  134/60    Blood Pressure (Exit)  108/60    Heart Rate (Admit)  73 bpm    Heart Rate (Exercise)  108 bpm    Heart Rate (Exit)  84 bpm    Oxygen Saturation (Admit)  90 %    Oxygen Saturation (Exercise)  89 %    Oxygen Saturation (Exit)  91 %    Rating of Perceived Exertion (Exercise)  12    Perceived Dyspnea (Exercise)  1    Duration  Progress to 45 minutes of aerobic exercise without signs/symptoms of physical distress    Intensity  THRR unchanged      Progression   Progression  Continue to progress workloads to maintain intensity without signs/symptoms of physical distress.      Resistance Training   Training Prescription  Yes    Weight  blue bands    Reps  10-15    Time  10 Minutes      Recumbant Bike   Level  2    Minutes  17      Track   Laps  19    Minutes  17       Social History   Tobacco Use  Smoking Status Current Every Day Smoker  . Packs/day: 1.00  . Years: 50.00  . Pack years: 50.00  . Types: Cigarettes  . Last attempt to quit: 02/25/2017  . Years since quitting: 0.6  Smokeless Tobacco Never Used    Goals Met:  Exercise tolerated well No report of cardiac concerns or symptoms Strength training completed today  Goals Unmet:  Not Applicable  Comments: Service time is from 10:30a to 12:45p    Dr. Rush Farmer is Medical Director for Pulmonary Rehab at [facility].

## 2017-10-23 ENCOUNTER — Telehealth (HOSPITAL_COMMUNITY): Payer: Self-pay | Admitting: Family Medicine

## 2017-10-23 ENCOUNTER — Encounter (HOSPITAL_COMMUNITY)
Admission: RE | Admit: 2017-10-23 | Discharge: 2017-10-23 | Disposition: A | Discharge status: Home / Self Care | Payer: Medicare Other | Source: Ambulatory Visit | Attending: Pulmonary Disease | Admitting: Pulmonary Disease

## 2017-10-23 DIAGNOSIS — J439 Emphysema, unspecified: Secondary | ICD-10-CM

## 2017-10-23 NOTE — Progress Notes (Signed)
Pulmonary Individual Treatment Plan  Patient Details  Name: Lori Day MRN: [id number] Date of Birth: 1945/12/14 Referring Provider:     Pulmonary Rehab Walk Test from 09/30/2017 in [place]  Referring Provider  Dr. Lenna Gilford      Initial Encounter Date:    Pulmonary Rehab Walk Test from 09/30/2017 in [facility]  Date  10/02/17  Referring Provider  Dr. Lenna Gilford      Visit Diagnosis: Pulmonary emphysema, unspecified emphysema type ([place])  Patient's Home Medications on Admission:   Current Outpatient Medications:  .  Albuterol Sulfate (PROAIR RESPICLICK) 099 (90 Base) MCG/ACT AEPB, Inhale 2 puffs every 6 (six) hours as needed into the lungs., Disp: 1 each, Rfl: 0 .  b complex vitamins tablet, Bid po OTC, Disp: , Rfl:  .  buPROPion (WELLBUTRIN XL) 150 MG 24 hr tablet, TAKE 1 TABLET BY MOUTH EVERY DAY IN THE MORNING, Disp: , Rfl: 1 .  calcium carbonate (CALCIUM 600) 600 MG TABS tablet, Take 600 mg by mouth daily with breakfast., Disp: , Rfl:  .  Calcium-Magnesium-Vitamin D (CALCIUM 1200+D3 PO), Take by mouth., Disp: , Rfl:  .  cetirizine (ZYRTEC) 10 MG tablet, Take 10 mg by mouth daily as needed for allergies., Disp: , Rfl:  .  Cholecalciferol (HM VITAMIN D3) 4000 units CAPS, Take 1 capsule by mouth daily., Disp: , Rfl:  .  FLUoxetine (PROZAC) 20 MG capsule, Take 20 mg by mouth., Disp: , Rfl:  .  fluticasone (FLONASE) 50 MCG/ACT nasal spray, Place 2 sprays into both nostrils daily., Disp: , Rfl:  .  GLUCOSA-CHONDR-NA CHONDR-MSM PO, Take by mouth daily. W/ vit d, Disp: , Rfl:  .  levothyroxine (SYNTHROID, LEVOTHROID) 75 MCG tablet, Take 1 tablet (75 mcg total) by mouth daily before breakfast., Disp: 30 tablet, Rfl: 6 .  omeprazole (PRILOSEC) 20 MG capsule, Take 20 mg by mouth every other day. , Disp: , Rfl:  .  ranitidine (ZANTAC) 150 MG tablet, Take 150 mg by mouth every other day., Disp: , Rfl:  .  Tiotropium  Bromide-Olodaterol (STIOLTO RESPIMAT) 2.5-2.5 MCG/ACT AERS, Inhale 2 puffs into the lungs daily., Disp: 3 Inhaler, Rfl: 4 .  TURMERIC PO, Take by mouth., Disp: , Rfl:  .  valACYclovir (VALTREX) 1000 MG tablet, Take 1,000 mg by mouth 2 (two) times daily., Disp: , Rfl:   Past Medical History: Past Medical History:  Diagnosis Date  . Arthritis   . Bipolar 1 disorder ([place])   . 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 She quit in 2013.   [place] Kitchen GERD (gastroesophageal reflux disease)   . Glaucoma   . Hiatal hernia   . History of HPV infection   . Hx of chronic sinusitis   . 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 Sciatica   . Sensory disturbance 09/19/2014  . Sensory hearing loss, bilateral   . Sjogren's syndrome ([place]) 09-02-2007  . 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 Thyroid nodule   . Tongue cancer ([place]) 02-02-2013   HPV/ radiation/chemo    Tobacco Use: Social History   Tobacco Use  Smoking Status Current Every Day Smoker  . Packs/day: 1.00  . Years: 50.00  . Pack years: 50.00  . Types: Cigarettes  . Last attempt to quit: 02/25/2017  . Years since quitting: 0.6  Smokeless Tobacco Never Used    Labs: Recent Review Scientist, physiological  There is no flowsheet data to display.      Capillary Blood Glucose: No results found for: GLUCAP   Pulmonary Assessment Scores: Pulmonary Assessment Scores    Row Name 10/02/17 0714 10/02/17 0916       ADL UCSD   ADL Phase  Entry  Entry    SOB Score total  -  22      CAT Score   CAT Score  -  9 Entry      mMRC Score   mMRC Score  1  -       Pulmonary Function Assessment: Pulmonary Function Assessment - 09/26/17 1118      Breath   Bilateral Breath Sounds  Clear    Shortness of Breath  Limiting activity uphill walking       Exercise Target Goals:    Exercise Program Goal: Individual exercise prescription set with THRR, safety & activity barriers. Participant  demonstrates ability to understand and report RPE using BORG scale, to self-measure pulse accurately, and to acknowledge the importance of the exercise prescription.  Exercise Prescription Goal: Starting with aerobic activity 30 plus minutes a day, 3 days per week for initial exercise prescription. Provide home exercise prescription and guidelines that participant acknowledges understanding prior to discharge.  Activity Barriers & Risk Stratification: Activity Barriers & Cardiac Risk Stratification - 09/26/17 1050      Activity Barriers & Cardiac Risk Stratification   Activity Barriers  Shortness of Breath tendonitis right wrist       6 Minute Walk:   Oxygen Initial Assessment: Oxygen Initial Assessment - 10/02/17 0713      Initial 6 min Walk   Oxygen Used  None      Program Oxygen Prescription   Program Oxygen Prescription  Continuous    Liters per minute  2    Comments  Patient desaturated to 85% on her 6MWT. Will monitor during first day to see how she does but its predicted we will need to use atleast 2 liters       Oxygen Re-Evaluation: Oxygen Re-Evaluation    Row Name 10/23/17 0717             Program Oxygen Prescription   Program Oxygen Prescription  Continuous       Liters per minute  2       Comments  Patient desaturated to 85% on her 6MWT. Will monitor during first day to see how she does but its predicted we will need to use atleast 2 liters         Home Oxygen   Home Oxygen Device  Home Concentrator       Sleep Oxygen Prescription  Continuous       Liters per minute  2       Home Exercise Oxygen Prescription  None       Home at Rest Exercise Oxygen Prescription  None       Compliance with Home Oxygen Use  Yes         Goals/Expected Outcomes   Short Term Goals  To learn and exhibit compliance with exercise, home and travel O2 prescription;To learn and understand importance of monitoring SPO2 with pulse oximeter and demonstrate accurate use of the pulse  oximeter.;To learn and understand importance of maintaining oxygen saturations>88%;To learn and demonstrate proper pursed lip breathing techniques or other breathing techniques.;To learn and demonstrate proper use of respiratory medications       Long  Term Goals  Exhibits compliance with exercise,  home and travel O2 prescription;Verbalizes importance of monitoring SPO2 with pulse oximeter and return demonstration;Maintenance of O2 saturations>88%;Exhibits proper breathing techniques, such as pursed lip breathing or other method taught during program session;Compliance with respiratory medication;Demonstrates proper use of MDI's          Oxygen Discharge (Final Oxygen Re-Evaluation): Oxygen Re-Evaluation - 10/23/17 0717      Program Oxygen Prescription   Program Oxygen Prescription  Continuous    Liters per minute  2    Comments  Patient desaturated to 85% on her 6MWT. Will monitor during first day to see how she does but its predicted we will need to use atleast 2 liters      Home Oxygen   Home Oxygen Device  Home Concentrator    Sleep Oxygen Prescription  Continuous    Liters per minute  2    Home Exercise Oxygen Prescription  None    Home at Rest Exercise Oxygen Prescription  None    Compliance with Home Oxygen Use  Yes      Goals/Expected Outcomes   Short Term Goals  To learn and exhibit compliance with exercise, home and travel O2 prescription;To learn and understand importance of monitoring SPO2 with pulse oximeter and demonstrate accurate use of the pulse oximeter.;To learn and understand importance of maintaining oxygen saturations>88%;To learn and demonstrate proper pursed lip breathing techniques or other breathing techniques.;To learn and demonstrate proper use of respiratory medications    Long  Term Goals  Exhibits compliance with exercise, home and travel O2 prescription;Verbalizes importance of monitoring SPO2 with pulse oximeter and return demonstration;Maintenance of O2  saturations>88%;Exhibits proper breathing techniques, such as pursed lip breathing or other method taught during program session;Compliance with respiratory medication;Demonstrates proper use of MDI's       Initial Exercise Prescription: Initial Exercise Prescription - 10/02/17 0700      Date of Initial Exercise RX and Referring Provider   Date  10/02/17    Referring Provider  Dr. Lenna Gilford      Oxygen   Oxygen  Continuous    Liters  2      Bike   Level  0.4    Minutes  17      NuStep   Level  2    SPM  80    Minutes  17    METs  1.4      Track   Laps  10    Minutes  17      Prescription Details   Frequency (times per week)  2    Duration  Progress to 45 minutes of aerobic exercise without signs/symptoms of physical distress      Intensity   THRR 40-80% of Max Heartrate  60-120    Ratings of Perceived Exertion  11-13    Perceived Dyspnea  0-4      Progression   Progression  Continue progressive overload as per policy without signs/symptoms or physical distress.      Resistance Training   Training Prescription  Yes    Weight  blue bands    Reps  10-15       Perform Capillary Blood Glucose checks as needed.  Exercise Prescription Changes: Exercise Prescription Changes    Row Name 10/09/17 1200 10/14/17 1500 10/16/17 1300         Response to Exercise   Blood Pressure (Admit)  104/62  122/66  110/60     Blood Pressure (Exercise)  150/70  142/56  134/60  Blood Pressure (Exit)  122/62  110/70  108/60     Heart Rate (Admit)  70 bpm  70 bpm  73 bpm     Heart Rate (Exercise)  99 bpm  103 bpm  108 bpm     Heart Rate (Exit)  81 bpm  76 bpm  84 bpm     Oxygen Saturation (Admit)  92 %  93 %  90 %     Oxygen Saturation (Exercise)  89 %  89 %  89 %     Oxygen Saturation (Exit)  91 %  95 %  91 %     Rating of Perceived Exertion (Exercise)  11  12  12      Perceived Dyspnea (Exercise)  1  0  1     Duration  Progress to 45 minutes of aerobic exercise without  signs/symptoms of physical distress  Progress to 45 minutes of aerobic exercise without signs/symptoms of physical distress  Progress to 45 minutes of aerobic exercise without signs/symptoms of physical distress     Intensity  Other (comment) 40-80% HRR  THRR unchanged  THRR unchanged       Progression   Progression  Continue to progress workloads to maintain intensity without signs/symptoms of physical distress.  Continue to progress workloads to maintain intensity without signs/symptoms of physical distress.  Continue to progress workloads to maintain intensity without signs/symptoms of physical distress.       Resistance Training   Training Prescription  Yes  Yes  Yes     Weight  blue bands  blue bands  blue bands     Reps  10-15  10-15  10-15     Time  -  10 Minutes  10 Minutes       Bike   Level  - carpel tunnel was bothered by AD bike, switched to recumbent  -  -       Recumbant Bike   Level  1  1  2      Minutes  17  17  17        NuStep   Level  2  2  -     Minutes  17  17  -     METs  2  2.1  -       Track   Laps  14  19  19      Minutes  17  17  17         Exercise Comments:   Exercise Goals and Review: Exercise Goals    Row Name 09/26/17 1051             Exercise Goals   Increase Physical Activity  Yes       Intervention  Provide advice, education, support and counseling about physical activity/exercise needs.;Develop an individualized exercise prescription for aerobic and resistive training based on initial evaluation findings, risk stratification, comorbidities and participant's personal goals.       Expected Outcomes  Achievement of increased cardiorespiratory fitness and enhanced flexibility, muscular endurance and strength shown through measurements of functional capacity and personal statement of participant.       Increase Strength and Stamina  Yes       Intervention  Provide advice, education, support and counseling about physical activity/exercise  needs.;Develop an individualized exercise prescription for aerobic and resistive training based on initial evaluation findings, risk stratification, comorbidities and participant's personal goals.       Expected Outcomes  Achievement of increased cardiorespiratory fitness  and enhanced flexibility, muscular endurance and strength shown through measurements of functional capacity and personal statement of participant.       Able to understand and use rate of perceived exertion (RPE) scale  Yes       Intervention  Provide education and explanation on how to use RPE scale       Expected Outcomes  Short Term: Able to use RPE daily in rehab to express subjective intensity level;Long Term:  Able to use RPE to guide intensity level when exercising independently       Able to understand and use Dyspnea scale  Yes       Intervention  Provide education and explanation on how to use Dyspnea scale       Expected Outcomes  Short Term: Able to use Dyspnea scale daily in rehab to express subjective sense of shortness of breath during exertion;Long Term: Able to use Dyspnea scale to guide intensity level when exercising independently       Knowledge and understanding of Target Heart Rate Range (THRR)  Yes       Intervention  Provide education and explanation of THRR including how the numbers were predicted and where they are located for reference       Expected Outcomes  Short Term: Able to state/look up THRR       Understanding of Exercise Prescription  Yes       Intervention  Provide education, explanation, and written materials on patient's individual exercise prescription       Expected Outcomes  Short Term: Able to explain program exercise prescription;Long Term: Able to explain home exercise prescription to exercise independently          Exercise Goals Re-Evaluation : Exercise Goals Re-Evaluation    Row Name 10/16/17 0728             Exercise Goal Re-Evaluation   Exercise Goals Review  Increase  Strength and Stamina;Increase Physical Activity;Able to understand and use Dyspnea scale;Able to understand and use rate of perceived exertion (RPE) scale;Knowledge and understanding of Target Heart Rate Range (THRR);Understanding of Exercise Prescription       Comments  Patient has only attended 2 sessions. Will cont. to monitor and progress as able.       Expected Outcomes  Through exercise at rehab and at home, patient will increase strength and stamina making ADL's easier to perform. Patient will also have a better understanding of safe exercise and what they are capable to do outside of clinical supervision.          Discharge Exercise Prescription (Final Exercise Prescription Changes): Exercise Prescription Changes - 10/16/17 1300      Response to Exercise   Blood Pressure (Admit)  110/60    Blood Pressure (Exercise)  134/60    Blood Pressure (Exit)  108/60    Heart Rate (Admit)  73 bpm    Heart Rate (Exercise)  108 bpm    Heart Rate (Exit)  84 bpm    Oxygen Saturation (Admit)  90 %    Oxygen Saturation (Exercise)  89 %    Oxygen Saturation (Exit)  91 %    Rating of Perceived Exertion (Exercise)  12    Perceived Dyspnea (Exercise)  1    Duration  Progress to 45 minutes of aerobic exercise without signs/symptoms of physical distress    Intensity  THRR unchanged      Progression   Progression  Continue to progress workloads to maintain intensity without signs/symptoms  of physical distress.      Resistance Training   Training Prescription  Yes    Weight  blue bands    Reps  10-15    Time  10 Minutes      Recumbant Bike   Level  2    Minutes  17      Track   Laps  19    Minutes  17       Nutrition:  Target Goals: Understanding of nutrition guidelines, daily intake of sodium '1500mg'$ , cholesterol '200mg'$ , calories 30% from fat and 7% or less from saturated fats, daily to have 5 or more servings of fruits and vegetables.  Biometrics: Pre Biometrics - 09/26/17 1053       Pre Biometrics   Grip Strength  32 kg        Nutrition Therapy Plan and Nutrition Goals: Nutrition Therapy & Goals - 10/10/17 1426      Nutrition Therapy   Diet  General, Healthful      Personal Nutrition Goals   Nutrition Goal  The pt will recognize symptoms that can interfere with adequate oral intake, such as shortness of breath, N/V, early satiety, fatigue, ability to secure and prepare food, taste and smell changes, chewing/swallowing difficulties, and/ or pain when eating.    Personal Goal #2  Identify food quantities necessary to achieve wt loss of  -2# per week to a goal wt loss of 6-20 lb at graduation from pulmonary rehab.      Intervention Plan   Intervention  Prescribe, educate and counsel regarding individualized specific dietary modifications aiming towards targeted core components such as weight, hypertension, lipid management, diabetes, heart failure and other comorbidities.    Expected Outcomes  Short Term Goal: Understand basic principles of dietary content, such as calories, fat, sodium, cholesterol and nutrients.;Long Term Goal: Adherence to prescribed nutrition plan.       Nutrition Discharge: Rate Your Plate Scores: Nutrition Assessments - 10/13/17 1521      Rate Your Plate Scores   Pre Score  45       Nutrition Goals Re-Evaluation:   Nutrition Goals Discharge (Final Nutrition Goals Re-Evaluation):   Psychosocial: Target Goals: Acknowledge presence or absence of significant depression and/or stress, maximize coping skills, provide positive support system. Participant is able to verbalize types and ability to use techniques and skills needed for reducing stress and depression.  Initial Review & Psychosocial Screening: Initial Psych Review & Screening - 09/26/17 1121      Initial Review   Current issues with  Current Depression;Current Stress Concerns only related to current smoking    Comments  related to smoking and unability to remain cigarette free       [place]?  Yes    Concerns  No support system    Comments  as it related to smoking. she cannot tell her family that she still smokes and it bothers her to the point she smokes and crys at the same time.      Barriers   Psychosocial barriers to participate in program  The patient should benefit from training in stress management and relaxation.      Screening Interventions   Interventions  Encouraged to exercise;To provide support and resources with identified psychosocial needs       Quality of Life Scores:   PHQ-9: Recent Review Flowsheet Data    Depression screen [facility] 2/9 09/26/2017 09/17/2016   Decreased Interest 0 0   Down,  Depressed, Hopeless 3  0   PHQ - 2 Score 3 0   Altered sleeping 0 -   Tired, decreased energy 0 -   Feeling bad or failure about yourself  3  -   Trouble concentrating 0 -   Moving slowly or fidgety/restless 0 -   Suicidal thoughts 0 -   PHQ-9 Score 6 -   Difficult doing work/chores Not difficult at all -     Interpretation of Total Score  Total Score Depression Severity:  1-4 = Minimal depression, 5-9 = Mild depression, 10-14 = Moderate depression, 15-19 = Moderately severe depression, 20-27 = Severe depression   Psychosocial Evaluation and Intervention: Psychosocial Evaluation - 09/26/17 1127      Psychosocial Evaluation & Interventions   Interventions  Encouraged to exercise with the program and follow exercise prescription    Continue Psychosocial Services   Follow up required by staff       Psychosocial Re-Evaluation: Psychosocial Re-Evaluation    [place] Name 10/23/17 0721             Psychosocial Re-Evaluation   Current issues with  Current Depression;Current Stress Concerns       Comments  related to smoking       Expected Outcomes  patient will remain free from psychosocial barriers to participation in pulmonary rehab.       Continue Psychosocial Services   Follow up required by staff        Comments  related to smoking and unability to remain cigarette free          Psychosocial Discharge (Final Psychosocial Re-Evaluation): Psychosocial Re-Evaluation - 10/23/17 0721      Psychosocial Re-Evaluation   Current issues with  Current Depression;Current Stress Concerns    Comments  related to smoking    Expected Outcomes  patient will remain free from psychosocial barriers to participation in pulmonary rehab.    Continue Psychosocial Services   Follow up required by staff    Comments  related to smoking and unability to remain cigarette free       Education: Education Goals: Education classes will be provided on a weekly basis, covering required topics. Participant will state understanding/return demonstration of topics presented.  Learning Barriers/Preferences: Learning Barriers/Preferences - 09/26/17 1118      Learning Barriers/Preferences   Learning Barriers  None    Learning Preferences  Computer/Internet;Individual Instruction;Verbal Instruction;Written Material;Video;Skilled Demonstration       Education Topics: Risk Factor Reduction:  -Group instruction that is supported by a PowerPoint presentation. Instructor discusses the definition of a risk factor, different risk factors for pulmonary disease, and how the heart and lungs work together.     Nutrition for Pulmonary Patient:  -Group instruction provided by PowerPoint slides, verbal discussion, and written materials to support subject matter. The instructor gives an explanation and review of healthy diet recommendations, which includes a discussion on weight management, recommendations for fruit and vegetable consumption, as well as protein, fluid, caffeine, fiber, sodium, sugar, and alcohol. Tips for eating when patients are short of breath are discussed.   Pursed Lip Breathing:  -Group instruction that is supported by demonstration and informational handouts. Instructor discusses the benefits of pursed lip and  diaphragmatic breathing and detailed demonstration on how to preform both.     Oxygen Safety:  -Group instruction provided by PowerPoint, verbal discussion, and written material to support subject matter. There is an overview of "What is Oxygen" and "Why do we need it".  Instructor also reviews  how to create a safe environment for oxygen use, the importance of using oxygen as prescribed, and the risks of noncompliance. There is a brief discussion on traveling with oxygen and resources the patient may utilize.   Oxygen Equipment:  -Group instruction provided by [facility] Staff utilizing handouts, written materials, and equipment demonstrations.   Signs and Symptoms:  -Group instruction provided by written material and verbal discussion to support subject matter. Warning signs and symptoms of infection, stroke, and heart attack are reviewed and when to call the physician/911 reinforced. Tips for preventing the spread of infection discussed.   Advanced Directives:  -Group instruction provided by verbal instruction and written material to support subject matter. Instructor reviews Advanced Directive laws and proper instruction for filling out document.   Pulmonary Video:  -Group video education that reviews the importance of medication and oxygen compliance, exercise, good nutrition, pulmonary hygiene, and pursed lip and diaphragmatic breathing for the pulmonary patient.   Exercise for the Pulmonary Patient:  -Group instruction that is supported by a PowerPoint presentation. Instructor discusses benefits of exercise, core components of exercise, frequency, duration, and intensity of an exercise routine, importance of utilizing pulse oximetry during exercise, safety while exercising, and options of places to exercise outside of rehab.     Pulmonary Medications:  -Verbally interactive group education provided by instructor with focus on inhaled medications and proper  administration.   Anatomy and Physiology of the Respiratory System and Intimacy:  -Group instruction provided by PowerPoint, verbal discussion, and written material to support subject matter. Instructor reviews respiratory cycle and anatomical components of the respiratory system and their functions. Instructor also reviews differences in obstructive and restrictive respiratory diseases with examples of each. Intimacy, Sex, and Sexuality differences are reviewed with a discussion on how relationships can change when diagnosed with pulmonary disease. Common sexual concerns are reviewed.   PULMONARY REHAB OTHER RESPIRATORY from 10/16/2017 in [place]  Date  10/16/17  Educator  RN  Instruction Review Code  2- meets goals/outcomes      MD DAY -A group question and answer session with a medical doctor that allows participants to ask questions that relate to their pulmonary disease state.   OTHER EDUCATION -Group or individual verbal, written, or video instructions that support the educational goals of the pulmonary rehab program.   Knowledge Questionnaire Score: Knowledge Questionnaire Score - 10/02/17 0916      Knowledge Questionnaire Score   Pre Score  11/13       Core Components/Risk Factors/Patient Goals at Admission: Personal Goals and Risk Factors at Admission - 09/26/17 1119      Core Components/Risk Factors/Patient Goals on Admission   Tobacco Cessation  Yes    Number of packs per day  1 pack per 2 days. given "fake" cigarette, quit smart handout, started welbutrin Monday, awaiting for therapist appointment from primary MD.  she has successfully quit smoking multiple times and remained cigarette free for up to 6 months at a time.    Intervention  Assist the participant in steps to quit. Provide individualized education and counseling about committing to Tobacco Cessation, relapse prevention, and pharmacological support that can be provided by  physician.;Advice worker, assist with locating and accessing local/national Quit Smoking programs, and support quit date choice.    Expected Outcomes  Short Term: Will demonstrate readiness to quit, by selecting a quit date.;Short Term: Will quit all tobacco product use, adhering to prevention of relapse plan.;Long Term: Complete abstinence from  all tobacco products for at least 12 months from quit date.    Improve shortness of breath with ADL's  Yes    Intervention  Provide education, individualized exercise plan and daily activity instruction to help decrease symptoms of SOB with activities of daily living.    Expected Outcomes  Short Term: Achieves a reduction of symptoms when performing activities of daily living.    Develop more efficient breathing techniques such as purse lipped breathing and diaphragmatic breathing; and practicing self-pacing with activity  Yes    Intervention  Provide education, demonstration and support about specific breathing techniuqes utilized for more efficient breathing. Include techniques such as pursed lipped breathing, diaphragmatic breathing and self-pacing activity.    Expected Outcomes  Short Term: Participant will be able to demonstrate and use breathing techniques as needed throughout daily activities.    Increase knowledge of respiratory medications and ability to use respiratory devices properly   Yes    Intervention  Provide education and demonstration as needed of appropriate use of medications, inhalers, and oxygen therapy.    Expected Outcomes  Short Term: Achieves understanding of medications use. Understands that oxygen is a medication prescribed by physician. Demonstrates appropriate use of inhaler and oxygen therapy.       Core Components/Risk Factors/Patient Goals Review:  Goals and Risk Factor Review    Row Name 10/23/17 0718             Core Components/Risk Factors/Patient Goals Review   Personal Goals Review  Increase  knowledge of respiratory medications and ability to use respiratory devices properly.;Improve shortness of breath with ADL's;Develop more efficient breathing techniques such as purse lipped breathing and diaphragmatic breathing and practicing self-pacing with activity.;Tobacco Cessation       Review  Patient is already tolerating workload increases in pulmonary rehab. She continues to smoke but is contemplating speaking with her PCP about medications to help her stop. She states she still has her quit smart information. It is too soon in the program to evaluate progression towards other goals. Expect to see progress over the next 30 days.        Expected Outcomes  see "admission expected outcomes"          Core Components/Risk Factors/Patient Goals at Discharge (Final Review):  Goals and Risk Factor Review - 10/23/17 0718      Core Components/Risk Factors/Patient Goals Review   Personal Goals Review  Increase knowledge of respiratory medications and ability to use respiratory devices properly.;Improve shortness of breath with ADL's;Develop more efficient breathing techniques such as purse lipped breathing and diaphragmatic breathing and practicing self-pacing with activity.;Tobacco Cessation    Review  Patient is already tolerating workload increases in pulmonary rehab. She continues to smoke but is contemplating speaking with her PCP about medications to help her stop. She states she still has her quit smart information. It is too soon in the program to evaluate progression towards other goals. Expect to see progress over the next 30 days.     Expected Outcomes  see "admission expected outcomes"       ITP Comments:   Comments: Patient has attended 3 session since admission to pulmonary rehab

## 2017-10-27 DIAGNOSIS — H109 Unspecified conjunctivitis: Secondary | ICD-10-CM | POA: Diagnosis not present

## 2017-10-27 DIAGNOSIS — J329 Chronic sinusitis, unspecified: Secondary | ICD-10-CM | POA: Diagnosis not present

## 2017-10-29 ENCOUNTER — Telehealth (HOSPITAL_COMMUNITY): Payer: Self-pay | Admitting: Family Medicine

## 2017-10-29 ENCOUNTER — Encounter (HOSPITAL_COMMUNITY): Payer: Self-pay | Admitting: *Deleted

## 2017-10-30 ENCOUNTER — Encounter (HOSPITAL_COMMUNITY): Payer: Medicare Other

## 2017-11-04 ENCOUNTER — Encounter (HOSPITAL_COMMUNITY)
Admission: RE | Admit: 2017-11-04 | Discharge: 2017-11-04 | Disposition: A | Discharge status: Home / Self Care | Payer: Medicare Other | Source: Ambulatory Visit | Attending: Pulmonary Disease | Admitting: Pulmonary Disease

## 2017-11-04 VITALS — Wt 195.5 lb

## 2017-11-04 DIAGNOSIS — J439 Emphysema, unspecified: Secondary | ICD-10-CM | POA: Diagnosis not present

## 2017-11-04 NOTE — Progress Notes (Signed)
Daily Session Note  Patient Details  Name: Lori Day MRN: [id number] Date of Birth: 1946/08/23 Referring Provider:     Pulmonary Rehab Walk Test from 09/30/2017 in [place]  Referring Provider  Dr. Lenna Gilford      Encounter Date: 11/04/2017  Check In: Session Check In - 11/04/17 1030      Check-In   Location  [facility]    Staff Present  Rosebud Poles, RN, Luisa Hart, RN, BSN;Molly diVincenzo, MS, ACSM RCEP, Exercise Physiologist;Lendon George Ysidro Evert, RN    Supervising physician immediately available to respond to emergencies  [facility] immediately available    Physician(s)  Dr. Eliseo Squires    Medication changes reported      No    Fall or balance concerns reported     No    Tobacco Cessation  No Change    Warm-up and Cool-down  Performed as group-led instruction    Resistance Training Performed  Yes    VAD Patient?  No      Pain Assessment   Currently in Pain?  No/denies    Multiple Pain Sites  No       Capillary Blood Glucose: No results found for this or any previous visit (from the past 24 hour(s)).  Exercise Prescription Changes - 11/04/17 1400      Response to Exercise   Blood Pressure (Admit)  116/60    Blood Pressure (Exercise)  120/62    Blood Pressure (Exit)  100/60    Heart Rate (Admit)  77 bpm    Heart Rate (Exercise)  103 bpm    Heart Rate (Exit)  87 bpm    Oxygen Saturation (Admit)  92 %    Oxygen Saturation (Exercise)  91 %    Oxygen Saturation (Exit)  91 %    Rating of Perceived Exertion (Exercise)  13    Perceived Dyspnea (Exercise)  1    Duration  Progress to 45 minutes of aerobic exercise without signs/symptoms of physical distress    Intensity  THRR unchanged      Progression   Progression  Continue to progress workloads to maintain intensity without signs/symptoms of physical distress.      Resistance Training   Training Prescription  Yes    Weight  blue bands    Reps  10-15    Time  10  Minutes      Recumbant Bike   Level  2    Minutes  17      NuStep   Level  3    Minutes  17    METs  2      Track   Laps  18    Minutes  17       Social History   Tobacco Use  Smoking Status Current Every Day Smoker  . Packs/day: 1.00  . Years: 50.00  . Pack years: 50.00  . Types: Cigarettes  . Last attempt to quit: 02/25/2017  . Years since quitting: 0.6  Smokeless Tobacco Never Used    Goals Met:  Exercise tolerated well No report of cardiac concerns or symptoms Strength training completed today  Goals Unmet:  Not Applicable  Comments: Service time is from 1030 to 1205    Dr. Rush Farmer is Medical Director for Pulmonary Rehab at [facility].

## 2017-11-06 ENCOUNTER — Other Ambulatory Visit: Payer: Self-pay | Admitting: Obstetrics & Gynecology

## 2017-11-06 ENCOUNTER — Encounter (HOSPITAL_COMMUNITY)
Admission: RE | Admit: 2017-11-06 | Discharge: 2017-11-06 | Disposition: A | Discharge status: Home / Self Care | Payer: Medicare Other | Source: Ambulatory Visit | Attending: Pulmonary Disease | Admitting: Pulmonary Disease

## 2017-11-06 VITALS — Wt 196.7 lb

## 2017-11-06 DIAGNOSIS — J439 Emphysema, unspecified: Secondary | ICD-10-CM | POA: Diagnosis not present

## 2017-11-06 DIAGNOSIS — N644 Mastodynia: Secondary | ICD-10-CM | POA: Diagnosis not present

## 2017-11-06 NOTE — Progress Notes (Signed)
Daily Session Note  Patient Details  Name: Lori Day MRN: [id number] Date of Birth: [birth year] Referring Provider:     Pulmonary Rehab Walk Test from 09/30/2017 in [place]  Referring Provider  Dr. Lenna Gilford      Encounter Date: 11/06/2017  Check In: Session Check In - 11/06/17 1100      Check-In   Location  [facility]    Staff Present  Su Hilt, MS, ACSM RCEP, Exercise Physiologist;Lisa Ysidro Evert, RN;Roxy Mastandrea Woodward, RN, Maxcine Ham, RN, BSN    Supervising physician immediately available to respond to emergencies  [facility] immediately available    Physician(s)  Gher[place]he    Medication changes reported      No    Fall or balance concerns reported     No    Tobacco Cessation  No Change    Warm-up and Cool-down  Performed as group-led Higher education careers adviser Performed  Yes    VAD Patient?  No      Pain Assessment   Currently in Pain?  No/denies    Multiple Pain Sites  No       Capillary Blood Glucose: No results found for this or any previous visit (from the past 24 hour(s)).  Exercise Prescription Changes - 11/06/17 1247      Response to Exercise   Blood Pressure (Admit)  118/70    Blood Pressure (Exercise)  130/78    Blood Pressure (Exit)  126/50    Heart Rate (Admit)  86 bpm    Heart Rate (Exercise)  111 bpm    Heart Rate (Exit)  94 bpm    Oxygen Saturation (Admit)  89 %    Oxygen Saturation (Exercise)  88 %    Oxygen Saturation (Exit)  92 %    Rating of Perceived Exertion (Exercise)  12    Perceived Dyspnea (Exercise)  1    Duration  Progress to 45 minutes of aerobic exercise without signs/symptoms of physical distress    Intensity  THRR unchanged      Progression   Progression  Continue to progress workloads to maintain intensity without signs/symptoms of physical distress.      Resistance Training   Training Prescription  Yes    Weight  blue bands    Reps  10-15    Time  10  Minutes      NuStep   Level  3    Minutes  17    METs  2.1      Track   Laps  21    Minutes  17       Social History   Tobacco Use  Smoking Status Current Every Day Smoker  . Packs/day: 1.00  . Years: 50.00  . Pack years: 50.00  . Types: Cigarettes  . Last attempt to quit: 02/25/2017  . Years since quitting: 0.6  Smokeless Tobacco Never Used    Goals Met:  Independence with exercise equipment Improved SOB with ADL's Exercise tolerated well Queuing for purse lip breathing Strength training completed today  Goals Unmet:  Not Applicable  Comments: Service time is from 1030 to 1230   Dr. Rush Farmer is Medical Director for Pulmonary Rehab at [facility].

## 2017-11-07 ENCOUNTER — Other Ambulatory Visit: Payer: Self-pay | Admitting: Obstetrics & Gynecology

## 2017-11-07 DIAGNOSIS — N644 Mastodynia: Secondary | ICD-10-CM

## 2017-11-11 ENCOUNTER — Encounter (HOSPITAL_COMMUNITY)
Admission: RE | Admit: 2017-11-11 | Discharge: 2017-11-11 | Disposition: A | Discharge status: Home / Self Care | Payer: Medicare Other | Source: Ambulatory Visit | Attending: Pulmonary Disease | Admitting: Pulmonary Disease

## 2017-11-11 VITALS — Wt 196.9 lb

## 2017-11-11 DIAGNOSIS — J439 Emphysema, unspecified: Secondary | ICD-10-CM | POA: Diagnosis not present

## 2017-11-11 NOTE — Progress Notes (Signed)
Daily Session Note  Patient Details  Name: Lori Day MRN: [id number] Date of Birth: 08/24/1946 Referring Provider:     Pulmonary Rehab Walk Test from 09/30/2017 in [place]  Referring Provider  Dr. Lenna Gilford      Encounter Date: 11/11/2017  Check In: Session Check In - 11/11/17 1030      Check-In   Location  [facility]    Staff Present  Rosebud Poles, RN, Luisa Hart, RN, BSN;Molly diVincenzo, MS, ACSM RCEP, Exercise Physiologist;Deysha Cartier Ysidro Evert, RN    Supervising physician immediately available to respond to emergencies  [facility] immediately available    Physician(s)  Dr. Eliseo Squires    Medication changes reported      No    Fall or balance concerns reported     No    Tobacco Cessation  No Change    Warm-up and Cool-down  Performed as group-led instruction    Resistance Training Performed  Yes    VAD Patient?  No      Pain Assessment   Currently in Pain?  No/denies    Multiple Pain Sites  No       Capillary Blood Glucose: No results found for this or any previous visit (from the past 24 hour(s)).  Exercise Prescription Changes - 11/11/17 1200      Response to Exercise   Blood Pressure (Admit)  106/54    Blood Pressure (Exercise)  130/70    Blood Pressure (Exit)  112/60    Heart Rate (Admit)  80 bpm    Heart Rate (Exercise)  104 bpm    Heart Rate (Exit)  94 bpm    Oxygen Saturation (Admit)  91 %    Oxygen Saturation (Exercise)  90 %    Oxygen Saturation (Exit)  92 %    Rating of Perceived Exertion (Exercise)  12    Perceived Dyspnea (Exercise)  1    Duration  Progress to 45 minutes of aerobic exercise without signs/symptoms of physical distress    Intensity  THRR unchanged      Progression   Progression  Continue to progress workloads to maintain intensity without signs/symptoms of physical distress.      Resistance Training   Training Prescription  Yes    Weight  blue bands    Reps  10-15    Time  10  Minutes      Recumbant Bike   Level  2    Minutes  17      NuStep   Level  4    Minutes  17    METs  2.6      Track   Laps  21    Minutes  17       Social History   Tobacco Use  Smoking Status Current Every Day Smoker  . Packs/day: 1.00  . Years: 50.00  . Pack years: 50.00  . Types: Cigarettes  . Last attempt to quit: 02/25/2017  . Years since quitting: 0.7  Smokeless Tobacco Never Used    Goals Met:  Exercise tolerated well No report of cardiac concerns or symptoms Strength training completed today  Goals Unmet:  Not Applicable  Comments: Service time is from 1030 to 1200    Dr. Rush Farmer is Medical Director for Pulmonary Rehab at [facility].

## 2017-11-13 ENCOUNTER — Encounter (HOSPITAL_COMMUNITY)
Admission: RE | Admit: 2017-11-13 | Discharge: 2017-11-13 | Disposition: A | Discharge status: Home / Self Care | Payer: Medicare Other | Source: Ambulatory Visit | Attending: Pulmonary Disease | Admitting: Pulmonary Disease

## 2017-11-13 VITALS — Wt 195.8 lb

## 2017-11-13 DIAGNOSIS — J439 Emphysema, unspecified: Secondary | ICD-10-CM

## 2017-11-13 NOTE — Progress Notes (Signed)
Daily Session Note  Patient Details  Name: Lori Day MRN: [id number] Date of Birth: February 16, 1946 Referring Provider:     Pulmonary Rehab Walk Test from 09/30/2017 in [place]  Referring Provider  Dr. Lenna Gilford      Encounter Date: 11/13/2017  Check In: Session Check In - 11/13/17 1054      Check-In   Location  [facility]    Staff Present  Rodney Langton, RN;Vernel Langenderfer Leonia Reeves, RN, BSN;Molly diVincenzo, MS, ACSM RCEP, Exercise Physiologist;Portia Rollene Rotunda, RN, BSN    Supervising physician immediately available to respond to emergencies  [facility] immediately available    Physician(s)  Dr. Cathlean Sauer    Medication changes reported      No    Fall or balance concerns reported     No    Tobacco Cessation  No Change    Warm-up and Cool-down  Performed as group-led instruction    Resistance Training Performed  Yes    VAD Patient?  No      Pain Assessment   Currently in Pain?  No/denies    Multiple Pain Sites  No       Capillary Blood Glucose: No results found for this or any previous visit (from the past 24 hour(s)).  Exercise Prescription Changes - 11/13/17 1200      Response to Exercise   Blood Pressure (Admit)  100/56    Blood Pressure (Exercise)  124/50    Blood Pressure (Exit)  102/60    Heart Rate (Admit)  76 bpm    Heart Rate (Exercise)  105 bpm    Heart Rate (Exit)  83 bpm    Oxygen Saturation (Admit)  94 %    Oxygen Saturation (Exercise)  91 %    Oxygen Saturation (Exit)  93 %    Rating of Perceived Exertion (Exercise)  12    Perceived Dyspnea (Exercise)  0    Duration  Progress to 45 minutes of aerobic exercise without signs/symptoms of physical distress    Intensity  THRR unchanged      Progression   Progression  Continue to progress workloads to maintain intensity without signs/symptoms of physical distress.      Resistance Training   Training Prescription  Yes    Weight  blue bands    Reps  10-15    Time  10  Minutes      Recumbant Bike   Level  3    Minutes  17      NuStep   Level  4    Minutes  17    METs  2.5       Social History   Tobacco Use  Smoking Status Current Every Day Smoker  . Packs/day: 1.00  . Years: 50.00  . Pack years: 50.00  . Types: Cigarettes  . Last attempt to quit: 02/25/2017  . Years since quitting: 0.7  Smokeless Tobacco Never Used    Goals Met:  Exercise tolerated well Strength training completed today  Goals Unmet:  Not Applicable  Comments: Service time is from 1030 to 1215    Dr. Rush Farmer is Medical Director for Pulmonary Rehab at [facility].

## 2017-11-13 NOTE — Progress Notes (Signed)
I have reviewed a Home Exercise Prescription with Lori Day . Lori Day is not currently exercising at home.  The patient was advised to walk 2-3 days a week for 30 minutes.  Lori Day and I discussed how to progress their exercise prescription.  The patient stated that their goals were to become more active, decrease shortness of breath, and to quit smoking.  The patient stated that they understand the exercise prescription.  We reviewed exercise guidelines, target heart rate during exercise, oxygen use, weather, home pulse oximeter, endpoints for exercise, and goals.  Patient is encouraged to come to me with any questions. I will continue to follow up with the patient to assist them with progression and safety.

## 2017-11-18 ENCOUNTER — Ambulatory Visit
Admission: RE | Admit: 2017-11-18 | Discharge: 2017-11-18 | Disposition: A | Discharge status: Home / Self Care | Payer: Medicare Other | Source: Ambulatory Visit | Attending: Obstetrics & Gynecology | Admitting: Obstetrics & Gynecology

## 2017-11-18 ENCOUNTER — Encounter (HOSPITAL_COMMUNITY)
Admission: RE | Admit: 2017-11-18 | Discharge: 2017-11-18 | Disposition: A | Discharge status: Home / Self Care | Payer: Medicare Other | Source: Ambulatory Visit | Attending: Pulmonary Disease | Admitting: Pulmonary Disease

## 2017-11-18 ENCOUNTER — Other Ambulatory Visit: Payer: Self-pay | Admitting: Obstetrics & Gynecology

## 2017-11-18 VITALS — Wt 195.8 lb

## 2017-11-18 DIAGNOSIS — R922 Inconclusive mammogram: Secondary | ICD-10-CM | POA: Diagnosis not present

## 2017-11-18 DIAGNOSIS — N644 Mastodynia: Secondary | ICD-10-CM

## 2017-11-18 DIAGNOSIS — N6310 Unspecified lump in the right breast, unspecified quadrant: Secondary | ICD-10-CM

## 2017-11-18 DIAGNOSIS — J439 Emphysema, unspecified: Secondary | ICD-10-CM

## 2017-11-18 NOTE — Progress Notes (Signed)
Daily Session Note  Patient Details  Name: Lori Day MRN: [id number] Date of Birth: 26-Nov-1945 Referring Provider:     Pulmonary Rehab Walk Test from 09/30/2017 in [place]  Referring Provider  Dr. Lenna Gilford      Encounter Date: 11/18/2017  Check In: Session Check In - 11/18/17 1014      Check-In   Location  [facility]    Staff Present  Rodney Langton, RN;Joan Leonia Reeves, RN, BSN;Davien Malone, MS, ACSM RCEP, Exercise Physiologist;Portia Rollene Rotunda, RN, BSN    Supervising physician immediately available to respond to emergencies  [facility] immediately available    Physician(s)  Dr. Zigmund Daniel    Medication changes reported      No    Fall or balance concerns reported     No    Tobacco Cessation  No Change    Warm-up and Cool-down  Performed as group-led instruction    Resistance Training Performed  Yes    VAD Patient?  No      Pain Assessment   Currently in Pain?  No/denies    Multiple Pain Sites  No       Capillary Blood Glucose: No results found for this or any previous visit (from the past 24 hour(s)).  Exercise Prescription Changes - 11/18/17 1200      Response to Exercise   Blood Pressure (Admit)  100/64    Blood Pressure (Exercise)  142/70    Blood Pressure (Exit)  94/64    Heart Rate (Admit)  72 bpm    Heart Rate (Exercise)  119 bpm    Heart Rate (Exit)  88 bpm    Oxygen Saturation (Admit)  96 %    Oxygen Saturation (Exercise)  91 %    Oxygen Saturation (Exit)  92 %    Rating of Perceived Exertion (Exercise)  13    Perceived Dyspnea (Exercise)  1    Duration  Progress to 45 minutes of aerobic exercise without signs/symptoms of physical distress    Intensity  THRR unchanged      Progression   Progression  Continue to progress workloads to maintain intensity without signs/symptoms of physical distress.      Resistance Training   Training Prescription  Yes    Weight  blue bands    Reps  10-15    Time  10  Minutes      Treadmill   MPH  2.8    Grade  0    Minutes  17      Recumbant Bike   Level  3    Minutes  17      NuStep   Level  4    Minutes  17    METs  2.3      Home Exercise Plan   Plans to continue exercise at  Home (comment)    Frequency  Add 3 additional days to program exercise sessions.       Social History   Tobacco Use  Smoking Status Current Every Day Smoker  . Packs/day: 1.00  . Years: 50.00  . Pack years: 50.00  . Types: Cigarettes  . Last attempt to quit: 02/25/2017  . Years since quitting: 0.7  Smokeless Tobacco Never Used    Goals Met:  Exercise tolerated well No report of cardiac concerns or symptoms Strength training completed today  Goals Unmet:  Not Applicable  Comments: Service time is from 10:30a to 12:05p    Dr. Lloyd Huger.  Nelda Marseille is Market researcher for Pulmonary Rehab at [facility].

## 2017-11-18 NOTE — Progress Notes (Signed)
Pulmonary Individual Treatment Plan  Patient Details  Name: Lori Day MRN: [id number] Date of Birth: [birth year] Referring Provider:     Pulmonary Rehab Walk Test from 09/30/2017 in [place]  Referring Provider  Dr. Lenna Gilford      Initial Encounter Date:    Pulmonary Rehab Walk Test from 09/30/2017 in [facility]  Date  10/02/17  Referring Provider  Dr. Lenna Gilford      Visit Diagnosis: Pulmonary emphysema, unspecified emphysema type ([place])  Patient's Home Medications on Admission:   Current Outpatient Medications:  .  Albuterol Sulfate (PROAIR RESPICLICK) 993 (90 Base) MCG/ACT AEPB, Inhale 2 puffs every 6 (six) hours as needed into the lungs., Disp: 1 each, Rfl: 0 .  b complex vitamins tablet, Bid po OTC, Disp: , Rfl:  .  buPROPion (WELLBUTRIN XL) 150 MG 24 hr tablet, TAKE 1 TABLET BY MOUTH EVERY DAY IN THE MORNING, Disp: , Rfl: 1 .  calcium carbonate (CALCIUM 600) 600 MG TABS tablet, Take 600 mg by mouth daily with breakfast., Disp: , Rfl:  .  Calcium-Magnesium-Vitamin D (CALCIUM 1200+D3 PO), Take by mouth., Disp: , Rfl:  .  cetirizine (ZYRTEC) 10 MG tablet, Take 10 mg by mouth daily as needed for allergies., Disp: , Rfl:  .  Cholecalciferol (HM VITAMIN D3) 4000 units CAPS, Take 1 capsule by mouth daily., Disp: , Rfl:  .  FLUoxetine (PROZAC) 20 MG capsule, Take 20 mg by mouth., Disp: , Rfl:  .  fluticasone (FLONASE) 50 MCG/ACT nasal spray, Place 2 sprays into both nostrils daily., Disp: , Rfl:  .  GLUCOSA-CHONDR-NA CHONDR-MSM PO, Take by mouth daily. W/ vit d, Disp: , Rfl:  .  levothyroxine (SYNTHROID, LEVOTHROID) 75 MCG tablet, Take 1 tablet (75 mcg total) by mouth daily before breakfast., Disp: 30 tablet, Rfl: 6 .  omeprazole (PRILOSEC) 20 MG capsule, Take 20 mg by mouth every other day. , Disp: , Rfl:  .  ranitidine (ZANTAC) 150 MG tablet, Take 150 mg by mouth every other day., Disp: , Rfl:  .  Tiotropium  Bromide-Olodaterol (STIOLTO RESPIMAT) 2.5-2.5 MCG/ACT AERS, Inhale 2 puffs into the lungs daily., Disp: 3 Inhaler, Rfl: 4 .  TURMERIC PO, Take by mouth., Disp: , Rfl:  .  valACYclovir (VALTREX) 1000 MG tablet, Take 1,000 mg by mouth 2 (two) times daily., Disp: , Rfl:   Past Medical History: Past Medical History:  Diagnosis Date  . Arthritis   . Bipolar 1 disorder ([place])   . 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 She quit in 2013.   [place] GERD (gastroesophageal reflux disease)   . Glaucoma   . Hiatal hernia   . History of HPV infection   . Hx of chronic sinusitis   . 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 Sciatica   . Sensory disturbance 09/19/2014  . Sensory hearing loss, bilateral   . Sjogren's syndrome ([place]) 09-02-2007  . 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 Thyroid nodule   . Tongue cancer ([place]) 02-02-2013   HPV/ radiation/chemo    Tobacco Use: Social History   Tobacco Use  Smoking Status Current Every Day Smoker  . Packs/day: 1.00  . Years: 50.00  . Pack years: 50.00  . Types: Cigarettes  . Last attempt to quit: 02/25/2017  . Years since quitting: 0.7  Smokeless Tobacco Never Used    Labs: Recent Review Scientist, physiological  There is no flowsheet data to display.      Capillary Blood Glucose: No results found for: GLUCAP   Pulmonary Assessment Scores: Pulmonary Assessment Scores    Row Name 10/02/17 0714 10/02/17 0916       ADL UCSD   ADL Phase  Entry  Entry    SOB Score total  -  22      CAT Score   CAT Score  -  9 Entry      mMRC Score   mMRC Score  1  -       Pulmonary Function Assessment: Pulmonary Function Assessment - 09/26/17 1118      Breath   Bilateral Breath Sounds  Clear    Shortness of Breath  Limiting activity uphill walking       Exercise Target Goals:    Exercise Program Goal: Individual exercise prescription set using results from initial 6 min walk test and THRR  while considering  patient's activity barriers and safety.    Exercise Prescription Goal: Initial exercise prescription builds to 30-45 minutes a day of aerobic activity, 2-3 days per week.  Home exercise guidelines will be given to patient during program as part of exercise prescription that the participant will acknowledge.  Activity Barriers & Risk Stratification: Activity Barriers & Cardiac Risk Stratification - 09/26/17 1050      Activity Barriers & Cardiac Risk Stratification   Activity Barriers  Shortness of Breath tendonitis right wrist       6 Minute Walk:   Oxygen Initial Assessment: Oxygen Initial Assessment - 10/02/17 0713      Initial 6 min Walk   Oxygen Used  None      Program Oxygen Prescription   Program Oxygen Prescription  Continuous    Liters per minute  2    Comments  Patient desaturated to 85% on her 6MWT. Will monitor during first day to see how she does but its predicted we will need to use atleast 2 liters       Oxygen Re-Evaluation: Oxygen Re-Evaluation    Row Name 10/23/17 0717 11/17/17 1515 11/17/17 1516         Program Oxygen Prescription   Program Oxygen Prescription  Continuous  Continuous  (Pended)   None     Liters per minute  2  -  0     Comments  Patient desaturated to 85% on her 6MWT. Will monitor during first day to see how she does but its predicted we will need to use atleast 2 liters  -  -       Home Oxygen   Home Oxygen Device  Home Concentrator  -  Home Concentrator     Sleep Oxygen Prescription  Continuous  -  Continuous     Liters per minute  2  -  2     Home Exercise Oxygen Prescription  None  -  None     Home at Rest Exercise Oxygen Prescription  None  -  None     Compliance with Home Oxygen Use  Yes  -  Yes       Goals/Expected Outcomes   Short Term Goals  To learn and exhibit compliance with exercise, home and travel O2 prescription;To learn and understand importance of monitoring SPO2 with pulse oximeter and demonstrate  accurate use of the pulse oximeter.;To learn and understand importance of maintaining oxygen saturations>88%;To learn and demonstrate proper pursed lip breathing techniques or other breathing techniques.;To learn and demonstrate proper  use of respiratory medications  -  To learn and exhibit compliance with exercise, home and travel O2 prescription;To learn and understand importance of monitoring SPO2 with pulse oximeter and demonstrate accurate use of the pulse oximeter.;To learn and understand importance of maintaining oxygen saturations>88%;To learn and demonstrate proper pursed lip breathing techniques or other breathing techniques.;To learn and demonstrate proper use of respiratory medications     Long  Term Goals  Exhibits compliance with exercise, home and travel O2 prescription;Verbalizes importance of monitoring SPO2 with pulse oximeter and return demonstration;Maintenance of O2 saturations>88%;Exhibits proper breathing techniques, such as pursed lip breathing or other method taught during program session;Compliance with respiratory medication;Demonstrates proper use of MDI's  -  Exhibits compliance with exercise, home and travel O2 prescription;Verbalizes importance of monitoring SPO2 with pulse oximeter and return demonstration;Maintenance of O2 saturations>88%;Exhibits proper breathing techniques, such as pursed lip breathing or other method taught during program session;Compliance with respiratory medication;Demonstrates proper use of MDI's     Comments  -  -  patient exhibits compliance        Oxygen Discharge (Final Oxygen Re-Evaluation): Oxygen Re-Evaluation - 11/17/17 1516      Program Oxygen Prescription   Program Oxygen Prescription  None    Liters per minute  0      Home Oxygen   Home Oxygen Device  Home Concentrator    Sleep Oxygen Prescription  Continuous    Liters per minute  2    Home Exercise Oxygen Prescription  None    Home at Rest Exercise Oxygen Prescription  None      Compliance with Home Oxygen Use  Yes      Goals/Expected Outcomes   Short Term Goals  To learn and exhibit compliance with exercise, home and travel O2 prescription;To learn and understand importance of monitoring SPO2 with pulse oximeter and demonstrate accurate use of the pulse oximeter.;To learn and understand importance of maintaining oxygen saturations>88%;To learn and demonstrate proper pursed lip breathing techniques or other breathing techniques.;To learn and demonstrate proper use of respiratory medications    Long  Term Goals  Exhibits compliance with exercise, home and travel O2 prescription;Verbalizes importance of monitoring SPO2 with pulse oximeter and return demonstration;Maintenance of O2 saturations>88%;Exhibits proper breathing techniques, such as pursed lip breathing or other method taught during program session;Compliance with respiratory medication;Demonstrates proper use of MDI's    Comments  patient exhibits compliance       Initial Exercise Prescription: Initial Exercise Prescription - 10/02/17 0700      Date of Initial Exercise RX and Referring Provider   Date  10/02/17    Referring Provider  Dr. Lenna Gilford      Oxygen   Oxygen  Continuous    Liters  2      Bike   Level  0.4    Minutes  17      NuStep   Level  2    SPM  80    Minutes  17    METs  1.4      Track   Laps  10    Minutes  17      Prescription Details   Frequency (times per week)  2    Duration  Progress to 45 minutes of aerobic exercise without signs/symptoms of physical distress      Intensity   THRR 40-80% of Max Heartrate  60-120    Ratings of Perceived Exertion  11-13    Perceived Dyspnea  0-4  Progression   Progression  Continue progressive overload as per policy without signs/symptoms or physical distress.      Resistance Training   Training Prescription  Yes    Weight  blue bands    Reps  10-15       Perform Capillary Blood Glucose checks as needed.  Exercise  Prescription Changes: Exercise Prescription Changes    Row Name 10/09/17 1200 10/14/17 1500 10/16/17 1300 11/04/17 1400 11/06/17 1247     Response to Exercise   Blood Pressure (Admit)  104/62  122/66  110/60  116/60  118/70   Blood Pressure (Exercise)  150/70  142/56  134/60  120/62  130/78   Blood Pressure (Exit)  122/62  110/70  108/60  100/60  126/50   Heart Rate (Admit)  70 bpm  70 bpm  73 bpm  77 bpm  86 bpm   Heart Rate (Exercise)  99 bpm  103 bpm  108 bpm  103 bpm  111 bpm   Heart Rate (Exit)  81 bpm  76 bpm  84 bpm  87 bpm  94 bpm   Oxygen Saturation (Admit)  92 %  93 %  90 %  92 %  89 %   Oxygen Saturation (Exercise)  89 %  89 %  89 %  91 %  88 %   Oxygen Saturation (Exit)  91 %  95 %  91 %  91 %  92 %   Rating of Perceived Exertion (Exercise)  _0 Perceived Dyspnea (Exercise)  1  0  _1 Duration  Progress to 45 minutes of aerobic exercise without signs/symptoms of physical distress  Progress to 45 minutes of aerobic exercise without signs/symptoms of physical distress  Progress to 45 minutes of aerobic exercise without signs/symptoms of physical distress  Progress to 45 minutes of aerobic exercise without signs/symptoms of physical distress  Progress to 45 minutes of aerobic exercise without signs/symptoms of physical distress   Intensity  Other (comment) 40-80% HRR  THRR unchanged  THRR unchanged  THRR unchanged  THRR unchanged     Progression   Progression  Continue to progress workloads to maintain intensity without signs/symptoms of physical distress.  Continue to progress workloads to maintain intensity without signs/symptoms of physical distress.  Continue to progress workloads to maintain intensity without signs/symptoms of physical distress.  Continue to progress workloads to maintain intensity without signs/symptoms of physical distress.  Continue to progress workloads to maintain intensity without signs/symptoms of physical distress.     Resistance  Training   Training Prescription  Yes  Yes  Yes  Yes  Yes   Weight  blue bands  blue bands  blue bands  blue bands  blue bands   Reps  10-15  10-15  10-15  10-15  10-15   Time  -  10 Minutes  10 Minutes  10 Minutes  10 Minutes     Bike   Level  - carpel tunnel was bothered by AD bike, switched to recumbent  -  -  -  -     Recumbant Bike   Level  _2 -   Minutes  _3 -     NuStep   Level  2  2  -  3  3   Minutes  17  17  -  17  17   METs  2  2.1  -  2  2.1     Track   Laps  _0 Minutes  _1 Row Name 11/11/17 1200 11/13/17 1200           Response to Exercise   Blood Pressure (Admit)  106/54  100/56      Blood Pressure (Exercise)  130/70  124/50      Blood Pressure (Exit)  112/60  102/60      Heart Rate (Admit)  80 bpm  76 bpm      Heart Rate (Exercise)  104 bpm  105 bpm      Heart Rate (Exit)  94 bpm  83 bpm      Oxygen Saturation (Admit)  91 %  94 %      Oxygen Saturation (Exercise)  90 %  91 %      Oxygen Saturation (Exit)  92 %  93 %      Rating of Perceived Exertion (Exercise)  12  12      Perceived Dyspnea (Exercise)  1  0      Duration  Progress to 45 minutes of aerobic exercise without signs/symptoms of physical distress  Progress to 45 minutes of aerobic exercise without signs/symptoms of physical distress      Intensity  THRR unchanged  THRR unchanged        Progression   Progression  Continue to progress workloads to maintain intensity without signs/symptoms of physical distress.  Continue to progress workloads to maintain intensity without signs/symptoms of physical distress.        Resistance Training   Training Prescription  Yes  Yes      Weight  blue bands  blue bands      Reps  10-15  10-15      Time  10 Minutes  10 Minutes        Recumbant Bike   Level  2  3      Minutes  17  17        NuStep   Level  4  4      Minutes  17  17      METs  2.6  2.5        Track   Laps  21  -      [place] to continue exercise at  -  Home (comment)      Frequency  -  Add 3 additional days to program exercise sessions.         Exercise Comments: Exercise Comments    Row Name 11/13/17 1257           Exercise Comments  Home exercise completed          Exercise Goals and Review: Exercise Goals    Row Name 09/26/17 1051             Exercise Goals   Increase Physical Activity  Yes       Intervention  Provide advice, education, support and counseling about physical activity/exercise needs.;Develop an individualized exercise prescription for aerobic and resistive training based on initial evaluation findings, risk stratification, comorbidities and participant's personal goals.       Expected Outcomes  Achievement of increased cardiorespiratory fitness  and enhanced flexibility, muscular endurance and strength shown through measurements of functional capacity and personal statement of participant.       Increase Strength and Stamina  Yes       Intervention  Provide advice, education, support and counseling about physical activity/exercise needs.;Develop an individualized exercise prescription for aerobic and resistive training based on initial evaluation findings, risk stratification, comorbidities and participant's personal goals.       Expected Outcomes  Achievement of increased cardiorespiratory fitness and enhanced flexibility, muscular endurance and strength shown through measurements of functional capacity and personal statement of participant.       Able to understand and use rate of perceived exertion (RPE) scale  Yes       Intervention  Provide education and explanation on how to use RPE scale       Expected Outcomes  Short Term: Able to use RPE daily in rehab to express subjective intensity level;Long Term:  Able to use RPE to guide intensity level when exercising independently       Able to understand and use Dyspnea scale  Yes        Intervention  Provide education and explanation on how to use Dyspnea scale       Expected Outcomes  Short Term: Able to use Dyspnea scale daily in rehab to express subjective sense of shortness of breath during exertion;Long Term: Able to use Dyspnea scale to guide intensity level when exercising independently       Knowledge and understanding of Target Heart Rate Range (THRR)  Yes       Intervention  Provide education and explanation of THRR including how the numbers were predicted and where they are located for reference       Expected Outcomes  Short Term: Able to state/look up THRR       Understanding of Exercise Prescription  Yes       Intervention  Provide education, explanation, and written materials on patient's individual exercise prescription       Expected Outcomes  Short Term: Able to explain program exercise prescription;Long Term: Able to explain home exercise prescription to exercise independently          Exercise Goals Re-Evaluation : Exercise Goals Re-Evaluation    Row Name 10/16/17 1791 11/17/17 0747           Exercise Goal Re-Evaluation   Exercise Goals Review  Increase Strength and Stamina;Increase Physical Activity;Able to understand and use Dyspnea scale;Able to understand and use rate of perceived exertion (RPE) scale;Knowledge and understanding of Target Heart Rate Range (THRR);Understanding of Exercise Prescription  Increase Strength and Stamina;Increase Physical Activity;Able to understand and use Dyspnea scale;Able to understand and use rate of perceived exertion (RPE) scale;Knowledge and understanding of Target Heart Rate Range (THRR);Understanding of Exercise Prescription      Comments  Patient has only attended 2 sessions. Will cont. to monitor and progress as able.  Patient is progressing well in the program. She is still smoking which we have discussed. Home exercise has been completed. Large emphasis on pursed lip breathing with patient since she does tend to  waver in the low 90's and high 80's for oxygen saturation. Patient is able to walk 21 laps (200 ft each) in 15 minutes.       Expected Outcomes  Through exercise at rehab and at home, patient will increase strength and stamina making ADL's easier to perform. Patient will also have a better understanding of safe exercise and what they are  capable to do outside of clinical supervision.  Through exercise at rehab and at home, patient will increase strength and stamina and be less short of breath with ADL's at home.          Discharge Exercise Prescription (Final Exercise Prescription Changes): Exercise Prescription Changes - 11/13/17 1200      Response to Exercise   Blood Pressure (Admit)  100/56    Blood Pressure (Exercise)  124/50    Blood Pressure (Exit)  102/60    Heart Rate (Admit)  76 bpm    Heart Rate (Exercise)  105 bpm    Heart Rate (Exit)  83 bpm    Oxygen Saturation (Admit)  94 %    Oxygen Saturation (Exercise)  91 %    Oxygen Saturation (Exit)  93 %    Rating of Perceived Exertion (Exercise)  12    Perceived Dyspnea (Exercise)  0    Duration  Progress to 45 minutes of aerobic exercise without signs/symptoms of physical distress    Intensity  THRR unchanged      Progression   Progression  Continue to progress workloads to maintain intensity without signs/symptoms of physical distress.      Resistance Training   Training Prescription  Yes    Weight  blue bands    Reps  10-15    Time  10 Minutes      Recumbant Bike   Level  3    Minutes  17      NuStep   Level  4    Minutes  17    METs  2.5      Home Exercise Plan   Plans to continue exercise at  Home (comment)    Frequency  Add 3 additional days to program exercise sessions.       Nutrition:  Target Goals: Understanding of nutrition guidelines, daily intake of sodium <1558m, cholesterol <2063m calories 30% from fat and 7% or less from saturated fats, daily to have 5 or more servings of fruits and  vegetables.  Biometrics: Pre Biometrics - 09/26/17 1053      Pre Biometrics   Grip Strength  32 kg        Nutrition Therapy Plan and Nutrition Goals: Nutrition Therapy & Goals - 10/10/17 1426      Nutrition Therapy   Diet  General, Healthful      Personal Nutrition Goals   Nutrition Goal  The pt will recognize symptoms that can interfere with adequate oral intake, such as shortness of breath, N/V, early satiety, fatigue, ability to secure and prepare food, taste and smell changes, chewing/swallowing difficulties, and/ or pain when eating.    Personal Goal #2  Identify food quantities necessary to achieve wt loss of  -2# per week to a goal wt loss of 6-20 lb at graduation from pulmonary rehab.      Intervention Plan   Intervention  Prescribe, educate and counsel regarding individualized specific dietary modifications aiming towards targeted core components such as weight, hypertension, lipid management, diabetes, heart failure and other comorbidities.    Expected Outcomes  Short Term Goal: Understand basic principles of dietary content, such as calories, fat, sodium, cholesterol and nutrients.;Long Term Goal: Adherence to prescribed nutrition plan.       Nutrition Assessments: Nutrition Assessments - 10/13/17 1521      Rate Your Plate Scores   Pre Score  45       Nutrition Goals Re-Evaluation:   Nutrition Goals Discharge (Final Nutrition  Goals Re-Evaluation):   Psychosocial: Target Goals: Acknowledge presence or absence of significant depression and/or stress, maximize coping skills, provide positive support system. Participant is able to verbalize types and ability to use techniques and skills needed for reducing stress and depression.  Initial Review & Psychosocial Screening: Initial Psych Review & Screening - 09/26/17 1121      Initial Review   Current issues with  Current Depression;Current Stress Concerns only related to current smoking    Comments  related to  smoking and unability to remain cigarette free      [place]?  Yes    Concerns  No support system    Comments  as it related to smoking. she cannot tell her family that she still smokes and it bothers her to the point she smokes and crys at the same time.      Barriers   Psychosocial barriers to participate in program  The patient should benefit from training in stress management and relaxation.      Screening Interventions   Interventions  Encouraged to exercise;To provide support and resources with identified psychosocial needs       Quality of Life Scores:  Scores of 19 and below usually indicate a poorer quality of life in these areas.  A difference of  2-3 points is a clinically meaningful difference.  A difference of 2-3 points in the total score of the Quality of Life Index has been associated with significant improvement in overall quality of life, self-image, physical symptoms, and general health in studies assessing change in quality of life.   PHQ-9: Recent Review Flowsheet Data    Depression screen [facility] 2/9 09/26/2017 09/17/2016   Decreased Interest 0 0   Down, Depressed, Hopeless 3  0   PHQ - 2 Score 3 0   Altered sleeping 0 -   Tired, decreased energy 0 -   Feeling bad or failure about yourself  3  -   Trouble concentrating 0 -   Moving slowly or fidgety/restless 0 -   Suicidal thoughts 0 -   PHQ-9 Score 6 -   Difficult doing work/chores Not difficult at all -     Interpretation of Total Score  Total Score Depression Severity:  1-4 = Minimal depression, 5-9 = Mild depression, 10-14 = Moderate depression, 15-19 = Moderately severe depression, 20-27 = Severe depression   Psychosocial Evaluation and Intervention: Psychosocial Evaluation - 09/26/17 1127      Psychosocial Evaluation & Interventions   Interventions  Encouraged to exercise with the program and follow exercise prescription    Continue Psychosocial Services   Follow up  required by staff       Psychosocial Re-Evaluation: Psychosocial Re-Evaluation    [place] Name 10/23/17 0721 11/17/17 1204           Psychosocial Re-Evaluation   Current issues with  Current Depression;Current Stress Concerns  Current Depression;Current Stress Concerns      Comments  related to smoking  her stress and depression is related to the fact that she has failed so many attempts to quit smoking.      Expected Outcomes  patient will remain free from psychosocial barriers to participation in pulmonary rehab.  patient will remain free from psychosocial barriers to participation in pulmonary rehab.      Interventions  -  Encouraged to attend Pulmonary Rehabilitation for the exercise      Continue Psychosocial Services   Follow up required by staff  Follow up required by staff      Comments  related to smoking and unability to remain cigarette free  -         Psychosocial Discharge (Final Psychosocial Re-Evaluation): Psychosocial Re-Evaluation - 11/17/17 1204      Psychosocial Re-Evaluation   Current issues with  Current Depression;Current Stress Concerns    Comments  her stress and depression is related to the fact that she has failed so many attempts to quit smoking.    Expected Outcomes  patient will remain free from psychosocial barriers to participation in pulmonary rehab.    Interventions  Encouraged to attend Pulmonary Rehabilitation for the exercise    Continue Psychosocial Services   Follow up required by staff       Education: Education Goals: Education classes will be provided on a weekly basis, covering required topics. Participant will state understanding/return demonstration of topics presented.  Learning Barriers/Preferences: Learning Barriers/Preferences - 09/26/17 1118      Learning Barriers/Preferences   Learning Barriers  None    Learning Preferences  Computer/Internet;Individual Instruction;Verbal Instruction;Written Material;Video;Skilled Demonstration        Education Topics: Risk Factor Reduction:  -Group instruction that is supported by a PowerPoint presentation. Instructor discusses the definition of a risk factor, different risk factors for pulmonary disease, and how the heart and lungs work together.     Nutrition for Pulmonary Patient:  -Group instruction provided by PowerPoint slides, verbal discussion, and written materials to support subject matter. The instructor gives an explanation and review of healthy diet recommendations, which includes a discussion on weight management, recommendations for fruit and vegetable consumption, as well as protein, fluid, caffeine, fiber, sodium, sugar, and alcohol. Tips for eating when patients are short of breath are discussed.   Pursed Lip Breathing:  -Group instruction that is supported by demonstration and informational handouts. Instructor discusses the benefits of pursed lip and diaphragmatic breathing and detailed demonstration on how to preform both.     Oxygen Safety:  -Group instruction provided by PowerPoint, verbal discussion, and written material to support subject matter. There is an overview of "What is Oxygen" and "Why do we need it".  Instructor also reviews how to create a safe environment for oxygen use, the importance of using oxygen as prescribed, and the risks of noncompliance. There is a brief discussion on traveling with oxygen and resources the patient may utilize.   Oxygen Equipment:  -Group instruction provided by [facility] Staff utilizing handouts, written materials, and equipment demonstrations.   Signs and Symptoms:  -Group instruction provided by written material and verbal discussion to support subject matter. Warning signs and symptoms of infection, stroke, and heart attack are reviewed and when to call the physician/911 reinforced. Tips for preventing the spread of infection discussed.   Advanced Directives:  -Group instruction provided by verbal  instruction and written material to support subject matter. Instructor reviews Advanced Directive laws and proper instruction for filling out document.   Pulmonary Video:  -Group video education that reviews the importance of medication and oxygen compliance, exercise, good nutrition, pulmonary hygiene, and pursed lip and diaphragmatic breathing for the pulmonary patient.   Exercise for the Pulmonary Patient:  -Group instruction that is supported by a PowerPoint presentation. Instructor discusses benefits of exercise, core components of exercise, frequency, duration, and intensity of an exercise routine, importance of utilizing pulse oximetry during exercise, safety while exercising, and options of places to exercise outside of rehab.     Pulmonary Medications:  -Verbally interactive  group education provided by instructor with focus on inhaled medications and proper administration.   PULMONARY REHAB OTHER RESPIRATORY from 11/13/2017 in [place]  Date  11/13/17  Educator  pharm  Instruction Review Code  2- meets goals/outcomes      Anatomy and Physiology of the Respiratory System and Intimacy:  -Group instruction provided by PowerPoint, verbal discussion, and written material to support subject matter. Instructor reviews respiratory cycle and anatomical components of the respiratory system and their functions. Instructor also reviews differences in obstructive and restrictive respiratory diseases with examples of each. Intimacy, Sex, and Sexuality differences are reviewed with a discussion on how relationships can change when diagnosed with pulmonary disease. Common sexual concerns are reviewed.   PULMONARY REHAB OTHER RESPIRATORY from 11/13/2017 in [place]  Date  10/16/17  Educator  RN  Instruction Review Code  2- meets goals/outcomes      MD DAY -A group question and answer session with a medical doctor that allows  participants to ask questions that relate to their pulmonary disease state.   PULMONARY REHAB OTHER RESPIRATORY from 11/13/2017 in [place]  Date  11/06/17  Educator  yacoub  Instruction Review Code  2- meets goals/outcomes      OTHER EDUCATION -Group or individual verbal, written, or video instructions that support the educational goals of the pulmonary rehab program.   Knowledge Questionnaire Score: Knowledge Questionnaire Score - 10/02/17 0916      Knowledge Questionnaire Score   Pre Score  11/13       Core Components/Risk Factors/Patient Goals at Admission: Personal Goals and Risk Factors at Admission - 09/26/17 1119      Core Components/Risk Factors/Patient Goals on Admission   Tobacco Cessation  Yes    Number of packs per day  1 pack per 2 days. given "fake" cigarette, quit smart handout, started welbutrin Monday, awaiting for therapist appointment from primary MD.  she has successfully quit smoking multiple times and remained cigarette free for up to 6 months at a time.    Intervention  Assist the participant in steps to quit. Provide individualized education and counseling about committing to Tobacco Cessation, relapse prevention, and pharmacological support that can be provided by physician.;Advice worker, assist with locating and accessing local/national Quit Smoking programs, and support quit date choice.    Expected Outcomes  Short Term: Will demonstrate readiness to quit, by selecting a quit date.;Short Term: Will quit all tobacco product use, adhering to prevention of relapse plan.;Long Term: Complete abstinence from all tobacco products for at least 12 months from quit date.    Improve shortness of breath with ADL's  Yes    Intervention  Provide education, individualized exercise plan and daily activity instruction to help decrease symptoms of SOB with activities of daily living.    Expected Outcomes  Short Term: Achieves a  reduction of symptoms when performing activities of daily living.    Develop more efficient breathing techniques such as purse lipped breathing and diaphragmatic breathing; and practicing self-pacing with activity  Yes    Intervention  Provide education, demonstration and support about specific breathing techniuqes utilized for more efficient breathing. Include techniques such as pursed lipped breathing, diaphragmatic breathing and self-pacing activity.    Expected Outcomes  Short Term: Participant will be able to demonstrate and use breathing techniques as needed throughout daily activities.    Increase knowledge of respiratory medications and ability to use respiratory devices  properly   Yes    Intervention  Provide education and demonstration as needed of appropriate use of medications, inhalers, and oxygen therapy.    Expected Outcomes  Short Term: Achieves understanding of medications use. Understands that oxygen is a medication prescribed by physician. Demonstrates appropriate use of inhaler and oxygen therapy.       Core Components/Risk Factors/Patient Goals Review:  Goals and Risk Factor Review    Row Name 10/23/17 0718 11/17/17 1159           Core Components/Risk Factors/Patient Goals Review   Personal Goals Review  Increase knowledge of respiratory medications and ability to use respiratory devices properly.;Improve shortness of breath with ADL's;Develop more efficient breathing techniques such as purse lipped breathing and diaphragmatic breathing and practicing self-pacing with activity.;Tobacco Cessation  Increase knowledge of respiratory medications and ability to use respiratory devices properly.;Improve shortness of breath with ADL's;Develop more efficient breathing techniques such as purse lipped breathing and diaphragmatic breathing and practicing self-pacing with activity.;Tobacco Cessation      Review  Patient is already tolerating workload increases in pulmonary rehab. She  continues to smoke but is contemplating speaking with her PCP about medications to help her stop. She states she still has her quit smart information. It is too soon in the program to evaluate progression towards other goals. Expect to see progress over the next 30 days.   Patient is progressing well in the program. She continues to tolerate workload increases and always pushes herself on the track for an RPE of 11-13. She has "cut back" on her cigarette smoking although she has not contacted the [facility] program. She is observed utilizing PLB for her SOB with exertion. She recently attended an education session with the pharmacist on pulmonary medications including inhaled medications.      Expected Outcomes  see "admission expected outcomes"  see "admission expected outcomes"         Core Components/Risk Factors/Patient Goals at Discharge (Final Review):  Goals and Risk Factor Review - 11/17/17 1159      Core Components/Risk Factors/Patient Goals Review   Personal Goals Review  Increase knowledge of respiratory medications and ability to use respiratory devices properly.;Improve shortness of breath with ADL's;Develop more efficient breathing techniques such as purse lipped breathing and diaphragmatic breathing and practicing self-pacing with activity.;Tobacco Cessation    Review  Patient is progressing well in the program. She continues to tolerate workload increases and always pushes herself on the track for an RPE of 11-13. She has "cut back" on her cigarette smoking although she has not contacted the [facility] program. She is observed utilizing PLB for her SOB with exertion. She recently attended an education session with the pharmacist on pulmonary medications including inhaled medications.    Expected Outcomes  see "admission expected outcomes"       ITP Comments:   Comments: Patient has attended 7 pulmonary rehab sessions since admission

## 2017-11-20 ENCOUNTER — Encounter (HOSPITAL_COMMUNITY)
Admission: RE | Admit: 2017-11-20 | Discharge: 2017-11-20 | Disposition: A | Discharge status: Home / Self Care | Payer: Medicare Other | Source: Ambulatory Visit | Attending: Pulmonary Disease | Admitting: Pulmonary Disease

## 2017-11-20 VITALS — Wt 196.4 lb

## 2017-11-20 DIAGNOSIS — J439 Emphysema, unspecified: Secondary | ICD-10-CM

## 2017-11-20 NOTE — Progress Notes (Signed)
Daily Session Note  Patient Details  Name: Lori Day MRN: [id number] Date of Birth: 08-May-1946 Referring Provider:     Pulmonary Rehab Walk Test from 09/30/2017 in [place]  Referring Provider  Dr. Lenna Gilford      Encounter Date: 11/20/2017  Check In: Session Check In - 11/20/17 1030      Check-In   Location  [facility]    Staff Present  Rosebud Poles, RN, BSN;Molly diVincenzo, MS, ACSM RCEP, Exercise Physiologist;Kypton [place]ringham Rollene Rotunda, RN, BSN    Supervising physician immediately available to respond to emergencies  [facility] immediately available    Physician(s)  Dr. Bonner Puna    Medication changes reported      No    Fall or balance concerns reported     No    Tobacco Cessation  No Change    Warm-up and Cool-down  Performed as group-led instruction    Resistance Training Performed  Yes    VAD Patient?  No      Pain Assessment   Currently in Pain?  No/denies    Multiple Pain Sites  No       Capillary Blood Glucose: No results found for this or any previous visit (from the past 24 hour(s)).  Exercise Prescription Changes - 11/20/17 1231      Response to Exercise   Blood Pressure (Admit)  122/70    Blood Pressure (Exercise)  158/80    Blood Pressure (Exit)  106/58    Heart Rate (Admit)  74 bpm    Heart Rate (Exercise)  116 bpm    Heart Rate (Exit)  72 bpm    Oxygen Saturation (Admit)  95 %    Oxygen Saturation (Exercise)  87 %    Oxygen Saturation (Exit)  95 %    Rating of Perceived Exertion (Exercise)  12    Perceived Dyspnea (Exercise)  0    Duration  Progress to 45 minutes of aerobic exercise without signs/symptoms of physical distress    Intensity  THRR unchanged      Progression   Progression  Continue to progress workloads to maintain intensity without signs/symptoms of physical distress.      Resistance Training   Training Prescription  Yes    Weight  blue bands    Reps  10-15    Time  10 Minutes      Recumbant Bike   Level  4    Minutes  17      Track   Laps  21    Minutes  17      Home Exercise Plan   Plans to continue exercise at  Home (comment)    Frequency  Add 3 additional days to program exercise sessions.       Social History   Tobacco Use  Smoking Status Current Every Day Smoker  . Packs/day: 1.00  . Years: 50.00  . Pack years: 50.00  . Types: Cigarettes  . Last attempt to quit: 02/25/2017  . Years since quitting: 0.7  Smokeless Tobacco Never Used    Goals Met:  Independence with exercise equipment Improved SOB with ADL's Using PLB without cueing & demonstrates good technique Exercise tolerated well No report of cardiac concerns or symptoms Strength training completed today  Goals Unmet:  Not Applicable  Comments: Service time is from 1030 to 1210   Dr. Rush Farmer is Medical Director for Pulmonary Rehab at [facility].

## 2017-11-25 ENCOUNTER — Encounter (HOSPITAL_COMMUNITY)
Admission: RE | Admit: 2017-11-25 | Discharge: 2017-11-25 | Disposition: A | Discharge status: Home / Self Care | Payer: Medicare Other | Source: Ambulatory Visit | Attending: Pulmonary Disease | Admitting: Pulmonary Disease

## 2017-11-25 VITALS — Wt 196.2 lb

## 2017-11-25 DIAGNOSIS — J439 Emphysema, unspecified: Secondary | ICD-10-CM | POA: Diagnosis not present

## 2017-11-25 NOTE — Progress Notes (Signed)
Daily Session Note  Patient Details  Name: Lori Day MRN: [id number] Date of Birth: 08/17/1946 Referring Provider:     Pulmonary Rehab Walk Test from 09/30/2017 in [facility]  Referring Provider  Dr. Nadel      Encounter Date: 11/25/2017  Check In: Session Check In - 11/25/17 1219      Check-In   Location  [facility]    Staff Present  Joan Behrens, RN, BSN;Molly diVincenzo, MS, ACSM RCEP, Exercise Physiologist;Portia Payne, RN, BSN;Lisa Hughes, RN    Supervising physician immediately available to respond to emergencies  [facility] immediately available    Physician(s)  Dr. Grunz    Medication changes reported      No    Fall or balance concerns reported     No    Tobacco Cessation  No Change    Warm-up and Cool-down  Performed as group-led instruction    Resistance Training Performed  Yes    VAD Patient?  No      Pain Assessment   Currently in Pain?  No/denies    Multiple Pain Sites  No       Capillary Blood Glucose: No results found for this or any previous visit (from the past 24 hour(s)).  Exercise Prescription Changes - 11/25/17 1200      Response to Exercise   Blood Pressure (Admit)  114/70    Blood Pressure (Exercise)  150/60    Blood Pressure (Exit)  100/56    Heart Rate (Admit)  77 bpm    Heart Rate (Exercise)  115 bpm    Heart Rate (Exit)  88 bpm    Oxygen Saturation (Admit)  94 %    Oxygen Saturation (Exercise)  88 %    Oxygen Saturation (Exit)  93 %    Rating of Perceived Exertion (Exercise)  12    Perceived Dyspnea (Exercise)  1    Duration  Progress to 45 minutes of aerobic exercise without signs/symptoms of physical distress    Intensity  THRR unchanged      Progression   Progression  Continue to progress workloads to maintain intensity without signs/symptoms of physical distress.      Resistance Training   Training Prescription  Yes    Weight  blue bands    Reps  10-15    Time  10  Minutes      Recumbant Bike   Level  4    Minutes  17      NuStep   Level  4    Minutes  17    METs  2.6      Track   Laps  20    Minutes  17       Social History   Tobacco Use  Smoking Status Current Every Day Smoker  . Packs/day: 1.00  . Years: 50.00  . Pack years: 50.00  . Types: Cigarettes  . Last attempt to quit: 02/25/2017  . Years since quitting: 0.7  Smokeless Tobacco Never Used    Goals Met:  Exercise tolerated well No report of cardiac concerns or symptoms Strength training completed today  Goals Unmet:  Not Applicable  Comments: Service time is from 10:30a to 12:05p    Dr. Wesam G. Yacoub is Medical Director for Pulmonary Rehab at [facility]. 

## 2017-11-27 ENCOUNTER — Encounter (HOSPITAL_COMMUNITY)
Admission: RE | Admit: 2017-11-27 | Discharge: 2017-11-27 | Disposition: A | Discharge status: Home / Self Care | Payer: Medicare Other | Source: Ambulatory Visit | Attending: Pulmonary Disease | Admitting: Pulmonary Disease

## 2017-11-27 VITALS — Wt 194.2 lb

## 2017-11-27 DIAGNOSIS — J439 Emphysema, unspecified: Secondary | ICD-10-CM | POA: Diagnosis not present

## 2017-11-27 NOTE — Progress Notes (Signed)
Daily Session Note  Patient Details  Name: Lori Day MRN: [id number] Date of Birth: 1946/06/03 Referring Provider:     Pulmonary Rehab Walk Test from 09/30/2017 in [place]  Referring Provider  Dr. Lenna Gilford      Encounter Date: 11/27/2017  Check In: Session Check In - 11/27/17 1104      Check-In   Location  [facility]    Staff Present  Rosebud Poles, RN, BSN;Molly diVincenzo, MS, ACSM RCEP, Exercise Physiologist;Portia Rollene Rotunda, RN, Roque Cash, RN    Supervising physician immediately available to respond to emergencies  [facility] immediately available    Physician(s)   Dr. Tyrell Antonio    Medication changes reported      No    Fall or balance concerns reported     No    Tobacco Cessation  No Change    Warm-up and Cool-down  Performed as group-led instruction    Resistance Training Performed  Yes    VAD Patient?  No      Pain Assessment   Currently in Pain?  No/denies    Multiple Pain Sites  No       Capillary Blood Glucose: No results found for this or any previous visit (from the past 24 hour(s)).    Social History   Tobacco Use  Smoking Status Current Every Day Smoker  . Packs/day: 1.00  . Years: 50.00  . Pack years: 50.00  . Types: Cigarettes  . Last attempt to quit: 02/25/2017  . Years since quitting: 0.7  Smokeless Tobacco Never Used    Goals Met:  Exercise tolerated well Strength training completed today  Goals Unmet:  Not Applicable  Comments: Service time is from 1115 to 1330.  She attended the Mental Health education class today.   Dr. Rush Farmer is Medical Director for Pulmonary Rehab at [facility].

## 2017-11-28 ENCOUNTER — Ambulatory Visit (HOSPITAL_COMMUNITY): Payer: Medicare Other

## 2017-12-02 ENCOUNTER — Encounter (HOSPITAL_COMMUNITY)
Admission: RE | Admit: 2017-12-02 | Discharge: 2017-12-02 | Disposition: A | Discharge status: Home / Self Care | Payer: Medicare Other | Source: Ambulatory Visit | Attending: Pulmonary Disease | Admitting: Pulmonary Disease

## 2017-12-02 VITALS — Wt 196.0 lb

## 2017-12-02 DIAGNOSIS — J439 Emphysema, unspecified: Secondary | ICD-10-CM | POA: Insufficient documentation

## 2017-12-02 NOTE — Progress Notes (Signed)
Daily Session Note  Patient Details  Name: Lori Day MRN: [id number] Date of Birth: 08/20/1946 Referring Provider:     Pulmonary Rehab Walk Test from 09/30/2017 in [facility]  Referring Provider  Dr. Nadel      Encounter Date: 12/02/2017  Check In: Session Check In - 12/02/17 1217      Check-In   Location  [facility]    Staff Present  Joan Behrens, RN, BSN;Molly diVincenzo, MS, ACSM RCEP, Exercise Physiologist;Portia Payne, RN, BSN;Lisa Hughes, RN    Supervising physician immediately available to respond to emergencies  [facility] immediately available    Physician(s)  Dr. Regalado    Medication changes reported      No    Fall or balance concerns reported     No    Tobacco Cessation  No Change    Warm-up and Cool-down  Performed as group-led instruction    Resistance Training Performed  Yes    VAD Patient?  No      Pain Assessment   Currently in Pain?  No/denies    Multiple Pain Sites  No       Capillary Blood Glucose: No results found for this or any previous visit (from the past 24 hour(s)).  Exercise Prescription Changes - 12/02/17 1200      Response to Exercise   Blood Pressure (Admit)  126/64    Blood Pressure (Exercise)  136/70    Blood Pressure (Exit)  100/50    Heart Rate (Admit)  70 bpm    Heart Rate (Exercise)  102 bpm    Heart Rate (Exit)  80 bpm    Oxygen Saturation (Admit)  94 %    Oxygen Saturation (Exercise)  90 %    Oxygen Saturation (Exit)  93 %    Rating of Perceived Exertion (Exercise)  11    Perceived Dyspnea (Exercise)  1    Duration  Progress to 45 minutes of aerobic exercise without signs/symptoms of physical distress    Intensity  THRR unchanged      Progression   Progression  Continue to progress workloads to maintain intensity without signs/symptoms of physical distress.      Resistance Training   Training Prescription  Yes    Weight  blue bands    Reps  10-15    Time  10  Minutes      Recumbant Bike   Level  4    Minutes  17      NuStep   Level  5    Minutes  17    METs  2.8      Track   Laps  19    Minutes  17       Social History   Tobacco Use  Smoking Status Current Every Day Smoker  . Packs/day: 1.00  . Years: 50.00  . Pack years: 50.00  . Types: Cigarettes  . Last attempt to quit: 02/25/2017  . Years since quitting: 0.7  Smokeless Tobacco Never Used    Goals Met:  Exercise tolerated well No report of cardiac concerns or symptoms Strength training completed today  Goals Unmet:  Not Applicable  Comments: Service time is from 1030 to 1205    Dr. Wesam G. Yacoub is Medical Director for Pulmonary Rehab at [facility]. 

## 2017-12-04 ENCOUNTER — Encounter (HOSPITAL_COMMUNITY)
Admission: RE | Admit: 2017-12-04 | Discharge: 2017-12-04 | Disposition: A | Discharge status: Home / Self Care | Payer: Medicare Other | Source: Ambulatory Visit | Attending: Pulmonary Disease | Admitting: Pulmonary Disease

## 2017-12-04 VITALS — Wt 196.7 lb

## 2017-12-04 DIAGNOSIS — J439 Emphysema, unspecified: Secondary | ICD-10-CM | POA: Diagnosis not present

## 2017-12-04 NOTE — Progress Notes (Signed)
Daily Session Note  Patient Details  Name: Lori Day MRN: [id number] Date of Birth: 06-May-1946 Referring Provider:     Pulmonary Rehab Walk Test from 09/30/2017 in [place]  Referring Provider  Dr. Lenna Gilford      Encounter Date: 12/04/2017  Check In: Session Check In - 12/04/17 1021      Check-In   Location  [facility]    Staff Present  Rosebud Poles, RN, BSN;Melonee Gerstel, MS, ACSM RCEP, Exercise Physiologist;Portia Rollene Rotunda, RN, Roque Cash, RN    Supervising physician immediately available to respond to emergencies  [facility] immediately available    Physician(s)  Dr. Denton Brick    Medication changes reported      No    Fall or balance concerns reported     No    Tobacco Cessation  No Change    Warm-up and Cool-down  Performed as group-led instruction    Resistance Training Performed  Yes    VAD Patient?  No      Pain Assessment   Currently in Pain?  No/denies    Multiple Pain Sites  No       Capillary Blood Glucose: No results found for this or any previous visit (from the past 24 hour(s)).    Social History   Tobacco Use  Smoking Status Current Every Day Smoker  . Packs/day: 1.00  . Years: 50.00  . Pack years: 50.00  . Types: Cigarettes  . Last attempt to quit: 02/25/2017  . Years since quitting: 0.7  Smokeless Tobacco Never Used    Goals Met:  Exercise tolerated well No report of cardiac concerns or symptoms Strength training completed today  Goals Unmet:  Not Applicable  Comments: Service time is from 10:30a to 12:25p    Dr. Rush Farmer is Medical Director for Pulmonary Rehab at [facility].

## 2017-12-09 ENCOUNTER — Encounter (HOSPITAL_COMMUNITY)
Admission: RE | Admit: 2017-12-09 | Discharge: 2017-12-09 | Disposition: A | Discharge status: Home / Self Care | Payer: Medicare Other | Source: Ambulatory Visit | Attending: Pulmonary Disease | Admitting: Pulmonary Disease

## 2017-12-09 DIAGNOSIS — J439 Emphysema, unspecified: Secondary | ICD-10-CM | POA: Diagnosis not present

## 2017-12-09 NOTE — Progress Notes (Signed)
Daily Session Note  Patient Details  Name: Lori Day MRN: [id number] Date of Birth: 09/18/1946 Referring Provider:     Pulmonary Rehab Walk Test from 09/30/2017 in [place]  Referring Provider  Dr. Lenna Gilford      Encounter Date: 12/09/2017  Check In: Session Check In - 12/09/17 1218      Check-In   Location  [facility]    Staff Present  Rosebud Poles, RN, BSN;Haydn Cush, MS, ACSM RCEP, Exercise Physiologist;Portia Rollene Rotunda, RN, Roque Cash, RN    Supervising physician immediately available to respond to emergencies  [facility] immediately available    Physician(s)  Dr. Lonny Prude    Medication changes reported      No    Fall or balance concerns reported     No    Tobacco Cessation  No Change    Warm-up and Cool-down  Performed as group-led instruction    Resistance Training Performed  Yes    VAD Patient?  No      Pain Assessment   Currently in Pain?  No/denies    Multiple Pain Sites  No       Capillary Blood Glucose: No results found for this or any previous visit (from the past 24 hour(s)).    Social History   Tobacco Use  Smoking Status Current Every Day Smoker  . Packs/day: 1.00  . Years: 50.00  . Pack years: 50.00  . Types: Cigarettes  . Last attempt to quit: 02/25/2017  . Years since quitting: 0.7  Smokeless Tobacco Never Used    Goals Met:  Exercise tolerated well No report of cardiac concerns or symptoms Strength training completed today  Goals Unmet:  Not Applicable  Comments: Service time is from 10:30a to 12:15p    Dr. Rush Farmer is Medical Director for Pulmonary Rehab at [facility].

## 2017-12-09 NOTE — Progress Notes (Signed)
Pulmonary Individual Treatment Plan  Patient Details  Name: Lori Day MRN: [id number] Date of Birth: [birth year] Referring Provider:     Pulmonary Rehab Walk Test from 09/30/2017 in [place]  Referring Provider  Dr. Lenna Gilford      Initial Encounter Date:    Pulmonary Rehab Walk Test from 09/30/2017 in [facility]  Date  10/02/17  Referring Provider  Dr. Lenna Gilford      Visit Diagnosis: Pulmonary emphysema, unspecified emphysema type ([place])  Patient's Home Medications on Admission:   Current Outpatient Medications:  .  Albuterol Sulfate (PROAIR RESPICLICK) 993 (90 Base) MCG/ACT AEPB, Inhale 2 puffs every 6 (six) hours as needed into the lungs., Disp: 1 each, Rfl: 0 .  b complex vitamins tablet, Bid po OTC, Disp: , Rfl:  .  buPROPion (WELLBUTRIN XL) 150 MG 24 hr tablet, TAKE 1 TABLET BY MOUTH EVERY DAY IN THE MORNING, Disp: , Rfl: 1 .  calcium carbonate (CALCIUM 600) 600 MG TABS tablet, Take 600 mg by mouth daily with breakfast., Disp: , Rfl:  .  Calcium-Magnesium-Vitamin D (CALCIUM 1200+D3 PO), Take by mouth., Disp: , Rfl:  .  cetirizine (ZYRTEC) 10 MG tablet, Take 10 mg by mouth daily as needed for allergies., Disp: , Rfl:  .  Cholecalciferol (HM VITAMIN D3) 4000 units CAPS, Take 1 capsule by mouth daily., Disp: , Rfl:  .  FLUoxetine (PROZAC) 20 MG capsule, Take 20 mg by mouth., Disp: , Rfl:  .  fluticasone (FLONASE) 50 MCG/ACT nasal spray, Place 2 sprays into both nostrils daily., Disp: , Rfl:  .  GLUCOSA-CHONDR-NA CHONDR-MSM PO, Take by mouth daily. W/ vit d, Disp: , Rfl:  .  levothyroxine (SYNTHROID, LEVOTHROID) 75 MCG tablet, Take 1 tablet (75 mcg total) by mouth daily before breakfast., Disp: 30 tablet, Rfl: 6 .  omeprazole (PRILOSEC) 20 MG capsule, Take 20 mg by mouth every other day. , Disp: , Rfl:  .  ranitidine (ZANTAC) 150 MG tablet, Take 150 mg by mouth every other day., Disp: , Rfl:  .  Tiotropium  Bromide-Olodaterol (STIOLTO RESPIMAT) 2.5-2.5 MCG/ACT AERS, Inhale 2 puffs into the lungs daily., Disp: 3 Inhaler, Rfl: 4 .  TURMERIC PO, Take by mouth., Disp: , Rfl:  .  valACYclovir (VALTREX) 1000 MG tablet, Take 1,000 mg by mouth 2 (two) times daily., Disp: , Rfl:   Past Medical History: Past Medical History:  Diagnosis Date  . Arthritis   . Bipolar 1 disorder ([place])   . 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 She quit in 2013.   [place] Kitchen GERD (gastroesophageal reflux disease)   . Glaucoma   . Hiatal hernia   . History of HPV infection   . Hx of chronic sinusitis   . 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 Sciatica   . Sensory disturbance 09/19/2014  . Sensory hearing loss, bilateral   . Sjogren's syndrome ([place]) 09-02-2007  . 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 Thyroid nodule   . Tongue cancer ([place]) 02-02-2013   HPV/ radiation/chemo    Tobacco Use: Social History   Tobacco Use  Smoking Status Current Every Day Smoker  . Packs/day: 1.00  . Years: 50.00  . Pack years: 50.00  . Types: Cigarettes  . Last attempt to quit: 02/25/2017  . Years since quitting: 0.7  Smokeless Tobacco Never Used    Labs: Recent Review Scientist, physiological  There is no flowsheet data to display.      Capillary Blood Glucose: No results found for: GLUCAP   Pulmonary Assessment Scores: Pulmonary Assessment Scores    Row Name 10/02/17 0714         ADL UCSD   ADL Phase  Entry       mMRC Score   mMRC Score  1        Pulmonary Function Assessment: Pulmonary Function Assessment - 09/26/17 1118      Breath   Bilateral Breath Sounds  Clear    Shortness of Breath  Limiting activity uphill walking       Exercise Target Goals:    Exercise Program Goal: Individual exercise prescription set using results from initial 6 min walk test and THRR while considering  patient's activity barriers and safety.    Exercise Prescription  Goal: Initial exercise prescription builds to 30-45 minutes a day of aerobic activity, 2-3 days per week.  Home exercise guidelines will be given to patient during program as part of exercise prescription that the participant will acknowledge.  Activity Barriers & Risk Stratification: Activity Barriers & Cardiac Risk Stratification - 09/26/17 1050      Activity Barriers & Cardiac Risk Stratification   Activity Barriers  Shortness of Breath tendonitis right wrist       6 Minute Walk:   Oxygen Initial Assessment: Oxygen Initial Assessment - 10/02/17 0713      Initial 6 min Walk   Oxygen Used  None      Program Oxygen Prescription   Program Oxygen Prescription  Continuous    Liters per minute  2    Comments  Patient desaturated to 85% on her 6MWT. Will monitor during first day to see how she does but its predicted we will need to use atleast 2 liters       Oxygen Re-Evaluation: Oxygen Re-Evaluation    Row Name 10/23/17 0717 11/17/17 1515 11/17/17 1516 12/04/17 1626       Program Oxygen Prescription   Program Oxygen Prescription  Continuous  Continuous  (Pended)   None  None    Liters per minute  2  -  0  0    Comments  Patient desaturated to 85% on her 6MWT. Will monitor during first day to see how she does but its predicted we will need to use atleast 2 liters  -  -  Patient desaturated to 85% on her 6MWT. Will monitor during first day to see how she does but its predicted we will need to use atleast 2 liters      Home Oxygen   Home Oxygen Device  Home Concentrator  -  Home Concentrator  Home Concentrator    Sleep Oxygen Prescription  Continuous  -  Continuous  Continuous    Liters per minute  2  -  2  2    Home Exercise Oxygen Prescription  None  -  None  None    Home at Rest Exercise Oxygen Prescription  None  -  None  None    Compliance with Home Oxygen Use  Yes  -  Yes  Yes      Goals/Expected Outcomes   Short Term Goals  To learn and exhibit compliance with exercise,  home and travel O2 prescription;To learn and understand importance of monitoring SPO2 with pulse oximeter and demonstrate accurate use of the pulse oximeter.;To learn and understand importance of maintaining oxygen saturations>88%;To learn and demonstrate proper pursed lip breathing  techniques or other breathing techniques.;To learn and demonstrate proper use of respiratory medications  -  To learn and exhibit compliance with exercise, home and travel O2 prescription;To learn and understand importance of monitoring SPO2 with pulse oximeter and demonstrate accurate use of the pulse oximeter.;To learn and understand importance of maintaining oxygen saturations>88%;To learn and demonstrate proper pursed lip breathing techniques or other breathing techniques.;To learn and demonstrate proper use of respiratory medications  To learn and exhibit compliance with exercise, home and travel O2 prescription;To learn and understand importance of monitoring SPO2 with pulse oximeter and demonstrate accurate use of the pulse oximeter.;To learn and understand importance of maintaining oxygen saturations>88%;To learn and demonstrate proper pursed lip breathing techniques or other breathing techniques.;To learn and demonstrate proper use of respiratory medications    Long  Term Goals  Exhibits compliance with exercise, home and travel O2 prescription;Verbalizes importance of monitoring SPO2 with pulse oximeter and return demonstration;Maintenance of O2 saturations>88%;Exhibits proper breathing techniques, such as pursed lip breathing or other method taught during program session;Compliance with respiratory medication;Demonstrates proper use of MDI's  -  Exhibits compliance with exercise, home and travel O2 prescription;Verbalizes importance of monitoring SPO2 with pulse oximeter and return demonstration;Maintenance of O2 saturations>88%;Exhibits proper breathing techniques, such as pursed lip breathing or other method taught  during program session;Compliance with respiratory medication;Demonstrates proper use of MDI's  Exhibits compliance with exercise, home and travel O2 prescription;Verbalizes importance of monitoring SPO2 with pulse oximeter and return demonstration;Maintenance of O2 saturations>88%;Exhibits proper breathing techniques, such as pursed lip breathing or other method taught during program session;Compliance with respiratory medication;Demonstrates proper use of MDI's    Comments  -  -  patient exhibits compliance  patient exhibits compliance       Oxygen Discharge (Final Oxygen Re-Evaluation): Oxygen Re-Evaluation - 12/04/17 1626      Program Oxygen Prescription   Program Oxygen Prescription  None    Liters per minute  0    Comments  Patient desaturated to 85% on her 6MWT. Will monitor during first day to see how she does but its predicted we will need to use atleast 2 liters      Home Oxygen   Home Oxygen Device  Home Concentrator    Sleep Oxygen Prescription  Continuous    Liters per minute  2    Home Exercise Oxygen Prescription  None    Home at Rest Exercise Oxygen Prescription  None    Compliance with Home Oxygen Use  Yes      Goals/Expected Outcomes   Short Term Goals  To learn and exhibit compliance with exercise, home and travel O2 prescription;To learn and understand importance of monitoring SPO2 with pulse oximeter and demonstrate accurate use of the pulse oximeter.;To learn and understand importance of maintaining oxygen saturations>88%;To learn and demonstrate proper pursed lip breathing techniques or other breathing techniques.;To learn and demonstrate proper use of respiratory medications    Long  Term Goals  Exhibits compliance with exercise, home and travel O2 prescription;Verbalizes importance of monitoring SPO2 with pulse oximeter and return demonstration;Maintenance of O2 saturations>88%;Exhibits proper breathing techniques, such as pursed lip breathing or other method taught  during program session;Compliance with respiratory medication;Demonstrates proper use of MDI's    Comments  patient exhibits compliance       Initial Exercise Prescription: Initial Exercise Prescription - 10/02/17 0700      Date of Initial Exercise RX and Referring Provider   Date  10/02/17    Referring Provider  Dr. Lenna Gilford  Oxygen   Oxygen  Continuous    Liters  2      Bike   Level  0.4    Minutes  17      NuStep   Level  2    SPM  80    Minutes  17    METs  1.4      Track   Laps  10    Minutes  17      Prescription Details   Frequency (times per week)  2    Duration  Progress to 45 minutes of aerobic exercise without signs/symptoms of physical distress      Intensity   THRR 40-80% of Max Heartrate  60-120    Ratings of Perceived Exertion  11-13    Perceived Dyspnea  0-4      Progression   Progression  Continue progressive overload as per policy without signs/symptoms or physical distress.      Resistance Training   Training Prescription  Yes    Weight  blue bands    Reps  10-15       Perform Capillary Blood Glucose checks as needed.  Exercise Prescription Changes: Exercise Prescription Changes    Row Name 10/09/17 1200 10/14/17 1500 10/16/17 1300 11/04/17 1400 11/06/17 1247     Response to Exercise   Blood Pressure (Admit)  104/62  122/66  110/60  116/60  118/70   Blood Pressure (Exercise)  150/70  142/56  134/60  120/62  130/78   Blood Pressure (Exit)  122/62  110/70  108/60  100/60  126/50   Heart Rate (Admit)  70 bpm  70 bpm  73 bpm  77 bpm  86 bpm   Heart Rate (Exercise)  99 bpm  103 bpm  108 bpm  103 bpm  111 bpm   Heart Rate (Exit)  81 bpm  76 bpm  84 bpm  87 bpm  94 bpm   Oxygen Saturation (Admit)  92 %  93 %  90 %  92 %  89 %   Oxygen Saturation (Exercise)  89 %  89 %  89 %  91 %  88 %   Oxygen Saturation (Exit)  91 %  95 %  91 %  91 %  92 %   Rating of Perceived Exertion (Exercise)  _0 Perceived Dyspnea (Exercise)  1  0   _1 Duration  Progress to 45 minutes of aerobic exercise without signs/symptoms of physical distress  Progress to 45 minutes of aerobic exercise without signs/symptoms of physical distress  Progress to 45 minutes of aerobic exercise without signs/symptoms of physical distress  Progress to 45 minutes of aerobic exercise without signs/symptoms of physical distress  Progress to 45 minutes of aerobic exercise without signs/symptoms of physical distress   Intensity  Other (comment) 40-80% HRR  THRR unchanged  THRR unchanged  THRR unchanged  THRR unchanged     Progression   Progression  Continue to progress workloads to maintain intensity without signs/symptoms of physical distress.  Continue to progress workloads to maintain intensity without signs/symptoms of physical distress.  Continue to progress workloads to maintain intensity without signs/symptoms of physical distress.  Continue to progress workloads to maintain intensity without signs/symptoms of physical distress.  Continue to progress workloads to maintain intensity without signs/symptoms of physical distress.     Resistance Training   Training Prescription  Yes  Yes  Yes  Yes  Yes   Weight  blue bands  blue bands  blue bands  blue bands  blue bands   Reps  10-15  10-15  10-15  10-15  10-15   Time  -  10 Minutes  10 Minutes  10 Minutes  10 Minutes     Bike   Level  - carpel tunnel was bothered by AD bike, switched to recumbent  -  -  -  -     Recumbant Bike   Level  _0 -   Minutes  _1 -     NuStep   Level  2  2  -  3  3   Minutes  17  17  -  17  17   METs  2  2.1  -  2  2.1     Track   Laps  _2 Minutes  _3 Row Name 11/11/17 1200 11/13/17 1200 11/18/17 1200 11/20/17 1231 11/25/17 1200     Response to Exercise   Blood Pressure (Admit)  106/54  100/56  100/64  122/70  114/70   Blood Pressure (Exercise)  130/70  124/50  142/70  158/80  150/60   Blood Pressure (Exit)   112/60  102/60  94/64  106/58  100/56   Heart Rate (Admit)  80 bpm  76 bpm  72 bpm  74 bpm  77 bpm   Heart Rate (Exercise)  104 bpm  105 bpm  119 bpm  116 bpm  115 bpm   Heart Rate (Exit)  94 bpm  83 bpm  88 bpm  72 bpm  88 bpm   Oxygen Saturation (Admit)  91 %  94 %  96 %  95 %  94 %   Oxygen Saturation (Exercise)  90 %  91 %  91 %  87 %  88 %   Oxygen Saturation (Exit)  92 %  93 %  92 %  95 %  93 %   Rating of Perceived Exertion (Exercise)  _4 Perceived Dyspnea (Exercise)  1  0  1  0  1   Duration  Progress to 45 minutes of aerobic exercise without signs/symptoms of physical distress  Progress to 45 minutes of aerobic exercise without signs/symptoms of physical distress  Progress to 45 minutes of aerobic exercise without signs/symptoms of physical distress  Progress to 45 minutes of aerobic exercise without signs/symptoms of physical distress  Progress to 45 minutes of aerobic exercise without signs/symptoms of physical distress   Intensity  THRR unchanged  THRR unchanged  THRR unchanged  THRR unchanged  THRR unchanged     Progression   Progression  Continue to progress workloads to maintain intensity without signs/symptoms of physical distress.  Continue to progress workloads to maintain intensity without signs/symptoms of physical distress.  Continue to progress workloads to maintain intensity without signs/symptoms of physical distress.  Continue to progress workloads to maintain intensity without signs/symptoms of physical distress.  Continue to progress workloads to maintain intensity without signs/symptoms of physical distress.     Resistance Training   Training Prescription  Yes  Yes  Yes  Yes  Yes   Weight  blue bands  blue bands  blue bands  blue  bands  blue bands   Reps  10-15  10-15  10-15  10-15  10-15   Time  10 Minutes  10 Minutes  10 Minutes  10 Minutes  10 Minutes     Treadmill   MPH  -  -  2.8  -  -   Grade  -  -  0  -  -   Minutes  -  -  17  -  -      Recumbant Bike   Level  _0 Minutes  _1 NuStep   Level  _2 -  4   Minutes  _3 -  17   METs  2.6  2.5  2.3  -  2.6     Track   Laps  21  -  -  21  20   Minutes  17  -  -  17  17     Home Exercise Plan   Plans to continue exercise at  -  Home (comment)  Home (comment)  Home (comment)  -   Frequency  -  Add 3 additional days to program exercise sessions.  Add 3 additional days to program exercise sessions.  Add 3 additional days to program exercise sessions.  -   Row Name 12/02/17 1200             Response to Exercise   Blood Pressure (Admit)  126/64       Blood Pressure (Exercise)  136/70       Blood Pressure (Exit)  100/50       Heart Rate (Admit)  70 bpm       Heart Rate (Exercise)  102 bpm       Heart Rate (Exit)  80 bpm       Oxygen Saturation (Admit)  94 %       Oxygen Saturation (Exercise)  90 %       Oxygen Saturation (Exit)  93 %       Rating of Perceived Exertion (Exercise)  11       Perceived Dyspnea (Exercise)  1       Duration  Progress to 45 minutes of aerobic exercise without signs/symptoms of physical distress       Intensity  THRR unchanged         Progression   Progression  Continue to progress workloads to maintain intensity without signs/symptoms of physical distress.         Resistance Training   Training Prescription  Yes       Weight  blue bands       Reps  10-15       Time  10 Minutes         Recumbant Bike   Level  4       Minutes  17         NuStep   Level  5       Minutes  17       METs  2.8         Track   Laps  19       Minutes  17          Exercise Comments: Exercise Comments    Row Name 11/13/17 1257  Exercise Comments  Home exercise completed          Exercise Goals and Review: Exercise Goals    Row Name 09/26/17 1051             Exercise Goals   Increase Physical Activity  Yes       Intervention  Provide advice, education, support and counseling about  physical activity/exercise needs.;Develop an individualized exercise prescription for aerobic and resistive training based on initial evaluation findings, risk stratification, comorbidities and participant's personal goals.       Expected Outcomes  Achievement of increased cardiorespiratory fitness and enhanced flexibility, muscular endurance and strength shown through measurements of functional capacity and personal statement of participant.       Increase Strength and Stamina  Yes       Intervention  Provide advice, education, support and counseling about physical activity/exercise needs.;Develop an individualized exercise prescription for aerobic and resistive training based on initial evaluation findings, risk stratification, comorbidities and participant's personal goals.       Expected Outcomes  Achievement of increased cardiorespiratory fitness and enhanced flexibility, muscular endurance and strength shown through measurements of functional capacity and personal statement of participant.       Able to understand and use rate of perceived exertion (RPE) scale  Yes       Intervention  Provide education and explanation on how to use RPE scale       Expected Outcomes  Short Term: Able to use RPE daily in rehab to express subjective intensity level;Long Term:  Able to use RPE to guide intensity level when exercising independently       Able to understand and use Dyspnea scale  Yes       Intervention  Provide education and explanation on how to use Dyspnea scale       Expected Outcomes  Short Term: Able to use Dyspnea scale daily in rehab to express subjective sense of shortness of breath during exertion;Long Term: Able to use Dyspnea scale to guide intensity level when exercising independently       Knowledge and understanding of Target Heart Rate Range (THRR)  Yes       Intervention  Provide education and explanation of THRR including how the numbers were predicted and where they are located for  reference       Expected Outcomes  Short Term: Able to state/look up THRR       Understanding of Exercise Prescription  Yes       Intervention  Provide education, explanation, and written materials on patient's individual exercise prescription       Expected Outcomes  Short Term: Able to explain program exercise prescription;Long Term: Able to explain home exercise prescription to exercise independently          Exercise Goals Re-Evaluation : Exercise Goals Re-Evaluation    Row Name 10/16/17 1779 11/17/17 0747 12/04/17 1627         Exercise Goal Re-Evaluation   Exercise Goals Review  Increase Strength and Stamina;Increase Physical Activity;Able to understand and use Dyspnea scale;Able to understand and use rate of perceived exertion (RPE) scale;Knowledge and understanding of Target Heart Rate Range (THRR);Understanding of Exercise Prescription  Increase Strength and Stamina;Increase Physical Activity;Able to understand and use Dyspnea scale;Able to understand and use rate of perceived exertion (RPE) scale;Knowledge and understanding of Target Heart Rate Range (THRR);Understanding of Exercise Prescription  Increase Strength and Stamina;Increase Physical Activity;Able to understand and use Dyspnea scale;Able to understand and use rate  of perceived exertion (RPE) scale;Knowledge and understanding of Target Heart Rate Range (THRR);Understanding of Exercise Prescription     Comments  Patient has only attended 2 sessions. Will cont. to monitor and progress as able.  Patient is progressing well in the program. She is still smoking which we have discussed. Home exercise has been completed. Large emphasis on pursed lip breathing with patient since she does tend to waver in the low 90's and high 80's for oxygen saturation. Patient is able to walk 21 laps (200 ft each) in 15 minutes.   Patient is still smoking. She has purchased a fitbit to monitor her steps and aerobic activity. She is exhibiting more  initiative to make lifestyle changes. Is able to walk up to 22 laps (200 ft each) in 15 minutes.      Expected Outcomes  Through exercise at rehab and at home, patient will increase strength and stamina making ADL's easier to perform. Patient will also have a better understanding of safe exercise and what they are capable to do outside of clinical supervision.  Through exercise at rehab and at home, patient will increase strength and stamina and be less short of breath with ADL's at home.   Through exercise at rehab and at home, patient will increase strength and stamina and be less short of breath with ADL's at home.         Discharge Exercise Prescription (Final Exercise Prescription Changes): Exercise Prescription Changes - 12/02/17 1200      Response to Exercise   Blood Pressure (Admit)  126/64    Blood Pressure (Exercise)  136/70    Blood Pressure (Exit)  100/50    Heart Rate (Admit)  70 bpm    Heart Rate (Exercise)  102 bpm    Heart Rate (Exit)  80 bpm    Oxygen Saturation (Admit)  94 %    Oxygen Saturation (Exercise)  90 %    Oxygen Saturation (Exit)  93 %    Rating of Perceived Exertion (Exercise)  11    Perceived Dyspnea (Exercise)  1    Duration  Progress to 45 minutes of aerobic exercise without signs/symptoms of physical distress    Intensity  THRR unchanged      Progression   Progression  Continue to progress workloads to maintain intensity without signs/symptoms of physical distress.      Resistance Training   Training Prescription  Yes    Weight  blue bands    Reps  10-15    Time  10 Minutes      Recumbant Bike   Level  4    Minutes  17      NuStep   Level  5    Minutes  17    METs  2.8      Track   Laps  19    Minutes  17       Nutrition:  Target Goals: Understanding of nutrition guidelines, daily intake of sodium <1539m, cholesterol <2063m calories 30% from fat and 7% or less from saturated fats, daily to have 5 or more servings of fruits and  vegetables.  Biometrics: Pre Biometrics - 09/26/17 1053      Pre Biometrics   Grip Strength  32 kg        Nutrition Therapy Plan and Nutrition Goals: Nutrition Therapy & Goals - 10/10/17 1426      Nutrition Therapy   Diet  General, Healthful      Personal Nutrition Goals  Nutrition Goal  The pt will recognize symptoms that can interfere with adequate oral intake, such as shortness of breath, N/V, early satiety, fatigue, ability to secure and prepare food, taste and smell changes, chewing/swallowing difficulties, and/ or pain when eating.    Personal Goal #2  Identify food quantities necessary to achieve wt loss of  -2# per week to a goal wt loss of 6-20 lb at graduation from pulmonary rehab.      Intervention Plan   Intervention  Prescribe, educate and counsel regarding individualized specific dietary modifications aiming towards targeted core components such as weight, hypertension, lipid management, diabetes, heart failure and other comorbidities.    Expected Outcomes  Short Term Goal: Understand basic principles of dietary content, such as calories, fat, sodium, cholesterol and nutrients.;Long Term Goal: Adherence to prescribed nutrition plan.       Nutrition Assessments: Nutrition Assessments - 10/13/17 1521      Rate Your Plate Scores   Pre Score  45       Nutrition Goals Re-Evaluation:   Nutrition Goals Discharge (Final Nutrition Goals Re-Evaluation):   Psychosocial: Target Goals: Acknowledge presence or absence of significant depression and/or stress, maximize coping skills, provide positive support system. Participant is able to verbalize types and ability to use techniques and skills needed for reducing stress and depression.  Initial Review & Psychosocial Screening: Initial Psych Review & Screening - 09/26/17 1121      Initial Review   Current issues with  Current Depression;Current Stress Concerns only related to current smoking    Comments  related to  smoking and unability to remain cigarette free      [place]?  Yes    Concerns  No support system    Comments  as it related to smoking. she cannot tell her family that she still smokes and it bothers her to the point she smokes and crys at the same time.      Barriers   Psychosocial barriers to participate in program  The patient should benefit from training in stress management and relaxation.      Screening Interventions   Interventions  Encouraged to exercise;To provide support and resources with identified psychosocial needs       Quality of Life Scores:  Scores of 19 and below usually indicate a poorer quality of life in these areas.  A difference of  2-3 points is a clinically meaningful difference.  A difference of 2-3 points in the total score of the Quality of Life Index has been associated with significant improvement in overall quality of life, self-image, physical symptoms, and general health in studies assessing change in quality of life.   PHQ-9: Recent Review Flowsheet Data    Depression screen [facility] 2/9 09/26/2017 09/17/2016   Decreased Interest 0 0   Down, Depressed, Hopeless 3  0   PHQ - 2 Score 3 0   Altered sleeping 0 -   Tired, decreased energy 0 -   Feeling bad or failure about yourself  3  -   Trouble concentrating 0 -   Moving slowly or fidgety/restless 0 -   Suicidal thoughts 0 -   PHQ-9 Score 6 -   Difficult doing work/chores Not difficult at all -     Interpretation of Total Score  Total Score Depression Severity:  1-4 = Minimal depression, 5-9 = Mild depression, 10-14 = Moderate depression, 15-19 = Moderately severe depression, 20-27 = Severe depression   Psychosocial Evaluation and  Intervention: Psychosocial Evaluation - 09/26/17 1127      Psychosocial Evaluation & Interventions   Interventions  Encouraged to exercise with the program and follow exercise prescription    Continue Psychosocial Services   Follow up  required by staff       Psychosocial Re-Evaluation: Psychosocial Re-Evaluation    [place] Name 10/23/17 0721 11/17/17 1204 12/09/17 0700         Psychosocial Re-Evaluation   Current issues with  Current Depression;Current Stress Concerns  Current Depression;Current Stress Concerns  Current Depression;Current Stress Concerns     Comments  related to smoking  her stress and depression is related to the fact that she has failed so many attempts to quit smoking.  her stress and depression is related to the fact that she has failed so many attempts to quit smoking. should see an improvement if she remains cigarette free     Expected Outcomes  patient will remain free from psychosocial barriers to participation in pulmonary rehab.  patient will remain free from psychosocial barriers to participation in pulmonary rehab.  patient will remain free from psychosocial barriers to participation in pulmonary rehab.     Interventions  -  Encouraged to attend Pulmonary Rehabilitation for the exercise  Encouraged to attend Pulmonary Rehabilitation for the exercise     Continue Psychosocial Services   Follow up required by staff  Follow up required by staff  Follow up required by staff     Comments  related to smoking and unability to remain cigarette free  -  related to smoking and unability to remain cigarette free        Psychosocial Discharge (Final Psychosocial Re-Evaluation): Psychosocial Re-Evaluation - 12/09/17 0700      Psychosocial Re-Evaluation   Current issues with  Current Depression;Current Stress Concerns    Comments  her stress and depression is related to the fact that she has failed so many attempts to quit smoking. should see an improvement if she remains cigarette free    Expected Outcomes  patient will remain free from psychosocial barriers to participation in pulmonary rehab.    Interventions  Encouraged to attend Pulmonary Rehabilitation for the exercise    Continue Psychosocial Services    Follow up required by staff    Comments  related to smoking and unability to remain cigarette free       Education: Education Goals: Education classes will be provided on a weekly basis, covering required topics. Participant will state understanding/return demonstration of topics presented.  Learning Barriers/Preferences: Learning Barriers/Preferences - 09/26/17 1118      Learning Barriers/Preferences   Learning Barriers  None    Learning Preferences  Computer/Internet;Individual Instruction;Verbal Instruction;Written Material;Video;Skilled Demonstration       Education Topics: Risk Factor Reduction:  -Group instruction that is supported by a PowerPoint presentation. Instructor discusses the definition of a risk factor, different risk factors for pulmonary disease, and how the heart and lungs work together.     Nutrition for Pulmonary Patient:  -Group instruction provided by PowerPoint slides, verbal discussion, and written materials to support subject matter. The instructor gives an explanation and review of healthy diet recommendations, which includes a discussion on weight management, recommendations for fruit and vegetable consumption, as well as protein, fluid, caffeine, fiber, sodium, sugar, and alcohol. Tips for eating when patients are short of breath are discussed.   Pursed Lip Breathing:  -Group instruction that is supported by demonstration and informational handouts. Instructor discusses the benefits of pursed lip and diaphragmatic  breathing and detailed demonstration on how to preform both.     Oxygen Safety:  -Group instruction provided by PowerPoint, verbal discussion, and written material to support subject matter. There is an overview of "What is Oxygen" and "Why do we need it".  Instructor also reviews how to create a safe environment for oxygen use, the importance of using oxygen as prescribed, and the risks of noncompliance. There is a brief discussion on  traveling with oxygen and resources the patient may utilize.   PULMONARY REHAB OTHER RESPIRATORY from 12/04/2017 in [place]  Date  12/04/17  Educator  Truddie Crumble  Instruction Review Code  2- Demonstrated Understanding      Oxygen Equipment:  -Group instruction provided by [facility] Staff utilizing handouts, written materials, and equipment demonstrations.   Signs and Symptoms:  -Group instruction provided by written material and verbal discussion to support subject matter. Warning signs and symptoms of infection, stroke, and heart attack are reviewed and when to call the physician/911 reinforced. Tips for preventing the spread of infection discussed.   Advanced Directives:  -Group instruction provided by verbal instruction and written material to support subject matter. Instructor reviews Advanced Directive laws and proper instruction for filling out document.   Pulmonary Video:  -Group video education that reviews the importance of medication and oxygen compliance, exercise, good nutrition, pulmonary hygiene, and pursed lip and diaphragmatic breathing for the pulmonary patient.   Exercise for the Pulmonary Patient:  -Group instruction that is supported by a PowerPoint presentation. Instructor discusses benefits of exercise, core components of exercise, frequency, duration, and intensity of an exercise routine, importance of utilizing pulse oximetry during exercise, safety while exercising, and options of places to exercise outside of rehab.     Pulmonary Medications:  -Verbally interactive group education provided by instructor with focus on inhaled medications and proper administration.   PULMONARY REHAB OTHER RESPIRATORY from 12/04/2017 in [place]  Date  11/13/17  Educator  pharm      Anatomy and Physiology of the Respiratory System and Intimacy:  -Group instruction provided by PowerPoint, verbal discussion, and  written material to support subject matter. Instructor reviews respiratory cycle and anatomical components of the respiratory system and their functions. Instructor also reviews differences in obstructive and restrictive respiratory diseases with examples of each. Intimacy, Sex, and Sexuality differences are reviewed with a discussion on how relationships can change when diagnosed with pulmonary disease. Common sexual concerns are reviewed.   PULMONARY REHAB OTHER RESPIRATORY from 12/04/2017 in [place]  Date  10/16/17  Educator  RN      MD DAY -A group question and answer session with a medical doctor that allows participants to ask questions that relate to their pulmonary disease state.   PULMONARY REHAB OTHER RESPIRATORY from 12/04/2017 in [place]  Date  11/06/17  Educator  yacoub      OTHER EDUCATION -Group or individual verbal, written, or video instructions that support the educational goals of the pulmonary rehab program.   PULMONARY REHAB OTHER RESPIRATORY from 12/04/2017 in [place]  Date  11/20/17  Educator  EP  Instruction Review Code  1- Verbalizes Understanding      Holiday Eating Survival Tips:  -Group instruction provided by PowerPoint slides, verbal discussion, and written materials to support subject matter. The instructor gives patients tips, tricks, and techniques to help them not only survive but enjoy  the holidays despite the onslaught of food that accompanies the holidays.   Knowledge Questionnaire Score: Knowledge Questionnaire Score - 09/26/17 1118      Knowledge Questionnaire Score   Pre Score  12/13       Core Components/Risk Factors/Patient Goals at Admission: Personal Goals and Risk Factors at Admission - 09/26/17 1119      Core Components/Risk Factors/Patient Goals on Admission   Tobacco Cessation  Yes    Number of packs per day  1 pack per 2 days.  given "fake" cigarette, quit smart handout, started welbutrin Monday, awaiting for therapist appointment from primary MD.  she has successfully quit smoking multiple times and remained cigarette free for up to 6 months at a time.    Intervention  Assist the participant in steps to quit. Provide individualized education and counseling about committing to Tobacco Cessation, relapse prevention, and pharmacological support that can be provided by physician.;Advice worker, assist with locating and accessing local/national Quit Smoking programs, and support quit date choice.    Expected Outcomes  Short Term: Will demonstrate readiness to quit, by selecting a quit date.;Short Term: Will quit all tobacco product use, adhering to prevention of relapse plan.;Long Term: Complete abstinence from all tobacco products for at least 12 months from quit date.    Improve shortness of breath with ADL's  Yes    Intervention  Provide education, individualized exercise plan and daily activity instruction to help decrease symptoms of SOB with activities of daily living.    Expected Outcomes  Short Term: Achieves a reduction of symptoms when performing activities of daily living.    Develop more efficient breathing techniques such as purse lipped breathing and diaphragmatic breathing; and practicing self-pacing with activity  Yes    Intervention  Provide education, demonstration and support about specific breathing techniuqes utilized for more efficient breathing. Include techniques such as pursed lipped breathing, diaphragmatic breathing and self-pacing activity.    Expected Outcomes  Short Term: Participant will be able to demonstrate and use breathing techniques as needed throughout daily activities.    Increase knowledge of respiratory medications and ability to use respiratory devices properly   Yes    Intervention  Provide education and demonstration as needed of appropriate use of medications, inhalers, and  oxygen therapy.    Expected Outcomes  Short Term: Achieves understanding of medications use. Understands that oxygen is a medication prescribed by physician. Demonstrates appropriate use of inhaler and oxygen therapy.       Core Components/Risk Factors/Patient Goals Review:  Goals and Risk Factor Review    [place] Name 10/23/17 0718 11/17/17 1159 12/09/17 0658         Core Components/Risk Factors/Patient Goals Review   Personal Goals Review  Increase knowledge of respiratory medications and ability to use respiratory devices properly.;Improve shortness of breath with ADL's;Develop more efficient breathing techniques such as purse lipped breathing and diaphragmatic breathing and practicing self-pacing with activity.;Tobacco Cessation  Increase knowledge of respiratory medications and ability to use respiratory devices properly.;Improve shortness of breath with ADL's;Develop more efficient breathing techniques such as purse lipped breathing and diaphragmatic breathing and practicing self-pacing with activity.;Tobacco Cessation  Increase knowledge of respiratory medications and ability to use respiratory devices properly.;Improve shortness of breath with ADL's;Develop more efficient breathing techniques such as purse lipped breathing and diaphragmatic breathing and practicing self-pacing with activity.;Tobacco Cessation     Review  Patient is already tolerating workload increases in pulmonary rehab. She continues to smoke but is contemplating speaking with  her PCP about medications to help her stop. She states she still has her quit smart information. It is too soon in the program to evaluate progression towards other goals. Expect to see progress over the next 30 days.   Patient is progressing well in the program. She continues to tolerate workload increases and always pushes herself on the track for an RPE of 11-13. She has "cut back" on her cigarette smoking although she has not contacted the [facility]  program. She is observed utilizing PLB for her SOB with exertion. She recently attended an education session with the pharmacist on pulmonary medications including inhaled medications.  continues to progress very well in pulmonary rehab. At her last session patient stated she had smoked her last cigarette and had no plans to buy any more. Will follow up with her today. she continues to use PLB and states it has been the most effective tool that has helped with her shortness of breath.     Expected Outcomes  see "admission expected outcomes"  see "admission expected outcomes"  see "admission expected outcomes"        Core Components/Risk Factors/Patient Goals at Discharge (Final Review):  Goals and Risk Factor Review - 12/09/17 0658      Core Components/Risk Factors/Patient Goals Review   Personal Goals Review  Increase knowledge of respiratory medications and ability to use respiratory devices properly.;Improve shortness of breath with ADL's;Develop more efficient breathing techniques such as purse lipped breathing and diaphragmatic breathing and practicing self-pacing with activity.;Tobacco Cessation    Review  continues to progress very well in pulmonary rehab. At her last session patient stated she had smoked her last cigarette and had no plans to buy any more. Will follow up with her today. she continues to use PLB and states it has been the most effective tool that has helped with her shortness of breath.    Expected Outcomes  see "admission expected outcomes"       ITP Comments:   Comments: patient has attended 13 sessions since admission

## 2017-12-09 NOTE — Progress Notes (Signed)
Latika Kronick 72 y.o. female   DOB: 05-Jan-1946 MRN: [id number]          Nutrition 1. Pulmonary emphysema, unspecified emphysema type ([place])   Note Spoke with pt. Pt is overweight and wants to lose wt. Barriers to wt loss include pt does not have any bottom teeth.  Pt recently started eating breakfast when she started Pulmonary Rehab. Wt loss tips discussed. There are some ways the pt can make her eating habits healthier. Pt's Rate Your Plate results reviewed with pt. Pt expressed understanding of the information reviewed.  Nutrition Diagnosis ? Food-and nutrition-related knowledge deficit related to lack of exposure to information as related to diagnosis of pulmonary disease ? Overweight related to excessive energy intake as evidenced by a BMI of 29.7  Nutrition Intervention ? Pt's individual nutrition plan and goals reviewed with pt. ? Benefits of adopting healthy eating habits discussed when pt's Rate Your Plate reviewed. ? Handout for 5 day, 1500 kcal menu ideas given  Goal(s) 1. The pt will recognize symptoms that can interfere with adequate oral intake, such as shortness of breath, N/V, early satiety, fatigue, ability to secure and prepare food, taste and smell changes, chewing/swallowing difficulties, and/ or pain when eating. 2. Identify food quantities necessary to achieve wt loss of  -2# per week to a goal wt loss of 6-20 lb at graduation from pulmonary rehab.  Plan:  Pt to attend Pulmonary Nutrition class Will provide client-centered nutrition education as part of interdisciplinary care.   Monitor and evaluate progress toward nutrition goal with team.  Monitor and Evaluate progress toward nutrition goal with team.   Derek Mound, M.Ed, RD, LDN, CDE 12/09/2017 12:25 PM

## 2017-12-11 ENCOUNTER — Encounter (HOSPITAL_COMMUNITY)
Admission: RE | Admit: 2017-12-11 | Discharge: 2017-12-11 | Disposition: A | Discharge status: Home / Self Care | Payer: Medicare Other | Source: Ambulatory Visit | Attending: Pulmonary Disease | Admitting: Pulmonary Disease

## 2017-12-11 DIAGNOSIS — J439 Emphysema, unspecified: Secondary | ICD-10-CM | POA: Diagnosis not present

## 2017-12-11 NOTE — Progress Notes (Signed)
Daily Session Note  Patient Details  Name: Lori Day MRN: [id number] Date of Birth: 03-Dec-1945 Referring Provider:     Pulmonary Rehab Walk Test from 09/30/2017 in [place]  Referring Provider  Dr. Lenna Gilford      Encounter Date: 12/11/2017  Check In: Session Check In - 12/11/17 1030      Check-In   Location  [facility]    Staff Present  Su Hilt, MS, ACSM RCEP, Exercise Physiologist;Portia Rollene Rotunda, RN, BSN;Lisa Ysidro Evert, Felipe Drone, RN, [facility]    Supervising physician immediately available to respond to emergencies  [facility] immediately available    Physician(s)  Dr. Tyrell Antonio    Medication changes reported      No    Fall or balance concerns reported     No    Tobacco Cessation  No Change    Warm-up and Cool-down  Performed as group-led instruction    Resistance Training Performed  Yes    VAD Patient?  No      Pain Assessment   Currently in Pain?  No/denies    Multiple Pain Sites  No       Capillary Blood Glucose: No results found for this or any previous visit (from the past 24 hour(s)).    Social History   Tobacco Use  Smoking Status Current Every Day Smoker  . Packs/day: 1.00  . Years: 50.00  . Pack years: 50.00  . Types: Cigarettes  . Last attempt to quit: 02/25/2017  . Years since quitting: 0.7  Smokeless Tobacco Never Used    Goals Met:  Exercise tolerated well Queuing for purse lip breathing No report of cardiac concerns or symptoms  Goals Unmet:  Not Applicable  Comments: Service time is from 10:30a to 12:30p    Dr. Rush Farmer is Medical Director for Pulmonary Rehab at [facility].

## 2017-12-16 ENCOUNTER — Encounter (HOSPITAL_COMMUNITY)
Admission: RE | Admit: 2017-12-16 | Discharge: 2017-12-16 | Disposition: A | Discharge status: Home / Self Care | Payer: Medicare Other | Source: Ambulatory Visit | Attending: Pulmonary Disease | Admitting: Pulmonary Disease

## 2017-12-16 VITALS — Wt 198.4 lb

## 2017-12-16 DIAGNOSIS — J439 Emphysema, unspecified: Secondary | ICD-10-CM

## 2017-12-16 NOTE — Progress Notes (Signed)
Daily Session Note  Patient Details  Name: Lori Day MRN: [id number] Date of Birth: Mar 30, 1946 Referring Provider:     Pulmonary Rehab Walk Test from 09/30/2017 in [place]  Referring Provider  Dr. Lenna Gilford      Encounter Date: 12/16/2017  Check In: Session Check In - 12/16/17 1030      Check-In   Location  [facility]    Staff Present  Rosebud Poles, RN, Luisa Hart, RN, BSN;Molly diVincenzo, MS, ACSM RCEP, Exercise Physiologist;Treavon Castilleja Ysidro Evert, RN    Supervising physician immediately available to respond to emergencies  [facility] immediately available    Physician(s)  Dr. Tyrell Antonio    Medication changes reported      No    Fall or balance concerns reported     No    Tobacco Cessation  No Change    Warm-up and Cool-down  Performed as group-led instruction    Resistance Training Performed  Yes    VAD Patient?  No      Pain Assessment   Currently in Pain?  No/denies    Multiple Pain Sites  No       Capillary Blood Glucose: No results found for this or any previous visit (from the past 24 hour(s)).  Exercise Prescription Changes - 12/16/17 1200      Response to Exercise   Blood Pressure (Admit)  110/60    Blood Pressure (Exercise)  150/50    Blood Pressure (Exit)  112/68    Heart Rate (Admit)  71 bpm    Heart Rate (Exercise)  112 bpm    Heart Rate (Exit)  82 bpm    Oxygen Saturation (Admit)  94 %    Oxygen Saturation (Exercise)  91 %    Oxygen Saturation (Exit)  94 %    Rating of Perceived Exertion (Exercise)  12    Perceived Dyspnea (Exercise)  1    Duration  Progress to 45 minutes of aerobic exercise without signs/symptoms of physical distress    Intensity  THRR unchanged      Progression   Progression  Continue to progress workloads to maintain intensity without signs/symptoms of physical distress.      Resistance Training   Training Prescription  Yes    Weight  blue bands    Reps  10-15    Time   10 Minutes      Recumbant Bike   Level  4    Minutes  17      NuStep   Level  5    Minutes  17    METs  2.7      Track   Laps  22    Minutes  17       Social History   Tobacco Use  Smoking Status Current Every Day Smoker  . Packs/day: 1.00  . Years: 50.00  . Pack years: 50.00  . Types: Cigarettes  . Last attempt to quit: 02/25/2017  . Years since quitting: 0.8  Smokeless Tobacco Never Used    Goals Met:  Exercise tolerated well No report of cardiac concerns or symptoms Strength training completed today  Goals Unmet:  Not Applicable  Comments: Service time is from 1030 to 1200    Dr. Rush Farmer is Medical Director for Pulmonary Rehab at [facility].

## 2017-12-18 ENCOUNTER — Encounter (HOSPITAL_COMMUNITY)
Admission: RE | Admit: 2017-12-18 | Discharge: 2017-12-18 | Disposition: A | Discharge status: Home / Self Care | Payer: Medicare Other | Source: Ambulatory Visit | Attending: Pulmonary Disease | Admitting: Pulmonary Disease

## 2017-12-18 DIAGNOSIS — J439 Emphysema, unspecified: Secondary | ICD-10-CM

## 2017-12-18 NOTE — Progress Notes (Signed)
Daily Session Note  Patient Details  Name: Lori Day MRN: [id number] Date of Birth: 1946/07/21 Referring Provider:     Pulmonary Rehab Walk Test from 09/30/2017 in [place]  Referring Provider  Dr. Lenna Gilford      Encounter Date: 12/18/2017  Check In: Session Check In - 12/18/17 1030      Check-In   Location  [facility]    Staff Present  Rosebud Poles, RN, BSN;Molly diVincenzo, MS, ACSM RCEP, Exercise Physiologist;Lisa Ysidro Evert, RN;Littie Chiem Rollene Rotunda, RN, BSN    Supervising physician immediately available to respond to emergencies  [facility] immediately available    Physician(s)  Dr. Horris Latino    Medication changes reported      No    Fall or balance concerns reported     No    Tobacco Cessation  No Change    Warm-up and Cool-down  Performed as group-led instruction    Resistance Training Performed  Yes    VAD Patient?  No      Pain Assessment   Currently in Pain?  No/denies    Multiple Pain Sites  No       Capillary Blood Glucose: No results found for this or any previous visit (from the past 24 hour(s)).    Social History   Tobacco Use  Smoking Status Current Every Day Smoker  . Packs/day: 1.00  . Years: 50.00  . Pack years: 50.00  . Types: Cigarettes  . Last attempt to quit: 02/25/2017  . Years since quitting: 0.8  Smokeless Tobacco Never Used    Goals Met:  Independence with exercise equipment Improved SOB with ADL's Using PLB without cueing & demonstrates good technique Exercise tolerated well No report of cardiac concerns or symptoms Strength training completed today  Goals Unmet:  Not Applicable  Comments: Service time is from 1030 to 1200   Dr. Rush Farmer is Medical Director for Pulmonary Rehab at [facility].

## 2017-12-23 ENCOUNTER — Telehealth (HOSPITAL_COMMUNITY): Payer: Self-pay | Admitting: Family Medicine

## 2017-12-23 ENCOUNTER — Encounter (HOSPITAL_COMMUNITY): Payer: Medicare Other

## 2017-12-25 ENCOUNTER — Encounter (HOSPITAL_COMMUNITY)
Admission: RE | Admit: 2017-12-25 | Discharge: 2017-12-25 | Disposition: A | Discharge status: Home / Self Care | Payer: Medicare Other | Source: Ambulatory Visit | Attending: Pulmonary Disease | Admitting: Pulmonary Disease

## 2017-12-25 ENCOUNTER — Encounter: Payer: Self-pay | Admitting: Pulmonary Disease

## 2017-12-25 ENCOUNTER — Ambulatory Visit (INDEPENDENT_AMBULATORY_CARE_PROVIDER_SITE_OTHER): Payer: Medicare Other | Admitting: Pulmonary Disease

## 2017-12-25 VITALS — BP 128/62 | HR 68 | Temp 98.1°F | Ht 68.5 in | Wt 199.4 lb

## 2017-12-25 DIAGNOSIS — J439 Emphysema, unspecified: Secondary | ICD-10-CM

## 2017-12-25 DIAGNOSIS — F17210 Nicotine dependence, cigarettes, uncomplicated: Secondary | ICD-10-CM

## 2017-12-25 DIAGNOSIS — G4734 Idiopathic sleep related nonobstructive alveolar hypoventilation: Secondary | ICD-10-CM

## 2017-12-25 DIAGNOSIS — C01 Malignant neoplasm of base of tongue: Secondary | ICD-10-CM

## 2017-12-25 MED ORDER — RANITIDINE HCL 150 MG PO TABS: 150.0000 mg | ORAL_TABLET | ORAL | 0 refills | Status: DC

## 2017-12-25 NOTE — Progress Notes (Signed)
Daily Session Note  Patient Details  Name: Lori Day MRN: [id number] Date of Birth: 1946-09-09 Referring Provider:     Pulmonary Rehab Walk Test from 09/30/2017 in [place]  Referring Provider  Dr. Lenna Gilford      Encounter Date: 12/25/2017  Check In: Session Check In - 12/25/17 1030      Check-In   Location  [facility]    Staff Present  Rosebud Poles, RN, BSN;Molly diVincenzo, MS, ACSM RCEP, Exercise Physiologist;Lisa Ysidro Evert, RN;Kymari Nuon Rollene Rotunda, RN, BSN    Supervising physician immediately available to respond to emergencies  [facility] immediately available    Physician(s)  Dr. Eliseo Squires    Medication changes reported      No    Fall or balance concerns reported     No    Tobacco Cessation  No Change    Warm-up and Cool-down  Performed as group-led instruction    Resistance Training Performed  Yes    VAD Patient?  No      Pain Assessment   Currently in Pain?  No/denies    Multiple Pain Sites  No       Capillary Blood Glucose: No results found for this or any previous visit (from the past 24 hour(s)).    Social History   Tobacco Use  Smoking Status Former Smoker  . Packs/day: 1.00  . Years: 50.00  . Pack years: 50.00  . Types: Cigarettes  . Last attempt to quit: 11/28/2017  . Years since quitting: 0.0  Smokeless Tobacco Never Used    Goals Met:  Using PLB without cueing & demonstrates good technique Exercise tolerated well No report of cardiac concerns or symptoms Strength training completed today  Goals Unmet:  Not Applicable  Comments: Service time is from 1030 to 1225   Dr. Rush Farmer is Medical Director for Pulmonary Rehab at [facility].

## 2017-12-25 NOTE — Progress Notes (Signed)
Subjective:     Patient ID: Lori Day, female   DOB: 1946/05/27, 72 y.o.   MRN: [id number]  HPI  ~  April 18, 2015:  Initial visit w/ SN>  Her PCP is Animal nutritionist at [facility], [place]...      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       She relates a hx going back to 2009 in [place]- she had a sleep study- ?results, but she was started on CPAP; she subseq lost weight & improved so she stopped the CPAP on her own;  She was later re-tested and was said to have improved but she doesn't know the details;  Then in 2013 she had another abn sleep test & her physician restarted her CPAP (this was before her tongue cancer Dx);  She again stopped the CPAP in 2013 w/ the tongue cancer dx & treatment regimen of XRT & Chemo;  Then in 2015 she had a neuro eval by DrDohmeier (pt c/o pain & numbness in extremities, hx of pinched nerve, hx CTS, MRI neck w/ foraminal narrowing C5-C6) which included a home sleep study reported neg for OSA but she had nocturnal hypoxemia down to 70% sat she says and pt was referred to DrClance...       She saw DrClance 02/2015 & he reviewed her 08/2014 home sleep test- AHI=3.9 events/hr, lowest O2sat=82% (2hrs below 88%); she also has a hx COPD prev followed by a pulmonologist in [place]; still smoking as noted above; hx DOE w/ stairs and inclines or if rushing on level ground; denied much cough or sput; CXR showed COPD, NAD, and PFTs showed  mod airflow obstruction w/ decr DLCO (59%) and a reversible component;  He tried her on Stiolto- 2sp daily which she has taken over the last month- "about the same" she says (but still smoking)...      Current Meds>  Stiolto Respimat- 2 sprays once daily; Albuterol-HFA rescue inhaler prn, Chantix starter pack per her PCP, Zyrtek10/ Flonase Qhs...       EXAM reveals Afeb, VSS, O2sat=95% on RA at rest;  HEENT- denture, no lesions seen, mallampati2;  Chest- decr BS bilat w/o w/r/r;  Heart- RR w/o m/r/g;  Ext- neg w/o c/c/e...   CXR 03/15/15 showed norm heart size, COPD/emphysema, min atx/scarring at bases, NAD...  PFTs 03/16/15 showed FVC=3.26 (89%), FEV1=1.98 (71%), %1sec=61, mid-flows reduced at 43% predicted; after bronchodil FEV1 improved 16%; Lung volumes wnl; DLCO reduced at 59%... This is c/u mod airflow obstruction, emphysema, and a surprising reversible component...  ONO before considering nocturnal oxygen (Medicare doesn't accept oximetry from sleep studies)> pending=> O2sat<88% of 5h of the 7h study. IMP/PLAN>>  Lori Day has mod COPD/emphysema & a small reversible component; she needs to quit all smoking & use the Stiolto regularly, plus her prn rescue inhaler;  She has been found to have nocturnal hypoxemia but no  signif OSA at this time- we will proceed w/ her ONO to see if she needs nocturnal oxygen... ADDENDUM>>  ONO performed 06/15/15 w/ O2sat<88% for almost 5h of the total 7h study (there were 69 events or 9/hr;  She needs O2 at 2L/min Qhs and MUST QUIT ALL SMOKING!  ~  July 04, 2015:  2-48mo ROV w/ SN>  Lori Day reports that she is improved on the [employer] & hasn't needed her rescue inhaler in some time; she was able to quit smoking transiently w/ the help of Chantix but unfortunately she has restarted the smoking- now back to 1ppd; she understands the importance of smoking cessation in her overall health picture & recovery; we wrote for the Chantix maintenance packs...     Mod  COPD/emphysema w/ small reversible component> on Stiolto-2sp per day + AlbutHFA rescue inhaler; states she is improved on the Stiolto & hasn't needed the rescue inhaler recently; rec to continue the same...    Nocturnal hypoxemia> on O2 at 2L/min Qhs; several sleep tests w/o signif OSA...    Current cigarette smoker> she reports that Chantix helped her quit, was using e-cig, then went on a cruise & started cigarettes again- now back to 1ppd and "life is good" she says; we refilled the Chantix Rx for her...    Hx cancer at base of tongue> s/p XRT, chemoRx in [place] (she was HPV pos); rec to try Biotene for dry mouth    Other medical issues>> Hx GERD, Hx DJD, Hx Bipolar, Hx skin cancers... EXAM reveals Afeb, VSS, O2sat=92% on RA at rest;  HEENT- denture, no lesions seen, mallampati2;  Chest- decr BS bilat w/o w/r/r;  Heart- RR w/o m/r/g;  Ext- neg w/o c/c/e...  We reviewed prob list, meds, xrays and labs>> Given the 2016 Flu vaccine today; home O2 supplied by [facility]... IMP/PLAN>>  Samhitha must quit all smoking & stay quit; rec to continue the Stiolto, use Albut rescue inhaler as needed; use the nocturnal O2 at 2L/min Qhs; get on diet & increase exercise program... She will f/u in 83mo, sooner if needed.  ~  January 01, 2016:  73mo ROV & Lori Day is still smoking ~3cig/d AND taking Chantix;  She notes that her breathing is "great" and she continues on Stiolto- 2sp/d and AlbutHFA rescue inhaler but she hasn't needed this in months;  She also uses Home O2 Qhs due to nocturnal hypoxemia on ONO test- she gets this from [facility] but wants to switch to [facility] she says;  She reports that she had Thyroid surg in [place] 08/28/15 (no records avail to review- she says a nonfunctioning benign nodule) and she had a bout of Shingles in her left hip area 10/30/15 treated by her PCP & resolved... She reports that sister Dx w/ ovarian cancer- pt had genetic testing "I don't have any mutant genes" she says (Oncology note from  12/12/15 is reviewed)...  EXAM reveals Afeb, VSS, O2sat=94% on RA at rest;  HEENT- denture, no lesions seen, mallampati2;  Chest- decr BS bilat w/o w/r/r;  Heart- RR w/o m/r/g;  Abd- soft nontender neg;  Ext- neg w/o c/c/e...  IMP/PLAN>>  Pt wants to change her DME to [facility]- ok w/ me;  We will refill her Stiolto;  Most important intervention is to [place]!!!  We plan ROV in 70mo, sooner if needed prn...   ~  July 03, 2016:  PCP= Marda Stalker, [place];  6 month Freedom is stable w/ her GOLD Stage2 COPD/ emphysema- back  to smoking 1ppd, on O2 at 2L/min Qhs ("it helps me sleep"), Stiolto- 2sp/d, AlbutHFA rescue prn, and Flonase/ claritin prn;  She did pretty well w/ Chantix & will try this again...  She had colonoscopy several wks ago by DrOutlaw- polyp removed & f/u planned in 5 yrs... We reviewed the following medical problems during today's office visit >>     Mod COPD/emphysema w/ small reversible component> on Stiolto-2sp per day + AlbutHFA rescue inhaler; states she is improved on the Stiolto & hasn't needed the rescue inhaler recently; rec to continue the same...    Nocturnal hypoxemia> on O2 at 2L/min Qhs; several sleep tests in the past w/o signif OSA...    Current cigarette smoker> she reports that Chantix helped her quit, was using e-cig, then went on a cruise & started cigarettes again- now back to 1ppd and "life is good" she says; we refilled the Chantix Rx for her...    Hx cancer at base of tongue> s/p XRT, chemoRx in [place] (she was HPV pos); rec to try Biotene for dry mouth    S/p thyroid surg in [place] 07/2015> no records avail to review...    Other medical issues>> Hx GERD, Hx DJD, Hx Bipolar, Hx skin cancers... EXAM reveals Afeb, VSS, O2sat=95% on RA at rest;  Wt down 17# to 179# today; HEENT- denture, no lesions seen, mallampati2;  Chest- decr BS bilat w/o w/r/r;  Heart- RR w/o m/r/g;  Ext- neg w/o c/c/e...   MRI Neck 04/10/16>  Asymmetric soft tissue at the right  tongue base may reflect post radiation change; no discrete mass, no adenopathy, mils asymmetric submandib glands R>L...  CXR 04/10/16>  Norm heart size, mild hyperinflation, scarring in upper lobes & bases- NAD, no adenopathy, boney demineralization is apparent... IMP/PLAN>>  Lori Day is her own worst enemy- she must quit all smoking 7 stay quit, Chantix refilled, stay on Stiolto & Albut rescue, 2017 Flu shot given today...  ~  December 31, 2016:  12mo ROV & Lori Day continues to get her general medical care thru [facility], [place]);  She has COPD/emphysema on Stiolto, continued smoking, nocturnal hypoxemia, hx cancer at base of tongue... She denies CP, palpit, change in SOB/DOE, and denies cough/ sput/ hemoptysis, etc; we reviewed the following medical problems during today's office visit >>     Mod COPD/emphysema w/ small reversible component> on Stiolto-2sp per day + AlbutHFA rescue inhaler; states she is improved on the Stiolto & hasn't needed the rescue inhaler recently; rec to continue same; DrSquires has discussed low-dose screening CT program w/ her...    Nocturnal hypoxemia> on O2 at 2L/min Qhs; several sleep tests in the past w/o signif OSA...    Current cigarette smoker> prev back to 1ppd stating "life is good"; we refilled the Chantix Rx for her & she has decr to 3-4 cig/d she says & unable to quit due to stress...    Hx cancer at base of tongue> Dx 2014, s/p XRT (DrSquires follows her locally), chemoRx in [place] (she was HPV pos); rec to try Biotene for dry mouth; she sees DrRosen for ENT locally...    S/p thyroid surg in [place] 07/2015> no records avail to review; she is on Synthroid66mcg/d-- last TSH in Epic was 08/2016=5.20...    Other medical issues>> Hx GERD (on Prilosec), Hx DJD (on Glucosamine), Hx Bipolar (on Prozac), Hx skin cancers, Hearing loss... EXAM reveals Afeb, VSS, O2sat=97% on RA at rest;  Wt back up 13# to 191# today;  HEENT- denture, no lesions  seen, mallampati2;  Chest- decr BS bilat w/o w/r/r;  Heart- RR w/o m/r/g;  Ext- neg w/o c/c/e...  IMP/PLAN>>  Todd is stable w/ her COPD, hypoxemia- unfortunately continue smoking & we discussed this again, Chantix helped in ast 7 asked to restart; Stiolto reflled & we plan continue 80mo rov...  ~  June 26, 2017:  70mo ROV & Lori Day reports that she quit smoking 02/25/17 & has been off the Chantix for the last 2wks;  Her breathing is improved as a result & she feels that she can tell a difference having quit smoking!  She notes mild intermit cough, small amt thick beige sput, no hemoptysis; SOB/DOE is about the same eg-walking up hill & she is NOT exercising (next step=> refer to pulm rehab)... [place] Flu vaccine today... We reviewed the following interval Epic records>      She saw ENT-DrRosen 02/27/17 & following>  37yrs s/p chemo/XRT for oropharyngeal Ca in [place]; she tried to stop Omep for her LPR; exam & laryngoscopy were wnl; he ordered Ba swallow (done 05/02/17):  IMPRESSION: 1) Normal oral and pharyngeal phases of swallowing, with no laryngeal penetration or tracheobronchial aspiration. 2) Mild cricopharyngeus muscle dysfunction, characteristic of chronic gastroesophageal reflux disease. 3) Mild esophageal dysmotility, with a pattern characteristic of reflux related dysmotility. 4) Tiny sliding hiatal hernia.  Mild gastroesophageal reflux. 5) No evidence of reflux esophagitis. No evidence of esophageal mass, stricture or ulcer.    She went to the [facility]) on 06/10/17>  Hx osteoporosis (Tscore-3.5 in [place] & -2.5 in [place]) & compression fx of T12; they rec lowest PPI dose needed & poss change to H2Blocker if able; they did labs- SPE was wnl, Chems- wnl (Ca=9.6), PTH- wnl, VitD=22.7 (30-100);  Pt rec to take calcium, glucosamine, consider Reclast vs Prolia...  We reviewed the following medical problems during today's office visit>      Mod COPD/emphysema w/ small reversible  component> on Stiolto-2sp per day + Proair-HFA rescue inhaler; states she is improved on the Stiolto & hasn't needed the rescue inhaler recently; rec to continue same; DrSquires has discussed low-dose screening CT program w/ pt in the past...    Nocturnal hypoxemia> on O2 at 2L/min Qhs; several sleep tests in the past w/o signif OSA...    Ex-cigarette smoker> prev 1ppd stating "life is good"; we refilled the Chantix Rx for her & she decr to 3-4 cig/d & was able to [facility] 02/2017...    Hx cancer at base of tongue> Dx 2014, s/p XRT (DrSquires follows her locally), chemoRx in [place] (she was HPV pos); rec to try Biotene for dry mouth; she sees DrRosen for ENT...    S/p thyroid surg in [place] 07/2015> no records avail to review; she is on Synthroid46mcg/d-- last TSH in Epic was 08/2016=5.20  (PCP is Marda Stalker, [place] at [facility])    Other medical issues>> Hx GERD (on Prilosec20Qod & Zantac150Qod), Hx DJD (on Glucosamine), Osteoporosis (seen in their clinic), Hx Bipolar (on Prozac), Hx skin cancers, Hearing loss... EXAM reveals Afeb, VSS, O2sat=95% on RA at rest;  Wt back up 8# to 198# today; HEENT- denture, no lesions seen, mallampati2;  Chest- decr BS bilat w/o w/r/r;  Heart- RR w/o m/r/g;  Ext- neg w/o c/c/e...   CXR 06/26/17 (independently reviewed by me in the PACS system) showed norm heart size, aortic atherosclerosis, coarse interstitial markings & mild basilar atx- NAD, DDD in Tspine... IMP/PLAN>>  Lori Day has quit smoking-  congrats;  We will refer to [facility];  rec to continue same meds;  We will continue Q49mo rov's and sooner if needed prn...   ~  December 25, 2017:  67mo ROV & Lori Day is stable- she has been in [facility] attending on [employer], states that she is doing great!  She loves the Rehab/ PT & feeling much better overall;  She reports that she started smoking again, then QUIT again!  Now using Wellbutrin per her PCP but she is convineced it's the exercise... she notes  mild intermittent cough, small amt clear sput, no hemoptysis;  SOB/DOE is improved w/ the pulm rehab exercises and she denies CP/ palpit/ edema... she is still using the O2 Qhs but doesn't like it, says she sleeps better without the O2 in her nose, it gives her a HA etc;  She requests to recheck another ONO to see if she still needs the oxygen...     She had RECLAST for her osteoporosis from [facility] 08/2017... We reviewed the following medical problems during today's office visit>      Mod COPD/emphysema w/ small reversible component> on Stiolto-2sp per day + Proair-HFA rescue inhaler; states she is improved on the Stiolto & hasn't needed the rescue inhaler recently; rec to continue same; DrSquires has discussed low-dose screening CT program w/ pt in the past; She is enrolled in the [facility] program at [place] doing very well...    Nocturnal hypoxemia> on O2 at 2L/min Qhs; several sleep tests in the past w/o signif OSA but severe desaturations=> she requests repeat now since she feels so much better off cigs and on the exercise program....    Ex-cigarette smoker> prev 1ppd stating "life is good"; we refilled the Chantix Rx for her & she decr to 3-4 cig/d & was able to [place] 02/2017; subseq restarted then quit again w/ Wellburtin rx...    Hx cancer at base of tongue> Dx 2014, s/p XRT (DrSquires follows her locally), chemoRx in [place] (she was HPV pos); rec to try Biotene for dry mouth; she sees DrRosen for ENT...    S/p thyroid surg in [place] 07/2015> no records avail to review; she is on Synthroid43mcg/d-- last TSH in Epic was 08/2016=5.20  (PCP is Marda Stalker, PA at [facility]) they do labs & we do not have copies of report.    Other medical issues>> Hx GERD (on Prilosec20Qod & Zantac150Qod), Hx DJD (on Glucosamine), Osteoporosis (seen in their clinic), Hx Bipolar (on Prozac), Hx skin cancers, Hearing loss... EXAM reveals Afeb, VSS, O2sat=95% on RA at rest;  Wt stable at 199# today; HEENT-  denture, no lesions seen, mallampati2;  Chest- decr BS bilat w/o w/r/r;  Heart- RR w/o m/r/g;  Ext- neg w/o c/c/e...   ONO done 12/29/17 on RA>  O2sats were <88% for 3.5H of the 4H study-- she definitely still needs the O2 2L/min Qhs!!! IMP/PLAN>>  Jaunita has mod COPD/emphysema & stable on the Stiolto, off cigs, on pulm rehab & her nocturnal O2 which she clearly needs to continue... Rec to keep up the good work & recheck in 75mo...    Past Medical History  Diagnosis Date    COPD/emphsema, cig smoker >>      Hx OSA, nocturnal hypoxemia >>    . 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  Thyroid nodule >> states she had eval & f/u in [place], Bx=neg   . Hx of chronic sinusitis   . GERD (gastroesophageal reflux disease) >> on Omeprazole20   . Bipolar 1 disorder >> she reports off all meds x PROZAC20   . 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 Hiatal hernia   . History of HPV infection   . Glaucoma   . Sciatica   . Sensory hearing loss, bilateral   . Sjogren's syndrome 09-02-2007  . 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 Skin cancer w/ Moh's surg on right side of nose     Past Surgical History:  Procedure Laterality Date  . ABDOMINAL HYSTERECTOMY    . APPENDECTOMY  1959  . BREAST BIOPSY     three times, twice on Left, once on Right, diagnosis as calcifications 1994-1999  . CATARACT EXTRACTION Bilateral 05-29-10, 06-14-10  . CHOLECYSTECTOMY    . KNEE SURGERY  08-15-11   arthoscopy  . tongue cancer    . TONSILECTOMY, ADENOIDECTOMY, BILATERAL MYRINGOTOMY AND TUBES  1955  . TUBAL LIGATION  1981  . turbinate surgery     Dr. Nicole Cella in [place].    Outpatient Encounter Medications as of 12/25/2017  Medication Sig  . Albuterol Sulfate (PROAIR RESPICLICK) 798 (90 Base) MCG/ACT AEPB Inhale 2 puffs every 6 (six)  hours as needed into the lungs.  Marland Kitchen b complex vitamins tablet Bid po OTC  . buPROPion (WELLBUTRIN XL) 150 MG 24 hr tablet TAKE 1 TABLET BY MOUTH EVERY DAY IN THE MORNING  . calcium carbonate (CALCIUM 600) 600 MG TABS tablet Take 600 mg by mouth daily with breakfast.  . Calcium-Magnesium-Vitamin D (CALCIUM 1200+D3 PO) Take by mouth.  . cetirizine (ZYRTEC) 10 MG tablet Take 10 mg by mouth daily as needed for allergies.  . Cholecalciferol (HM VITAMIN D3) 4000 units CAPS Take 1 capsule by mouth daily.  Marland Kitchen FLUoxetine (PROZAC) 20 MG capsule Take 20 mg by mouth.  . fluticasone (FLONASE) 50 MCG/ACT nasal spray Place 2 sprays into both nostrils daily.  Marland Kitchen GLUCOSA-CHONDR-NA CHONDR-MSM PO Take by mouth daily. W/ vit d  . levothyroxine (SYNTHROID, LEVOTHROID) 75 MCG tablet Take 1 tablet (75 mcg total) by mouth daily before breakfast.  . omeprazole (PRILOSEC) 20 MG capsule Take 20 mg by mouth every other day.   . Tiotropium Bromide-Olodaterol (STIOLTO RESPIMAT) 2.5-2.5 MCG/ACT AERS Inhale 2 puffs into the lungs daily.  . TURMERIC PO Take by mouth.  . valACYclovir (VALTREX) 1000 MG tablet Take 1,000 mg by mouth 2 (two) times daily.  . [DISCONTINUED] ranitidine (ZANTAC) 150 MG tablet Take 150 mg by mouth every other day.  . ranitidine (ZANTAC) 150 MG tablet Take 1 tablet (150 mg total) by mouth as directed. Take 1 every other day   No facility-administered encounter medications on file as of 12/25/2017.     Allergies  Allergen Reactions  . Aspirin Swelling  . Compazine [Prochlorperazine Edisylate]     Slurred speech   . Pseudophed-Chlophedianol-Gg     Side effects  . Seroquel [Quetiapine Fumarate] Swelling  . Latex Rash  . Tape Rash    adhesive    Immunization History  Administered Date(s) Administered  . Influenza,inj,Quad PF,6+ Mos 07/04/2015, 07/03/2016, 06/26/2017  . Pneumococcal Conjugate-13 06/29/2011  . Pneumococcal Polysaccharide-23 10/28/2013    Current Medications, Allergies, Past  Medical History, Past Surgical History, Family History, and Social History were reviewed in [employer] record.  Review of Systems            All symptoms NEG except where BOLDED >>  Constitutional:  F/C/S, fatigue, anorexia, unexpected weight change. HEENT:  HA, visual changes, hearing loss, earache, nasal symptoms, sore throat, mouth sores, hoarseness. Resp:  cough, sputum, hemoptysis; SOB, tightness, wheezing. Cardio:  CP, palpit, DOE, orthopnea, edema. GI:  N/V/D/C, blood in stool; reflux, abd pain, distention, gas. GU:  dysuria, freq, urgency, hematuria, flank pain, voiding difficulty. MS:  joint pain, swelling, tenderness, decr ROM; neck pain, back pain, etc. Neuro:  HA, tremors, seizures, dizziness, syncope, weakness, numbness, gait abn. Skin:  suspicious lesions or skin rash. Heme:  adenopathy, bruising, bleeding. Psyche:  confusion, agitation, sleep disturbance, hallucinations, anxiety, depression suicidal.   Objective:   Physical Exam      Vital Signs:  Reviewed...   General:  WD, WN, 72 y/o WF in NAD; alert & oriented; pleasant & cooperative... HEENT:  [place]/AT; Conjunctiva- pink, Sclera- nonicteric, EOM-wnl, PERRLA, EACs-clear, TMs-wnl; NOSE-clear; THROAT- dentures, neg w/o lesions seen.  Neck:  Supple w/ fair ROM; no JVD; normal carotid impulses w/o bruits; no thyromegaly or nodules palpated; no lymphadenopathy.  Chest:  decr BS bilat without wheezes, rales, or rhonchi heard. Heart:  Regular Rhythm; norm S1 & S2 without murmurs, rubs, or gallops detected. Abdomen:  Soft & nontender- no guarding or rebound; normal bowel sounds; no organomegaly or masses palpated. Ext:  Normal ROM; without deformities or arthritic changes; no varicose veins, venous insuffic, or edema;  Pulses intact w/o bruits. Neuro:  No focal neuro deficit; gait normal & balance OK. Derm:  No lesions noted; no rash etc. Lymph:  No cervical, supraclavicular, axillary, or inguinal  adenopathy palpated.   Assessment:      IMP >>  1)  Mod COPD/emphysema w/ small reversible component-- on Stiolto-2sp per day + AlbutHFA rescue inhaler... 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 Other medical issues>>  Hx GERD, Hx DJD, Hx Bipolar, Hx skin cancers   PLAN >>  04/18/15>  Naira has mod COPD/emphysema & a small reversible component; she needs to quit all smoking & use the Stiolto regularly, plus her prn rescue inhaler;  She has been found to have nocturnal hypoxemia but no signif OSA at this time- we will proceed w/ her ONO to see if she needs nocturnal oxygen... 07/04/15>  Mollie must quit all smoking & stay quit; rec to continue the Stiolto, use Albut rescue inhaler as needed; use the nocturnal O2 at 2L/min Qhs; get on diet & increase exercise program... She will f/u in 71mo, sooner if needed. 01/01/16>  Pt wants to change her DME to [facility]- ok w/ me;  We will refill her Stiolto;  Most important intervention is to [place]!!!  We plan ROV in 52mo, sooner if needed prn 07/03/16>  Nalaya is her own worst enemy- she must quit all smoking 7 stay quit, Chantix refilled, stay on Stiolto & Albut rescue, 2017 Flu shot given today... 12/31/16>  Arzella is stable w/ her COPD, hypoxemia- unfortunately continue smoking & we discussed this again, Chantix helped in ast 7 asked to restart; Stiolto reflled & we plan continue 39mo rov 06/26/17>  Lori Day has quit smoking- congrats;  We will refer to [facility];  rec to continue same meds;  We will continue Q59mo rov's and sooner if needed prn. 12/25/17>  Lori Day has mod COPD/emphysema & stable on the Stiolto, off cigs, on pulm rehab & her nocturnal O2 which she clearly needs to continue... Rec to keep up the good work & recheck in 47mo   Plan:     Patient's Medications  New Prescriptions   RANITIDINE (ZANTAC) 150 MG  TABLET    Take 1 tablet (150 mg total) by mouth as directed. Take 1 every other day  Previous Medications   ALBUTEROL SULFATE (PROAIR RESPICLICK) 008 (90 BASE) MCG/ACT AEPB    Inhale 2 puffs every 6 (six) hours as needed into the lungs.   B COMPLEX VITAMINS TABLET    Bid po OTC   BUPROPION (WELLBUTRIN XL) 150 MG 24 HR TABLET    TAKE 1 TABLET BY MOUTH EVERY DAY IN THE MORNING   CALCIUM CARBONATE (CALCIUM 600) 600 MG TABS TABLET    Take 600 mg by mouth daily with breakfast.   CALCIUM-MAGNESIUM-VITAMIN D (CALCIUM 1200+D3 PO)    Take by mouth.   CETIRIZINE (ZYRTEC) 10 MG TABLET    Take 10 mg by mouth daily as needed for allergies.   CHOLECALCIFEROL (HM VITAMIN D3) 4000 UNITS CAPS    Take 1 capsule by mouth daily.   FLUOXETINE (PROZAC) 20 MG CAPSULE    Take 20 mg by mouth.   FLUTICASONE (FLONASE) 50 MCG/ACT NASAL SPRAY    Place 2 sprays into both nostrils daily.   GLUCOSA-CHONDR-NA CHONDR-MSM PO    Take by mouth daily. W/ vit d   LEVOTHYROXINE (SYNTHROID, LEVOTHROID) 75 MCG TABLET    Take 1 tablet (75 mcg total) by mouth daily before breakfast.   OMEPRAZOLE (PRILOSEC) 20 MG CAPSULE    Take 20 mg by mouth every other day.    TIOTROPIUM BROMIDE-OLODATEROL (STIOLTO RESPIMAT) 2.5-2.5 MCG/ACT AERS    Inhale 2 puffs into the lungs daily.   TURMERIC PO    Take by mouth.   VALACYCLOVIR (VALTREX) 1000 MG TABLET    Take 1,000 mg by mouth 2 (two) times daily.  Modified Medications   No medications on file  Discontinued Medications   RANITIDINE (ZANTAC) 150 MG TABLET    Take 150 mg by mouth every other day.

## 2017-12-25 NOTE — Patient Instructions (Signed)
Today we updated your med list in our EPIC system...    Continue your current medications the same...  We refilled your ZANTAC (Ranitadine) 150mg  tabs- take one tab in the evening every other day (and alternate w/ your Omeprazole)...  We will sched another Overnight Oximetry test to see how you are doing & if you still need the oxygen...  You have come a long way & made great strides!!!    congrats on not smoking...    congrats on doing the [employer] program...  KEEP UP THE GOOD WORK...  Call for any questions...  Let's plan a follow up visit in 96mo, sooner if needed for problems.Marland KitchenMarland Kitchen

## 2017-12-29 DIAGNOSIS — R0902 Hypoxemia: Secondary | ICD-10-CM | POA: Diagnosis not present

## 2017-12-29 DIAGNOSIS — J449 Chronic obstructive pulmonary disease, unspecified: Secondary | ICD-10-CM | POA: Diagnosis not present

## 2017-12-30 ENCOUNTER — Encounter (HOSPITAL_COMMUNITY)
Admission: RE | Admit: 2017-12-30 | Discharge: 2017-12-30 | Disposition: A | Discharge status: Home / Self Care | Payer: Medicare Other | Source: Ambulatory Visit | Attending: Pulmonary Disease | Admitting: Pulmonary Disease

## 2017-12-30 VITALS — Wt 198.4 lb

## 2017-12-30 DIAGNOSIS — J439 Emphysema, unspecified: Secondary | ICD-10-CM

## 2017-12-30 NOTE — Progress Notes (Signed)
Daily Session Note  Patient Details  Name: Lori Day MRN: [id number] Date of Birth: 1946-07-16 Referring Provider:     Pulmonary Rehab Walk Test from 09/30/2017 in [place]  Referring Provider  Dr. Lenna Gilford      Encounter Date: 12/30/2017  Check In: Session Check In - 12/30/17 1030      Check-In   Location  [facility]    Staff Present  Rosebud Poles, RN, BSN;Ronella Plunk, MS, ACSM RCEP, Exercise Physiologist;Lisa Ysidro Evert, RN;Portia Rollene Rotunda, RN, BSN    Supervising physician immediately available to respond to emergencies  [facility] immediately available    Physician(s)  Dr. Eliseo Squires    Medication changes reported      No    Fall or balance concerns reported     No    Tobacco Cessation  No Change    Warm-up and Cool-down  Performed as group-led instruction    Resistance Training Performed  Yes    VAD Patient?  No      Pain Assessment   Currently in Pain?  No/denies    Multiple Pain Sites  No       Capillary Blood Glucose: No results found for this or any previous visit (from the past 24 hour(s)).  Exercise Prescription Changes - 12/30/17 1200      Response to Exercise   Blood Pressure (Admit)  102/68    Blood Pressure (Exercise)  136/80    Blood Pressure (Exit)  102/70    Heart Rate (Admit)  68 bpm    Heart Rate (Exercise)  95 bpm    Heart Rate (Exit)  79 bpm    Oxygen Saturation (Admit)  95 %    Oxygen Saturation (Exercise)  91 %    Oxygen Saturation (Exit)  93 %    Rating of Perceived Exertion (Exercise)  12    Perceived Dyspnea (Exercise)  1    Duration  Progress to 45 minutes of aerobic exercise without signs/symptoms of physical distress    Intensity  THRR unchanged      Progression   Progression  Continue to progress workloads to maintain intensity without signs/symptoms of physical distress.      Resistance Training   Training Prescription  Yes    Weight  blue bands    Reps  10-15    Time  10  Minutes      Bike   Level  5    Minutes  17      NuStep   Level  5    Minutes  17    METs  2.3      Track   Laps  17    Minutes  17       Social History   Tobacco Use  Smoking Status Former Smoker  . Packs/day: 1.00  . Years: 50.00  . Pack years: 50.00  . Types: Cigarettes  . Last attempt to quit: 11/28/2017  . Years since quitting: 0.0  Smokeless Tobacco Never Used    Goals Met:  Exercise tolerated well No report of cardiac concerns or symptoms Strength training completed today  Goals Unmet:  Not Applicable  Comments: Service time is from 10:30a to 12:10p    Dr. Rush Farmer is Medical Director for Pulmonary Rehab at [facility].

## 2018-01-01 ENCOUNTER — Encounter (HOSPITAL_COMMUNITY)
Admission: RE | Admit: 2018-01-01 | Discharge: 2018-01-01 | Disposition: A | Discharge status: Home / Self Care | Payer: Medicare Other | Source: Ambulatory Visit | Attending: Pulmonary Disease | Admitting: Pulmonary Disease

## 2018-01-01 DIAGNOSIS — J439 Emphysema, unspecified: Secondary | ICD-10-CM | POA: Diagnosis not present

## 2018-01-01 NOTE — Progress Notes (Signed)
Daily Session Note  Patient Details  Name: Lori Day MRN: [id number] Date of Birth: 1946-03-24 Referring Provider:     Pulmonary Rehab Walk Test from 09/30/2017 in [place]  Referring Provider  Dr. Lenna Gilford      Encounter Date: 01/01/2018  Check In: Session Check In - 01/01/18 1052      Check-In   Location  [facility]    Staff Present  Trish Fountain, RN, BSN;Caelan Branden, MS, ACSM RCEP, Exercise Physiologist;Joan Leonia Reeves, RN, Roque Cash, RN    Supervising physician immediately available to respond to emergencies  [facility] immediately available    Physician(s)  Dr. Horris Latino    Medication changes reported      No    Fall or balance concerns reported     No    Tobacco Cessation  No Change    Warm-up and Cool-down  Performed as group-led instruction    Resistance Training Performed  Yes    VAD Patient?  No      Pain Assessment   Currently in Pain?  No/denies    Multiple Pain Sites  No       Capillary Blood Glucose: No results found for this or any previous visit (from the past 24 hour(s)).    Social History   Tobacco Use  Smoking Status Former Smoker  . Packs/day: 1.00  . Years: 50.00  . Pack years: 50.00  . Types: Cigarettes  . Last attempt to quit: 11/28/2017  . Years since quitting: 0.0  Smokeless Tobacco Never Used    Goals Met:  Exercise tolerated well No report of cardiac concerns or symptoms Strength training completed today  Goals Unmet:  Not Applicable  Comments: Service time is from 10:30a to 12:15p    Dr. Rush Farmer is Medical Director for Pulmonary Rehab at [facility].

## 2018-01-05 NOTE — Progress Notes (Signed)
Pulmonary Individual Treatment Plan  Patient Details  Name: Lori Day MRN: [id number] Date of Birth: 05-Mar-1946 Referring Provider:     Pulmonary Rehab Walk Test from 09/30/2017 in [facility]  Referring Provider  Dr. Lenna Gilford      Initial Encounter Date:    Pulmonary Rehab Walk Test from 09/30/2017 in [facility]  Date  10/02/17  Referring Provider  Dr. Lenna Gilford      Visit Diagnosis: Pulmonary emphysema, unspecified emphysema type ([place])  Patient's Home Medications on Admission:   Current Outpatient Medications:  .  Albuterol Sulfate (PROAIR RESPICLICK) 299 (90 Base) MCG/ACT AEPB, Inhale 2 puffs every 6 (six) hours as needed into the lungs., Disp: 1 each, Rfl: 0 .  b complex vitamins tablet, Bid po OTC, Disp: , Rfl:  .  buPROPion (WELLBUTRIN XL) 150 MG 24 hr tablet, TAKE 1 TABLET BY MOUTH EVERY DAY IN THE MORNING, Disp: , Rfl: 1 .  calcium carbonate (CALCIUM 600) 600 MG TABS tablet, Take 600 mg by mouth daily with breakfast., Disp: , Rfl:  .  Calcium-Magnesium-Vitamin D (CALCIUM 1200+D3 PO), Take by mouth., Disp: , Rfl:  .  cetirizine (ZYRTEC) 10 MG tablet, Take 10 mg by mouth daily as needed for allergies., Disp: , Rfl:  .  Cholecalciferol (HM VITAMIN D3) 4000 units CAPS, Take 1 capsule by mouth daily., Disp: , Rfl:  .  FLUoxetine (PROZAC) 20 MG capsule, Take 20 mg by mouth., Disp: , Rfl:  .  fluticasone (FLONASE) 50 MCG/ACT nasal spray, Place 2 sprays into both nostrils daily., Disp: , Rfl:  .  GLUCOSA-CHONDR-NA CHONDR-MSM PO, Take by mouth daily. W/ vit d, Disp: , Rfl:  .  levothyroxine (SYNTHROID, LEVOTHROID) 75 MCG tablet, Take 1 tablet (75 mcg total) by mouth daily before breakfast., Disp: 30 tablet, Rfl: 6 .  omeprazole (PRILOSEC) 20 MG capsule, Take 20 mg by mouth every other day. , Disp: , Rfl:  .  ranitidine (ZANTAC) 150 MG tablet, Take 1 tablet (150 mg total) by mouth as directed. Take 1 every other day, Disp: 45  tablet, Rfl: 0 .  Tiotropium Bromide-Olodaterol (STIOLTO RESPIMAT) 2.5-2.5 MCG/ACT AERS, Inhale 2 puffs into the lungs daily., Disp: 3 Inhaler, Rfl: 4 .  TURMERIC PO, Take by mouth., Disp: , Rfl:  .  valACYclovir (VALTREX) 1000 MG tablet, Take 1,000 mg by mouth 2 (two) times daily., Disp: , Rfl:   Past Medical History: Past Medical History:  Diagnosis Date  . Arthritis   . Bipolar 1 disorder ([place])   . 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 She quit in 2013.   Marland Kitchen GERD (gastroesophageal reflux disease)   . Glaucoma   . Hiatal hernia   . History of HPV infection   . Hx of chronic sinusitis   . 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 Sciatica   . Sensory disturbance 09/19/2014  . Sensory hearing loss, bilateral   . Sjogren's syndrome ([place]) 09-02-2007  . 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 Thyroid nodule   . Tongue cancer ([place]) 02-02-2013   HPV/ radiation/chemo    Tobacco Use: Social History   Tobacco Use  Smoking Status Former Smoker  . Packs/day: 1.00  . Years: 50.00  . Pack years: 50.00  . Types: Cigarettes  . Last attempt to quit: 11/28/2017  . Years since quitting: 0.1  Smokeless Tobacco Never Used  Labs: Recent Review Flowsheet Data    There is no flowsheet data to display.      Capillary Blood Glucose: No results found for: GLUCAP   Pulmonary Assessment Scores: Pulmonary Assessment Scores    Row Name 10/02/17 0714         ADL UCSD   ADL Phase  Entry       mMRC Score   mMRC Score  1        Pulmonary Function Assessment: Pulmonary Function Assessment - 09/26/17 1118      Breath   Bilateral Breath Sounds  Clear    Shortness of Breath  Limiting activity uphill walking       Exercise Target Goals:    Exercise Program Goal: Individual exercise prescription set using results from initial 6 min walk test and THRR while considering  patient's activity barriers and safety.     Exercise Prescription Goal: Initial exercise prescription builds to 30-45 minutes a day of aerobic activity, 2-3 days per week.  Home exercise guidelines will be given to patient during program as part of exercise prescription that the participant will acknowledge.  Activity Barriers & Risk Stratification: Activity Barriers & Cardiac Risk Stratification - 09/26/17 1050      Activity Barriers & Cardiac Risk Stratification   Activity Barriers  Shortness of Breath tendonitis right wrist       6 Minute Walk:   Oxygen Initial Assessment: Oxygen Initial Assessment - 10/02/17 0713      Initial 6 min Walk   Oxygen Used  None      Program Oxygen Prescription   Program Oxygen Prescription  Continuous    Liters per minute  2    Comments  Patient desaturated to 85% on her 6MWT. Will monitor during first day to see how she does but its predicted we will need to use atleast 2 liters       Oxygen Re-Evaluation: Oxygen Re-Evaluation    Row Name 10/23/17 0717 11/17/17 1515 11/17/17 1516 12/04/17 1626 01/05/18 1328     Program Oxygen Prescription   Program Oxygen Prescription  Continuous  Continuous  (Pended)   None  None  None   Liters per minute  2  -  0  0  0   Comments  Patient desaturated to 85% on her 6MWT. Will monitor during first day to see how she does but its predicted we will need to use atleast 2 liters  -  -  Patient desaturated to 85% on her 6MWT. Will monitor during first day to see how she does but its predicted we will need to use atleast 2 liters  Patient desaturated to 85% on her 6MWT. Will monitor during first day to see how she does but its predicted we will need to use atleast 2 liters     Home Oxygen   Home Oxygen Device  Home Concentrator  -  Home Concentrator  Home Concentrator  Home Concentrator   Sleep Oxygen Prescription  Continuous  -  Continuous  Continuous  Continuous   Liters per minute  2  -  2  2  2    Home Exercise Oxygen Prescription  None  -  None   None  None   Home at Rest Exercise Oxygen Prescription  None  -  None  None  None   Compliance with Home Oxygen Use  Yes  -  Yes  Yes  Yes     Goals/Expected Outcomes   Short Term Goals  To learn and exhibit compliance with exercise, home and travel O2 prescription;To learn and understand importance of monitoring SPO2 with pulse oximeter and demonstrate accurate use of the pulse oximeter.;To learn and understand importance of maintaining oxygen saturations>88%;To learn and demonstrate proper pursed lip breathing techniques or other breathing techniques.;To learn and demonstrate proper use of respiratory medications  -  To learn and exhibit compliance with exercise, home and travel O2 prescription;To learn and understand importance of monitoring SPO2 with pulse oximeter and demonstrate accurate use of the pulse oximeter.;To learn and understand importance of maintaining oxygen saturations>88%;To learn and demonstrate proper pursed lip breathing techniques or other breathing techniques.;To learn and demonstrate proper use of respiratory medications  To learn and exhibit compliance with exercise, home and travel O2 prescription;To learn and understand importance of monitoring SPO2 with pulse oximeter and demonstrate accurate use of the pulse oximeter.;To learn and understand importance of maintaining oxygen saturations>88%;To learn and demonstrate proper pursed lip breathing techniques or other breathing techniques.;To learn and demonstrate proper use of respiratory medications  To learn and exhibit compliance with exercise, home and travel O2 prescription;To learn and understand importance of monitoring SPO2 with pulse oximeter and demonstrate accurate use of the pulse oximeter.;To learn and understand importance of maintaining oxygen saturations>88%;To learn and demonstrate proper pursed lip breathing techniques or other breathing techniques.;To learn and demonstrate proper use of respiratory medications    Long  Term Goals  Exhibits compliance with exercise, home and travel O2 prescription;Verbalizes importance of monitoring SPO2 with pulse oximeter and return demonstration;Maintenance of O2 saturations>88%;Exhibits proper breathing techniques, such as pursed lip breathing or other method taught during program session;Compliance with respiratory medication;Demonstrates proper use of MDI's  -  Exhibits compliance with exercise, home and travel O2 prescription;Verbalizes importance of monitoring SPO2 with pulse oximeter and return demonstration;Maintenance of O2 saturations>88%;Exhibits proper breathing techniques, such as pursed lip breathing or other method taught during program session;Compliance with respiratory medication;Demonstrates proper use of MDI's  Exhibits compliance with exercise, home and travel O2 prescription;Verbalizes importance of monitoring SPO2 with pulse oximeter and return demonstration;Maintenance of O2 saturations>88%;Exhibits proper breathing techniques, such as pursed lip breathing or other method taught during program session;Compliance with respiratory medication;Demonstrates proper use of MDI's  Exhibits compliance with exercise, home and travel O2 prescription;Verbalizes importance of monitoring SPO2 with pulse oximeter and return demonstration;Maintenance of O2 saturations>88%;Exhibits proper breathing techniques, such as pursed lip breathing or other method taught during program session;Compliance with respiratory medication;Demonstrates proper use of MDI's   Comments  -  -  patient exhibits compliance  patient exhibits compliance  patient exhibits compliance      Oxygen Discharge (Final Oxygen Re-Evaluation): Oxygen Re-Evaluation - 01/05/18 1328      Program Oxygen Prescription   Program Oxygen Prescription  None    Liters per minute  0    Comments  Patient desaturated to 85% on her 6MWT. Will monitor during first day to see how she does but its predicted we will need to  use atleast 2 liters      Home Oxygen   Home Oxygen Device  Home Concentrator    Sleep Oxygen Prescription  Continuous    Liters per minute  2    Home Exercise Oxygen Prescription  None    Home at Rest Exercise Oxygen Prescription  None    Compliance with Home Oxygen Use  Yes      Goals/Expected Outcomes   Short Term Goals  To learn and exhibit compliance with exercise, home and  travel O2 prescription;To learn and understand importance of monitoring SPO2 with pulse oximeter and demonstrate accurate use of the pulse oximeter.;To learn and understand importance of maintaining oxygen saturations>88%;To learn and demonstrate proper pursed lip breathing techniques or other breathing techniques.;To learn and demonstrate proper use of respiratory medications    Long  Term Goals  Exhibits compliance with exercise, home and travel O2 prescription;Verbalizes importance of monitoring SPO2 with pulse oximeter and return demonstration;Maintenance of O2 saturations>88%;Exhibits proper breathing techniques, such as pursed lip breathing or other method taught during program session;Compliance with respiratory medication;Demonstrates proper use of MDI's    Comments  patient exhibits compliance       Initial Exercise Prescription: Initial Exercise Prescription - 10/02/17 0700      Date of Initial Exercise RX and Referring Provider   Date  10/02/17    Referring Provider  Dr. Lenna Gilford      Oxygen   Oxygen  Continuous    Liters  2      Bike   Level  0.4    Minutes  17      NuStep   Level  2    SPM  80    Minutes  17    METs  1.4      Track   Laps  10    Minutes  17      Prescription Details   Frequency (times per week)  2    Duration  Progress to 45 minutes of aerobic exercise without signs/symptoms of physical distress      Intensity   THRR 40-80% of Max Heartrate  60-120    Ratings of Perceived Exertion  11-13    Perceived Dyspnea  0-4      Progression   Progression  Continue  progressive overload as per policy without signs/symptoms or physical distress.      Resistance Training   Training Prescription  Yes    Weight  blue bands    Reps  10-15       Perform Capillary Blood Glucose checks as needed.  Exercise Prescription Changes: Exercise Prescription Changes    Row Name 10/09/17 1200 10/14/17 1500 10/16/17 1300 11/04/17 1400 11/06/17 1247     Response to Exercise   Blood Pressure (Admit)  104/62  122/66  110/60  116/60  118/70   Blood Pressure (Exercise)  150/70  142/56  134/60  120/62  130/78   Blood Pressure (Exit)  122/62  110/70  108/60  100/60  126/50   Heart Rate (Admit)  70 bpm  70 bpm  73 bpm  77 bpm  86 bpm   Heart Rate (Exercise)  99 bpm  103 bpm  108 bpm  103 bpm  111 bpm   Heart Rate (Exit)  81 bpm  76 bpm  84 bpm  87 bpm  94 bpm   Oxygen Saturation (Admit)  92 %  93 %  90 %  92 %  89 %   Oxygen Saturation (Exercise)  89 %  89 %  89 %  91 %  88 %   Oxygen Saturation (Exit)  91 %  95 %  91 %  91 %  92 %   Rating of Perceived Exertion (Exercise)  11  12  12  13  12    Perceived Dyspnea (Exercise)  1  0  1  1  1    Duration  Progress to 45 minutes of aerobic exercise without signs/symptoms of physical distress  Progress to 45  minutes of aerobic exercise without signs/symptoms of physical distress  Progress to 45 minutes of aerobic exercise without signs/symptoms of physical distress  Progress to 45 minutes of aerobic exercise without signs/symptoms of physical distress  Progress to 45 minutes of aerobic exercise without signs/symptoms of physical distress   Intensity  Other (comment) 40-80% HRR  THRR unchanged  THRR unchanged  THRR unchanged  THRR unchanged     Progression   Progression  Continue to progress workloads to maintain intensity without signs/symptoms of physical distress.  Continue to progress workloads to maintain intensity without signs/symptoms of physical distress.  Continue to progress workloads to maintain intensity without  signs/symptoms of physical distress.  Continue to progress workloads to maintain intensity without signs/symptoms of physical distress.  Continue to progress workloads to maintain intensity without signs/symptoms of physical distress.     Resistance Training   Training Prescription  Yes  Yes  Yes  Yes  Yes   Weight  blue bands  blue bands  blue bands  blue bands  blue bands   Reps  10-15  10-15  10-15  10-15  10-15   Time  -  10 Minutes  10 Minutes  10 Minutes  10 Minutes     Bike   Level  - carpel tunnel was bothered by AD bike, switched to recumbent  -  -  -  -     Recumbant Bike   Level  1  1  2  2   -   Minutes  17  17  17  17   -     NuStep   Level  2  2  -  3  3   Minutes  17  17  -  17  17   METs  2  2.1  -  2  2.1     Track   Laps  14  19  19  18  21    Minutes  17  17  17  17  17    Row Name 11/11/17 1200 11/13/17 1200 11/18/17 1200 11/20/17 1231 11/25/17 1200     Response to Exercise   Blood Pressure (Admit)  106/54  100/56  100/64  122/70  114/70   Blood Pressure (Exercise)  130/70  124/50  142/70  158/80  150/60   Blood Pressure (Exit)  112/60  102/60  94/64  106/58  100/56   Heart Rate (Admit)  80 bpm  76 bpm  72 bpm  74 bpm  77 bpm   Heart Rate (Exercise)  104 bpm  105 bpm  119 bpm  116 bpm  115 bpm   Heart Rate (Exit)  94 bpm  83 bpm  88 bpm  72 bpm  88 bpm   Oxygen Saturation (Admit)  91 %  94 %  96 %  95 %  94 %   Oxygen Saturation (Exercise)  90 %  91 %  91 %  87 %  88 %   Oxygen Saturation (Exit)  92 %  93 %  92 %  95 %  93 %   Rating of Perceived Exertion (Exercise)  12  12  13  12  12    Perceived Dyspnea (Exercise)  1  0  1  0  1   Duration  Progress to 45 minutes of aerobic exercise without signs/symptoms of physical distress  Progress to 45 minutes of aerobic exercise without signs/symptoms of physical distress  Progress to 45 minutes of aerobic exercise  without signs/symptoms of physical distress  Progress to 45 minutes of aerobic exercise without  signs/symptoms of physical distress  Progress to 45 minutes of aerobic exercise without signs/symptoms of physical distress   Intensity  THRR unchanged  THRR unchanged  THRR unchanged  THRR unchanged  THRR unchanged     Progression   Progression  Continue to progress workloads to maintain intensity without signs/symptoms of physical distress.  Continue to progress workloads to maintain intensity without signs/symptoms of physical distress.  Continue to progress workloads to maintain intensity without signs/symptoms of physical distress.  Continue to progress workloads to maintain intensity without signs/symptoms of physical distress.  Continue to progress workloads to maintain intensity without signs/symptoms of physical distress.     Resistance Training   Training Prescription  Yes  Yes  Yes  Yes  Yes   Weight  blue bands  blue bands  blue bands  blue bands  blue bands   Reps  10-15  10-15  10-15  10-15  10-15   Time  10 Minutes  10 Minutes  10 Minutes  10 Minutes  10 Minutes     Treadmill   MPH  -  -  2.8  -  -   Grade  -  -  0  -  -   Minutes  -  -  17  -  -     Recumbant Bike   Level  2  3  3  4  4    Minutes  17  17  17  17  17      NuStep   Level  4  4  4   -  4   Minutes  17  17  17   -  17   METs  2.6  2.5  2.3  -  2.6     Track   Laps  21  -  -  21  20   Minutes  17  -  -  17  17     Home Exercise Plan   Plans to continue exercise at  -  Home (comment)  Home (comment)  Home (comment)  -   Frequency  -  Add 3 additional days to program exercise sessions.  Add 3 additional days to program exercise sessions.  Add 3 additional days to program exercise sessions.  -   Row Name 12/02/17 1200 12/16/17 1200 12/30/17 1200         Response to Exercise   Blood Pressure (Admit)  126/64  110/60  102/68     Blood Pressure (Exercise)  136/70  150/50  136/80     Blood Pressure (Exit)  100/50  112/68  102/70     Heart Rate (Admit)  70 bpm  71 bpm  68 bpm     Heart Rate (Exercise)  102  bpm  112 bpm  95 bpm     Heart Rate (Exit)  80 bpm  82 bpm  79 bpm     Oxygen Saturation (Admit)  94 %  94 %  95 %     Oxygen Saturation (Exercise)  90 %  91 %  91 %     Oxygen Saturation (Exit)  93 %  94 %  93 %     Rating of Perceived Exertion (Exercise)  11  12  12      Perceived Dyspnea (Exercise)  1  1  1      Duration  Progress to 45 minutes of aerobic exercise  without signs/symptoms of physical distress  Progress to 45 minutes of aerobic exercise without signs/symptoms of physical distress  Progress to 45 minutes of aerobic exercise without signs/symptoms of physical distress     Intensity  THRR unchanged  THRR unchanged  THRR unchanged       Progression   Progression  Continue to progress workloads to maintain intensity without signs/symptoms of physical distress.  Continue to progress workloads to maintain intensity without signs/symptoms of physical distress.  Continue to progress workloads to maintain intensity without signs/symptoms of physical distress.       Resistance Training   Training Prescription  Yes  Yes  Yes     Weight  blue bands  blue bands  blue bands     Reps  10-15  10-15  10-15     Time  10 Minutes  10 Minutes  10 Minutes       Bike   Level  -  -  5     Minutes  -  -  17       Recumbant Bike   Level  4  4  -     Minutes  17  17  -       NuStep   Level  5  5  5      Minutes  17  17  17      METs  2.8  2.7  2.3       Track   Laps  19  22  17      Minutes  17  17  17         Exercise Comments: Exercise Comments    Row Name 11/13/17 1257           Exercise Comments  Home exercise completed          Exercise Goals and Review: Exercise Goals    Row Name 09/26/17 1051             Exercise Goals   Increase Physical Activity  Yes       Intervention  Provide advice, education, support and counseling about physical activity/exercise needs.;Develop an individualized exercise prescription for aerobic and resistive training based on initial  evaluation findings, risk stratification, comorbidities and participant's personal goals.       Expected Outcomes  Achievement of increased cardiorespiratory fitness and enhanced flexibility, muscular endurance and strength shown through measurements of functional capacity and personal statement of participant.       Increase Strength and Stamina  Yes       Intervention  Provide advice, education, support and counseling about physical activity/exercise needs.;Develop an individualized exercise prescription for aerobic and resistive training based on initial evaluation findings, risk stratification, comorbidities and participant's personal goals.       Expected Outcomes  Achievement of increased cardiorespiratory fitness and enhanced flexibility, muscular endurance and strength shown through measurements of functional capacity and personal statement of participant.       Able to understand and use rate of perceived exertion (RPE) scale  Yes       Intervention  Provide education and explanation on how to use RPE scale       Expected Outcomes  Short Term: Able to use RPE daily in rehab to express subjective intensity level;Long Term:  Able to use RPE to guide intensity level when exercising independently       Able to understand and use Dyspnea scale  Yes       Intervention  Provide  education and explanation on how to use Dyspnea scale       Expected Outcomes  Short Term: Able to use Dyspnea scale daily in rehab to express subjective sense of shortness of breath during exertion;Long Term: Able to use Dyspnea scale to guide intensity level when exercising independently       Knowledge and understanding of Target Heart Rate Range (THRR)  Yes       Intervention  Provide education and explanation of THRR including how the numbers were predicted and where they are located for reference       Expected Outcomes  Short Term: Able to state/look up THRR       Understanding of Exercise Prescription  Yes        Intervention  Provide education, explanation, and written materials on patient's individual exercise prescription       Expected Outcomes  Short Term: Able to explain program exercise prescription;Long Term: Able to explain home exercise prescription to exercise independently          Exercise Goals Re-Evaluation : Exercise Goals Re-Evaluation    Row Name 10/16/17 0728 11/17/17 0747 12/04/17 1627 01/05/18 1330       Exercise Goal Re-Evaluation   Exercise Goals Review  Increase Strength and Stamina;Increase Physical Activity;Able to understand and use Dyspnea scale;Able to understand and use rate of perceived exertion (RPE) scale;Knowledge and understanding of Target Heart Rate Range (THRR);Understanding of Exercise Prescription  Increase Strength and Stamina;Increase Physical Activity;Able to understand and use Dyspnea scale;Able to understand and use rate of perceived exertion (RPE) scale;Knowledge and understanding of Target Heart Rate Range (THRR);Understanding of Exercise Prescription  Increase Strength and Stamina;Increase Physical Activity;Able to understand and use Dyspnea scale;Able to understand and use rate of perceived exertion (RPE) scale;Knowledge and understanding of Target Heart Rate Range (THRR);Understanding of Exercise Prescription  Increase Strength and Stamina;Increase Physical Activity;Able to understand and use Dyspnea scale;Able to understand and use rate of perceived exertion (RPE) scale;Knowledge and understanding of Target Heart Rate Range (THRR);Understanding of Exercise Prescription    Comments  Patient has only attended 2 sessions. Will cont. to monitor and progress as able.  Patient is progressing well in the program. She is still smoking which we have discussed. Home exercise has been completed. Large emphasis on pursed lip breathing with patient since she does tend to waver in the low 90's and high 80's for oxygen saturation. Patient is able to walk 21 laps (200 ft each)  in 15 minutes.   Patient is still smoking. She has purchased a fitbit to monitor her steps and aerobic activity. She is exhibiting more initiative to make lifestyle changes. Is able to walk up to 22 laps (200 ft each) in 15 minutes.   Patient has quit smoking--monitoring daily steps. Is able to walk up to 22 laps (200 ft each) in 15 minutes. Will cont. to encourage patient to exercise at home and stay smoke free.    Expected Outcomes  Through exercise at rehab and at home, patient will increase strength and stamina making ADL's easier to perform. Patient will also have a better understanding of safe exercise and what they are capable to do outside of clinical supervision.  Through exercise at rehab and at home, patient will increase strength and stamina and be less short of breath with ADL's at home.   Through exercise at rehab and at home, patient will increase strength and stamina and be less short of breath with ADL's at home.   Through  exercise at rehab and at home, patient will increase strength and stamina and ADL's will be easier to perform at home.        Discharge Exercise Prescription (Final Exercise Prescription Changes): Exercise Prescription Changes - 12/30/17 1200      Response to Exercise   Blood Pressure (Admit)  102/68    Blood Pressure (Exercise)  136/80    Blood Pressure (Exit)  102/70    Heart Rate (Admit)  68 bpm    Heart Rate (Exercise)  95 bpm    Heart Rate (Exit)  79 bpm    Oxygen Saturation (Admit)  95 %    Oxygen Saturation (Exercise)  91 %    Oxygen Saturation (Exit)  93 %    Rating of Perceived Exertion (Exercise)  12    Perceived Dyspnea (Exercise)  1    Duration  Progress to 45 minutes of aerobic exercise without signs/symptoms of physical distress    Intensity  THRR unchanged      Progression   Progression  Continue to progress workloads to maintain intensity without signs/symptoms of physical distress.      Resistance Training   Training Prescription  Yes     Weight  blue bands    Reps  10-15    Time  10 Minutes      Bike   Level  5    Minutes  17      NuStep   Level  5    Minutes  17    METs  2.3      Track   Laps  17    Minutes  17       Nutrition:  Target Goals: Understanding of nutrition guidelines, daily intake of sodium <159m, cholesterol <2034m calories 30% from fat and 7% or less from saturated fats, daily to have 5 or more servings of fruits and vegetables.  Biometrics: Pre Biometrics - 09/26/17 1053      Pre Biometrics   Grip Strength  32 kg        Nutrition Therapy Plan and Nutrition Goals: Nutrition Therapy & Goals - 10/10/17 1426      Nutrition Therapy   Diet  General, Healthful      Personal Nutrition Goals   Nutrition Goal  The pt will recognize symptoms that can interfere with adequate oral intake, such as shortness of breath, N/V, early satiety, fatigue, ability to secure and prepare food, taste and smell changes, chewing/swallowing difficulties, and/ or pain when eating.    Personal Goal #2  Identify food quantities necessary to achieve wt loss of  -2# per week to a goal wt loss of 6-20 lb at graduation from pulmonary rehab.      Intervention Plan   Intervention  Prescribe, educate and counsel regarding individualized specific dietary modifications aiming towards targeted core components such as weight, hypertension, lipid management, diabetes, heart failure and other comorbidities.    Expected Outcomes  Short Term Goal: Understand basic principles of dietary content, such as calories, fat, sodium, cholesterol and nutrients.;Long Term Goal: Adherence to prescribed nutrition plan.       Nutrition Assessments: Nutrition Assessments - 10/13/17 1521      Rate Your Plate Scores   Pre Score  45       Nutrition Goals Re-Evaluation:   Nutrition Goals Discharge (Final Nutrition Goals Re-Evaluation):   Psychosocial: Target Goals: Acknowledge presence or absence of significant depression and/or  stress, maximize coping skills, provide positive support system. Participant is able  to verbalize types and ability to use techniques and skills needed for reducing stress and depression.  Initial Review & Psychosocial Screening: Initial Psych Review & Screening - 09/26/17 1121      Initial Review   Current issues with  Current Depression;Current Stress Concerns only related to current smoking    Comments  related to smoking and unability to remain cigarette free      [place]?  Yes    Concerns  No support system    Comments  as it related to smoking. she cannot tell her family that she still smokes and it bothers her to the point she smokes and crys at the same time.      Barriers   Psychosocial barriers to participate in program  The patient should benefit from training in stress management and relaxation.      Screening Interventions   Interventions  Encouraged to exercise;To provide support and resources with identified psychosocial needs       Quality of Life Scores:  Scores of 19 and below usually indicate a poorer quality of life in these areas.  A difference of  2-3 points is a clinically meaningful difference.  A difference of 2-3 points in the total score of the Quality of Life Index has been associated with significant improvement in overall quality of life, self-image, physical symptoms, and general health in studies assessing change in quality of life.   PHQ-9: Recent Review Flowsheet Data    Depression screen [facility] 2/9 09/26/2017 09/17/2016   Decreased Interest 0 0   Down, Depressed, Hopeless 3  0   PHQ - 2 Score 3 0   Altered sleeping 0 -   Tired, decreased energy 0 -   Feeling bad or failure about yourself  3  -   Trouble concentrating 0 -   Moving slowly or fidgety/restless 0 -   Suicidal thoughts 0 -   PHQ-9 Score 6 -   Difficult doing work/chores Not difficult at all -     Interpretation of Total Score  Total Score Depression  Severity:  1-4 = Minimal depression, 5-9 = Mild depression, 10-14 = Moderate depression, 15-19 = Moderately severe depression, 20-27 = Severe depression   Psychosocial Evaluation and Intervention: Psychosocial Evaluation - 09/26/17 1127      Psychosocial Evaluation & Interventions   Interventions  Encouraged to exercise with the program and follow exercise prescription    Continue Psychosocial Services   Follow up required by staff       Psychosocial Re-Evaluation: Psychosocial Re-Evaluation    [place] Name 10/23/17 0721 11/17/17 1204 12/09/17 0700 01/05/18 0858       Psychosocial Re-Evaluation   Current issues with  Current Depression;Current Stress Concerns  Current Depression;Current Stress Concerns  Current Depression;Current Stress Concerns  None Identified    Comments  related to smoking  her stress and depression is related to the fact that she has failed so many attempts to quit smoking.  her stress and depression is related to the fact that she has failed so many attempts to quit smoking. should see an improvement if she remains cigarette free  patient states her stress has been lifted now that she has quit smoking and does not have to "hide" her smoking any longer    Expected Outcomes  patient will remain free from psychosocial barriers to participation in pulmonary rehab.  patient will remain free from psychosocial barriers to participation in pulmonary rehab.  patient will  remain free from psychosocial barriers to participation in pulmonary rehab.  patient will remain free from psychosocial barriers to participation in pulmonary rehab.    Interventions  -  Encouraged to attend Pulmonary Rehabilitation for the exercise  Encouraged to attend Pulmonary Rehabilitation for the exercise  Encouraged to attend Pulmonary Rehabilitation for the exercise    Continue Psychosocial Services   Follow up required by staff  Follow up required by staff  Follow up required by staff  Follow up required by  staff    Comments  related to smoking and unability to remain cigarette free  -  related to smoking and unability to remain cigarette free  -      Initial Review   Source of Stress Concerns  -  -  -  None Identified       Psychosocial Discharge (Final Psychosocial Re-Evaluation): Psychosocial Re-Evaluation - 01/05/18 0858      Psychosocial Re-Evaluation   Current issues with  None Identified    Comments  patient states her stress has been lifted now that she has quit smoking and does not have to "hide" her smoking any longer    Expected Outcomes  patient will remain free from psychosocial barriers to participation in pulmonary rehab.    Interventions  Encouraged to attend Pulmonary Rehabilitation for the exercise    Continue Psychosocial Services   Follow up required by staff      Initial Review   Source of Stress Concerns  None Identified       Education: Education Goals: Education classes will be provided on a weekly basis, covering required topics. Participant will state understanding/return demonstration of topics presented.  Learning Barriers/Preferences: Learning Barriers/Preferences - 09/26/17 1118      Learning Barriers/Preferences   Learning Barriers  None    Learning Preferences  Computer/Internet;Individual Instruction;Verbal Instruction;Written Material;Video;Skilled Demonstration       Education Topics: Risk Factor Reduction:  -Group instruction that is supported by a PowerPoint presentation. Instructor discusses the definition of a risk factor, different risk factors for pulmonary disease, and how the heart and lungs work together.     Nutrition for Pulmonary Patient:  -Group instruction provided by PowerPoint slides, verbal discussion, and written materials to support subject matter. The instructor gives an explanation and review of healthy diet recommendations, which includes a discussion on weight management, recommendations for fruit and vegetable  consumption, as well as protein, fluid, caffeine, fiber, sodium, sugar, and alcohol. Tips for eating when patients are short of breath are discussed.   Pursed Lip Breathing:  -Group instruction that is supported by demonstration and informational handouts. Instructor discusses the benefits of pursed lip and diaphragmatic breathing and detailed demonstration on how to preform both.     Oxygen Safety:  -Group instruction provided by PowerPoint, verbal discussion, and written material to support subject matter. There is an overview of "What is Oxygen" and "Why do we need it".  Instructor also reviews how to create a safe environment for oxygen use, the importance of using oxygen as prescribed, and the risks of noncompliance. There is a brief discussion on traveling with oxygen and resources the patient may utilize.   PULMONARY REHAB OTHER RESPIRATORY from 01/01/2018 in [place]  Date  12/04/17  Educator  Truddie Crumble  Instruction Review Code  2- Demonstrated Understanding      Oxygen Equipment:  -Group instruction provided by [facility] Staff utilizing handouts, written materials, and equipment demonstrations.   PULMONARY REHAB OTHER RESPIRATORY  from 01/01/2018 in [place]  Date  12/11/17  Educator  Iona Beard from [place]  Instruction Review Code  1- Verbalizes Understanding      Signs and Symptoms:  -Group instruction provided by written material and verbal discussion to support subject matter. Warning signs and symptoms of infection, stroke, and heart attack are reviewed and when to call the physician/911 reinforced. Tips for preventing the spread of infection discussed.   PULMONARY REHAB OTHER RESPIRATORY from 01/01/2018 in [place]  Date  12/25/17  Educator  RN  Instruction Review Code  1- Verbalizes Understanding      Advanced Directives:  -Group instruction provided by verbal instruction and  written material to support subject matter. Instructor reviews Advanced Directive laws and proper instruction for filling out document.   Pulmonary Video:  -Group video education that reviews the importance of medication and oxygen compliance, exercise, good nutrition, pulmonary hygiene, and pursed lip and diaphragmatic breathing for the pulmonary patient.   Exercise for the Pulmonary Patient:  -Group instruction that is supported by a PowerPoint presentation. Instructor discusses benefits of exercise, core components of exercise, frequency, duration, and intensity of an exercise routine, importance of utilizing pulse oximetry during exercise, safety while exercising, and options of places to exercise outside of rehab.     PULMONARY REHAB OTHER RESPIRATORY from 01/01/2018 in [place]  Date  12/18/17  Educator  Cloyde Reams  Instruction Review Code  2- Demonstrated Understanding      Pulmonary Medications:  -Verbally interactive group education provided by instructor with focus on inhaled medications and proper administration.   PULMONARY REHAB OTHER RESPIRATORY from 01/01/2018 in [place]  Date  11/13/17  Educator  pharm      Anatomy and Physiology of the Respiratory System and Intimacy:  -Group instruction provided by PowerPoint, verbal discussion, and written material to support subject matter. Instructor reviews respiratory cycle and anatomical components of the respiratory system and their functions. Instructor also reviews differences in obstructive and restrictive respiratory diseases with examples of each. Intimacy, Sex, and Sexuality differences are reviewed with a discussion on how relationships can change when diagnosed with pulmonary disease. Common sexual concerns are reviewed.   PULMONARY REHAB OTHER RESPIRATORY from 01/01/2018 in [place]  Date  10/16/17  Educator  RN      MD DAY -A  group question and answer session with a medical doctor that allows participants to ask questions that relate to their pulmonary disease state.   PULMONARY REHAB OTHER RESPIRATORY from 01/01/2018 in [place]  Date  11/06/17  Educator  yacoub      OTHER EDUCATION -Group or individual verbal, written, or video instructions that support the educational goals of the pulmonary rehab program.   PULMONARY REHAB OTHER RESPIRATORY from 01/01/2018 in [place]  Date  11/20/17  Educator  EP  Instruction Review Code  1- Verbalizes Understanding      Holiday Eating Survival Tips:  -Group instruction provided by PowerPoint slides, verbal discussion, and written materials to support subject matter. The instructor gives patients tips, tricks, and techniques to help them not only survive but enjoy the holidays despite the onslaught of food that accompanies the holidays.   Knowledge Questionnaire Score: Knowledge Questionnaire Score - 09/26/17 1118      Knowledge Questionnaire Score   Pre Score  12/13  Core Components/Risk Factors/Patient Goals at Admission: Personal Goals and Risk Factors at Admission - 09/26/17 1119      Core Components/Risk Factors/Patient Goals on Admission   Tobacco Cessation  Yes    Number of packs per day  1 pack per 2 days. given "fake" cigarette, quit smart handout, started welbutrin Monday, awaiting for therapist appointment from primary MD.  she has successfully quit smoking multiple times and remained cigarette free for up to 6 months at a time.    Intervention  Assist the participant in steps to quit. Provide individualized education and counseling about committing to Tobacco Cessation, relapse prevention, and pharmacological support that can be provided by physician.;Advice worker, assist with locating and accessing local/national Quit Smoking programs, and support quit date choice.     Expected Outcomes  Short Term: Will demonstrate readiness to quit, by selecting a quit date.;Short Term: Will quit all tobacco product use, adhering to prevention of relapse plan.;Long Term: Complete abstinence from all tobacco products for at least 12 months from quit date.    Improve shortness of breath with ADL's  Yes    Intervention  Provide education, individualized exercise plan and daily activity instruction to help decrease symptoms of SOB with activities of daily living.    Expected Outcomes  Short Term: Achieves a reduction of symptoms when performing activities of daily living.    Develop more efficient breathing techniques such as purse lipped breathing and diaphragmatic breathing; and practicing self-pacing with activity  Yes    Intervention  Provide education, demonstration and support about specific breathing techniuqes utilized for more efficient breathing. Include techniques such as pursed lipped breathing, diaphragmatic breathing and self-pacing activity.    Expected Outcomes  Short Term: Participant will be able to demonstrate and use breathing techniques as needed throughout daily activities.    Increase knowledge of respiratory medications and ability to use respiratory devices properly   Yes    Intervention  Provide education and demonstration as needed of appropriate use of medications, inhalers, and oxygen therapy.    Expected Outcomes  Short Term: Achieves understanding of medications use. Understands that oxygen is a medication prescribed by physician. Demonstrates appropriate use of inhaler and oxygen therapy.       Core Components/Risk Factors/Patient Goals Review:  Goals and Risk Factor Review    Row Name 10/23/17 0718 11/17/17 1159 12/09/17 0658 01/02/18 1633       Core Components/Risk Factors/Patient Goals Review   Personal Goals Review  Increase knowledge of respiratory medications and ability to use respiratory devices properly.;Improve shortness of breath with  ADL's;Develop more efficient breathing techniques such as purse lipped breathing and diaphragmatic breathing and practicing self-pacing with activity.;Tobacco Cessation  Increase knowledge of respiratory medications and ability to use respiratory devices properly.;Improve shortness of breath with ADL's;Develop more efficient breathing techniques such as purse lipped breathing and diaphragmatic breathing and practicing self-pacing with activity.;Tobacco Cessation  Increase knowledge of respiratory medications and ability to use respiratory devices properly.;Improve shortness of breath with ADL's;Develop more efficient breathing techniques such as purse lipped breathing and diaphragmatic breathing and practicing self-pacing with activity.;Tobacco Cessation  Increase knowledge of respiratory medications and ability to use respiratory devices properly.;Improve shortness of breath with ADL's;Develop more efficient breathing techniques such as purse lipped breathing and diaphragmatic breathing and practicing self-pacing with activity.;Tobacco Cessation    Review  Patient is already tolerating workload increases in pulmonary rehab. She continues to smoke but is contemplating speaking with her PCP about medications to help her  stop. She states she still has her quit smart information. It is too soon in the program to evaluate progression towards other goals. Expect to see progress over the next 30 days.   Patient is progressing well in the program. She continues to tolerate workload increases and always pushes herself on the track for an RPE of 11-13. She has "cut back" on her cigarette smoking although she has not contacted the [facility] program. She is observed utilizing PLB for her SOB with exertion. She recently attended an education session with the pharmacist on pulmonary medications including inhaled medications.  continues to progress very well in pulmonary rehab. At her last session patient stated she had smoked  her last cigarette and had no plans to buy any more. Will follow up with her today. she continues to use PLB and states it has been the most effective tool that has helped with her shortness of breath.  patient continues to progress in pulmonary rehab and has recently quit smoking as of 3 weeks ago. she is so proud of herself is now "challenging herself" to see how many days she can go without smoking. she feels this mindset, which is different than the mindset she has had in the past, will help her remain an exsmoker. she has quit multiple times only to pick up smoking again. this frustrates her however she is determined to accept her own challenge this time. since she quit smoking she states her shortness of breath has improved even more. she purse lip breaths with all exertional activities and exercise. she is scheduled to graduate on 3/21. she has met her personal goals.    Expected Outcomes  see "admission expected outcomes"  see "admission expected outcomes"  see "admission expected outcomes"  see "admission expected outcomes"       Core Components/Risk Factors/Patient Goals at Discharge (Final Review):  Goals and Risk Factor Review - 01/02/18 1633      Core Components/Risk Factors/Patient Goals Review   Personal Goals Review  Increase knowledge of respiratory medications and ability to use respiratory devices properly.;Improve shortness of breath with ADL's;Develop more efficient breathing techniques such as purse lipped breathing and diaphragmatic breathing and practicing self-pacing with activity.;Tobacco Cessation    Review  patient continues to progress in pulmonary rehab and has recently quit smoking as of 3 weeks ago. she is so proud of herself is now "challenging herself" to see how many days she can go without smoking. she feels this mindset, which is different than the mindset she has had in the past, will help her remain an exsmoker. she has quit multiple times only to pick up smoking  again. this frustrates her however she is determined to accept her own challenge this time. since she quit smoking she states her shortness of breath has improved even more. she purse lip breaths with all exertional activities and exercise. she is scheduled to graduate on 3/21. she has met her personal goals.    Expected Outcomes  see "admission expected outcomes"       ITP Comments:   Comments: patient has attended 20 pulmonary rehab sessions since admission.

## 2018-01-06 ENCOUNTER — Encounter (HOSPITAL_COMMUNITY)
Admission: RE | Admit: 2018-01-06 | Discharge: 2018-01-06 | Disposition: A | Discharge status: Home / Self Care | Payer: Medicare Other | Source: Ambulatory Visit | Attending: Pulmonary Disease | Admitting: Pulmonary Disease

## 2018-01-06 DIAGNOSIS — J439 Emphysema, unspecified: Secondary | ICD-10-CM

## 2018-01-06 NOTE — Progress Notes (Signed)
Daily Session Note  Patient Details  Name: Lori Day MRN: [id number] Date of Birth: [birth year] Referring Provider:     Pulmonary Rehab Walk Test from 09/30/2017 in [place]  Referring Provider  Dr. Lenna Gilford      Encounter Date: 01/06/2018  Check In: Session Check In - 01/06/18 1241      Check-In   Location  [facility]    Staff Present  Trish Fountain, RN, BSN;Molly diVincenzo, MS, ACSM RCEP, Exercise Physiologist;Vihaan Gloss Ysidro Evert, RN    Supervising physician immediately available to respond to emergencies  [facility] immediately available    Physician(s)  Dr. Horris Latino    Medication changes reported      No    Fall or balance concerns reported     No    Tobacco Cessation  No Change    Warm-up and Cool-down  Performed as group-led instruction    Resistance Training Performed  Yes    VAD Patient?  No      Pain Assessment   Currently in Pain?  No/denies    Multiple Pain Sites  No       Capillary Blood Glucose: No results found for this or any previous visit (from the past 24 hour(s)).    Social History   Tobacco Use  Smoking Status Former Smoker  . Packs/day: 1.00  . Years: 50.00  . Pack years: 50.00  . Types: Cigarettes  . Last attempt to quit: 11/28/2017  . Years since quitting: 0.1  Smokeless Tobacco Never Used    Goals Met:  Exercise tolerated well No report of cardiac concerns or symptoms Strength training completed today  Goals Unmet:  Not Applicable  Comments: Service time is from 1030 to 1230    Dr. Rush Farmer is Medical Director for Pulmonary Rehab at [facility].

## 2018-01-08 ENCOUNTER — Encounter (HOSPITAL_COMMUNITY)
Admission: RE | Admit: 2018-01-08 | Discharge: 2018-01-08 | Disposition: A | Discharge status: Home / Self Care | Payer: Medicare Other | Source: Ambulatory Visit | Attending: Pulmonary Disease | Admitting: Pulmonary Disease

## 2018-01-08 DIAGNOSIS — J439 Emphysema, unspecified: Secondary | ICD-10-CM | POA: Diagnosis not present

## 2018-01-12 NOTE — Progress Notes (Signed)
Daily Session Note  Patient Details  Name: Lori Day MRN: [id number] Date of Birth: [birth year] Referring Provider:     Pulmonary Rehab Walk Test from 09/30/2017 in [place]  Referring Provider  Dr. Lenna Gilford      Encounter Date: 01/08/2018  Check In:   Capillary Blood Glucose: No results found for this or any previous visit (from the past 24 hour(s)).    Social History   Tobacco Use  Smoking Status Former Smoker  . Packs/day: 1.00  . Years: 50.00  . Pack years: 50.00  . Types: Cigarettes  . Last attempt to quit: 11/28/2017  . Years since quitting: 0.1  Smokeless Tobacco Never Used    Goals Met:  Independence with exercise equipment Improved SOB with ADL's Using PLB without cueing & demonstrates good technique Exercise tolerated well Personal goals reviewed No report of cardiac concerns or symptoms Strength training completed today  Goals Unmet:  Not Applicable  Comments: Service time is from 1030 to 1240   Dr. Rush Farmer is Medical Director for Pulmonary Rehab at [facility].

## 2018-01-13 ENCOUNTER — Encounter (HOSPITAL_COMMUNITY)
Admission: RE | Admit: 2018-01-13 | Discharge: 2018-01-13 | Disposition: A | Discharge status: Home / Self Care | Payer: Medicare Other | Source: Ambulatory Visit | Attending: Pulmonary Disease | Admitting: Pulmonary Disease

## 2018-01-13 VITALS — Wt 201.5 lb

## 2018-01-13 DIAGNOSIS — J439 Emphysema, unspecified: Secondary | ICD-10-CM

## 2018-01-13 NOTE — Progress Notes (Signed)
Daily Session Note  Patient Details  Name: Lori Day MRN: [id number] Date of Birth: 1945/11/11 Referring Provider:     Pulmonary Rehab Walk Test from 09/30/2017 in [place]  Referring Provider  Dr. Lenna Gilford      Encounter Date: 01/13/2018  Check In: Session Check In - 01/13/18 1030      Check-In   Location  [facility]    Staff Present  Trish Fountain, RN, BSN;Molly diVincenzo, MS, ACSM RCEP, Exercise Physiologist;Joan Leonia Reeves, RN, BSN    Supervising physician immediately available to respond to emergencies  [facility] immediately available    Physician(s)  Dr. Wendee Beavers    Medication changes reported      No    Fall or balance concerns reported     No    Tobacco Cessation  No Change    Resistance Training Performed  Yes    VAD Patient?  No      Pain Assessment   Currently in Pain?  No/denies    Multiple Pain Sites  No       Capillary Blood Glucose: No results found for this or any previous visit (from the past 24 hour(s)).    Social History   Tobacco Use  Smoking Status Former Smoker  . Packs/day: 1.00  . Years: 50.00  . Pack years: 50.00  . Types: Cigarettes  . Last attempt to quit: 11/28/2017  . Years since quitting: 0.1  Smokeless Tobacco Never Used    Goals Met:  Independence with exercise equipment Improved SOB with ADL's Using PLB without cueing & demonstrates good technique Exercise tolerated well No report of cardiac concerns or symptoms Strength training completed today  Goals Unmet:  Not Applicable  Comments: Service time is from 1030 to 1210   Dr. Rush Farmer is Medical Director for Pulmonary Rehab at [facility].

## 2018-01-14 ENCOUNTER — Encounter (HOSPITAL_COMMUNITY): Payer: Self-pay | Admitting: *Deleted

## 2018-01-15 ENCOUNTER — Encounter (HOSPITAL_COMMUNITY)
Admission: RE | Admit: 2018-01-15 | Discharge: 2018-01-15 | Disposition: A | Discharge status: Home / Self Care | Payer: Medicare Other | Source: Ambulatory Visit | Attending: Pulmonary Disease | Admitting: Pulmonary Disease

## 2018-01-15 DIAGNOSIS — J439 Emphysema, unspecified: Secondary | ICD-10-CM | POA: Diagnosis not present

## 2018-01-15 NOTE — Progress Notes (Signed)
Daily Session Note  Patient Details  Name: Elina Streng MRN: [id number] Date of Birth: [birth year] Referring Provider:     Pulmonary Rehab Walk Test from 09/30/2017 in [place]  Referring Provider  Dr. Lenna Gilford      Encounter Date: 01/13/2018  Check In: Session Check In - 01/15/18 1030      Check-In   Location  [facility]    Staff Present  Rosebud Poles, RN, BSN;Porschea Borys, MS, ACSM RCEP, Exercise Physiologist;Portia Rollene Rotunda, RN, Roque Cash, RN    Supervising physician immediately available to respond to emergencies  [facility] immediately available    Physician(s)  Dr. Lonny Prude    Medication changes reported      No    Fall or balance concerns reported     No    Tobacco Cessation  No Change    Warm-up and Cool-down  Performed as group-led instruction    Resistance Training Performed  Yes    VAD Patient?  No      Pain Assessment   Currently in Pain?  No/denies    Multiple Pain Sites  No       Capillary Blood Glucose: No results found for this or any previous visit (from the past 24 hour(s)).    Social History   Tobacco Use  Smoking Status Former Smoker  . Packs/day: 1.00  . Years: 50.00  . Pack years: 50.00  . Types: Cigarettes  . Last attempt to quit: 11/28/2017  . Years since quitting: 0.1  Smokeless Tobacco Never Used    Goals Met:  Exercise tolerated well No report of cardiac concerns or symptoms Strength training completed today  Goals Unmet:  Not Applicable  Comments: Service time is from 10:30a to 12:10p    Dr. Rush Farmer is Medical Director for Pulmonary Rehab at [facility].

## 2018-01-16 ENCOUNTER — Encounter (HOSPITAL_COMMUNITY): Payer: Self-pay

## 2018-01-16 DIAGNOSIS — J439 Emphysema, unspecified: Secondary | ICD-10-CM

## 2018-01-16 NOTE — Progress Notes (Signed)
Discharge Progress Report  Patient Details  Name: Lori Day MRN: [id number] Date of Birth: February 10, 1946 Referring Provider:     Pulmonary Rehab Walk Test from 09/30/2017 in [place]  Referring Provider  Dr. Lenna Gilford       Number of Visits: 24  Reason for Discharge:  Patient reached a stable level of exercise. Patient independent in their exercise. Patient has met program and personal goals.  Smoking History:  Social History   Tobacco Use  Smoking Status Former Smoker  . Packs/day: 1.00  . Years: 50.00  . Pack years: 50.00  . Types: Cigarettes  . Last attempt to quit: 11/28/2017  . Years since quitting: 0.1  Smokeless Tobacco Never Used    Diagnosis:  Pulmonary emphysema, unspecified emphysema type ([place])  ADL UCSD: Pulmonary Assessment Scores    Row Name 01/14/18 1208         ADL UCSD   ADL Phase  Exit     SOB Score total  5       CAT Score   CAT Score  7 Exit        Initial Exercise Prescription:   Discharge Exercise Prescription (Final Exercise Prescription Changes): Exercise Prescription Changes - 01/13/18 1421      Response to Exercise   Blood Pressure (Admit)  102/64    Blood Pressure (Exercise)  106/60    Blood Pressure (Exit)  104/60    Heart Rate (Admit)  74 bpm    Heart Rate (Exercise)  114 bpm    Heart Rate (Exit)  97 bpm    Oxygen Saturation (Admit)  93 %    Oxygen Saturation (Exercise)  88 %    Oxygen Saturation (Exit)  96 %    Rating of Perceived Exertion (Exercise)  13    Perceived Dyspnea (Exercise)  2    Duration  Progress to 45 minutes of aerobic exercise without signs/symptoms of physical distress    Intensity  THRR unchanged      Progression   Progression  Continue to progress workloads to maintain intensity without signs/symptoms of physical distress.      Resistance Training   Training Prescription  Yes    Weight  blue bands    Reps  10-15      Recumbant Bike   Level  6    Minutes  17       NuStep   Level  5    SPM  80    Minutes  17    METs  3.6      Track   Laps  22    Minutes  17       Functional Capacity: 6 Minute Walk    Row Name 01/15/18 1613         6 Minute Walk   Phase  Discharge     Distance  1900 feet     Distance Feet Change  490 ft     Walk Time  6 minutes     # of Rest Breaks  0     MPH  3.59     METS  3.76     RPE  11     Perceived Dyspnea   1     Symptoms  Yes (comment)     Comments  fingernails painted dark green--oxygen saturation not reliable.     Resting HR  102 bpm     Resting BP  100/60     Resting Oxygen Saturation  90 %     Exercise Oxygen Saturation  during 6 min walk  81 %     Max Ex. HR  118 bpm     Max Ex. BP  158/60        Psychological, QOL, Others - Outcomes: PHQ 2/9: Depression screen [facility] 2/9 09/26/2017 09/17/2016  Decreased Interest 0 0  Down, Depressed, Hopeless 3 0  PHQ - 2 Score 3 0  Altered sleeping 0 -  Tired, decreased energy 0 -  Feeling bad or failure about yourself  3 -  Trouble concentrating 0 -  Moving slowly or fidgety/restless 0 -  Suicidal thoughts 0 -  PHQ-9 Score 6 -  Difficult doing work/chores Not difficult at all -    Quality of Life:   Personal Goals: Goals established at orientation with interventions provided to work toward goal.    Personal Goals Discharge: Goals and Risk Factor Review    Row Name 11/17/17 1159 12/09/17 0658 01/02/18 1633 01/16/18 1203       Core Components/Risk Factors/Patient Goals Review   Personal Goals Review  Increase knowledge of respiratory medications and ability to use respiratory devices properly.;Improve shortness of breath with ADL's;Develop more efficient breathing techniques such as purse lipped breathing and diaphragmatic breathing and practicing self-pacing with activity.;Tobacco Cessation  Increase knowledge of respiratory medications and ability to use respiratory devices properly.;Improve shortness of breath with ADL's;Develop more  efficient breathing techniques such as purse lipped breathing and diaphragmatic breathing and practicing self-pacing with activity.;Tobacco Cessation  Increase knowledge of respiratory medications and ability to use respiratory devices properly.;Improve shortness of breath with ADL's;Develop more efficient breathing techniques such as purse lipped breathing and diaphragmatic breathing and practicing self-pacing with activity.;Tobacco Cessation  -    Review  Patient is progressing well in the program. She continues to tolerate workload increases and always pushes herself on the track for an RPE of 11-13. She has "cut back" on her cigarette smoking although she has not contacted the [facility] program. She is observed utilizing PLB for her SOB with exertion. She recently attended an education session with the pharmacist on pulmonary medications including inhaled medications.  continues to progress very well in pulmonary rehab. At her last session patient stated she had smoked her last cigarette and had no plans to buy any more. Will follow up with her today. she continues to use PLB and states it has been the most effective tool that has helped with her shortness of breath.  patient continues to progress in pulmonary rehab and has recently quit smoking as of 3 weeks ago. she is so proud of herself is now "challenging herself" to see how many days she can go without smoking. she feels this mindset, which is different than the mindset she has had in the past, will help her remain an exsmoker. she has quit multiple times only to pick up smoking again. this frustrates her however she is determined to accept her own challenge this time. since she quit smoking she states her shortness of breath has improved even more. she purse lip breaths with all exertional activities and exercise. she is scheduled to graduate on 3/21. she has met her personal goals.  patient met her all of her goals however she started smoking again 2  weeks ago. She continues to struggle with this addicition and states it is her "go to" when she is stressed or anxious. Encouraged patient to continue to use smoking cessation tools given during rehab.  Expected Outcomes  see "admission expected outcomes"  see "admission expected outcomes"  see "admission expected outcomes"  see "admission expected outcomes"       Exercise Goals and Review:   Nutrition & Weight - Outcomes:    Nutrition:   Nutrition Discharge:   Education Questionnaire Score: Knowledge Questionnaire Score - 01/14/18 1208      Knowledge Questionnaire Score   Post Score  13/13       Goals reviewed with patient; Patient plans to continue to exercise at local gym with friend.

## 2018-01-20 ENCOUNTER — Encounter (HOSPITAL_COMMUNITY): Payer: Medicare Other

## 2018-01-22 ENCOUNTER — Encounter (HOSPITAL_COMMUNITY): Payer: Medicare Other

## 2018-03-17 DIAGNOSIS — M9905 Segmental and somatic dysfunction of pelvic region: Secondary | ICD-10-CM | POA: Diagnosis not present

## 2018-03-17 DIAGNOSIS — M9902 Segmental and somatic dysfunction of thoracic region: Secondary | ICD-10-CM | POA: Diagnosis not present

## 2018-03-17 DIAGNOSIS — M9903 Segmental and somatic dysfunction of lumbar region: Secondary | ICD-10-CM | POA: Diagnosis not present

## 2018-03-17 DIAGNOSIS — M9901 Segmental and somatic dysfunction of cervical region: Secondary | ICD-10-CM | POA: Diagnosis not present

## 2018-03-22 ENCOUNTER — Other Ambulatory Visit: Payer: Self-pay | Admitting: Pulmonary Disease

## 2018-03-24 DIAGNOSIS — M9902 Segmental and somatic dysfunction of thoracic region: Secondary | ICD-10-CM | POA: Diagnosis not present

## 2018-03-24 DIAGNOSIS — M9903 Segmental and somatic dysfunction of lumbar region: Secondary | ICD-10-CM | POA: Diagnosis not present

## 2018-03-24 DIAGNOSIS — M9905 Segmental and somatic dysfunction of pelvic region: Secondary | ICD-10-CM | POA: Diagnosis not present

## 2018-03-24 DIAGNOSIS — M9901 Segmental and somatic dysfunction of cervical region: Secondary | ICD-10-CM | POA: Diagnosis not present

## 2018-03-31 DIAGNOSIS — M9905 Segmental and somatic dysfunction of pelvic region: Secondary | ICD-10-CM | POA: Diagnosis not present

## 2018-03-31 DIAGNOSIS — M9903 Segmental and somatic dysfunction of lumbar region: Secondary | ICD-10-CM | POA: Diagnosis not present

## 2018-03-31 DIAGNOSIS — M9901 Segmental and somatic dysfunction of cervical region: Secondary | ICD-10-CM | POA: Diagnosis not present

## 2018-03-31 DIAGNOSIS — M9902 Segmental and somatic dysfunction of thoracic region: Secondary | ICD-10-CM | POA: Diagnosis not present

## 2018-04-01 ENCOUNTER — Telehealth: Payer: Self-pay | Admitting: Pulmonary Disease

## 2018-04-01 MED ORDER — TIOTROPIUM BROMIDE-OLODATEROL 2.5-2.5 MCG/ACT IN AERS: 2.0000 | INHALATION_SPRAY | Freq: Every day | RESPIRATORY_TRACT | 4 refills | Status: DC

## 2018-04-01 NOTE — Telephone Encounter (Signed)
Spoke with pt. She is requesting a refill on Stiolto. Rx has been sent in. Nothing further was needed.

## 2018-04-23 DIAGNOSIS — K219 Gastro-esophageal reflux disease without esophagitis: Secondary | ICD-10-CM | POA: Diagnosis not present

## 2018-04-23 DIAGNOSIS — E559 Vitamin D deficiency, unspecified: Secondary | ICD-10-CM | POA: Diagnosis not present

## 2018-04-23 DIAGNOSIS — Z720 Tobacco use: Secondary | ICD-10-CM | POA: Diagnosis not present

## 2018-04-23 DIAGNOSIS — H9190 Unspecified hearing loss, unspecified ear: Secondary | ICD-10-CM | POA: Diagnosis not present

## 2018-04-23 DIAGNOSIS — Z136 Encounter for screening for cardiovascular disorders: Secondary | ICD-10-CM | POA: Diagnosis not present

## 2018-04-23 DIAGNOSIS — Z1159 Encounter for screening for other viral diseases: Secondary | ICD-10-CM | POA: Diagnosis not present

## 2018-04-23 DIAGNOSIS — M810 Age-related osteoporosis without current pathological fracture: Secondary | ICD-10-CM | POA: Diagnosis not present

## 2018-04-23 DIAGNOSIS — E7800 Pure hypercholesterolemia, unspecified: Secondary | ICD-10-CM | POA: Diagnosis not present

## 2018-04-23 DIAGNOSIS — Z0000 Encounter for general adult medical examination without abnormal findings: Secondary | ICD-10-CM | POA: Diagnosis not present

## 2018-04-23 DIAGNOSIS — F3130 Bipolar disorder, current episode depressed, mild or moderate severity, unspecified: Secondary | ICD-10-CM | POA: Diagnosis not present

## 2018-04-28 DIAGNOSIS — H40013 Open angle with borderline findings, low risk, bilateral: Secondary | ICD-10-CM | POA: Diagnosis not present

## 2018-04-28 DIAGNOSIS — Z961 Presence of intraocular lens: Secondary | ICD-10-CM | POA: Diagnosis not present

## 2018-05-26 DIAGNOSIS — H9202 Otalgia, left ear: Secondary | ICD-10-CM | POA: Diagnosis not present

## 2018-05-26 DIAGNOSIS — L299 Pruritus, unspecified: Secondary | ICD-10-CM | POA: Diagnosis not present

## 2018-06-09 DIAGNOSIS — H9113 Presbycusis, bilateral: Secondary | ICD-10-CM | POA: Diagnosis not present

## 2018-06-09 DIAGNOSIS — Z923 Personal history of irradiation: Secondary | ICD-10-CM | POA: Diagnosis not present

## 2018-06-09 DIAGNOSIS — Z85819 Personal history of malignant neoplasm of unspecified site of lip, oral cavity, and pharynx: Secondary | ICD-10-CM | POA: Diagnosis not present

## 2018-06-09 DIAGNOSIS — K219 Gastro-esophageal reflux disease without esophagitis: Secondary | ICD-10-CM | POA: Diagnosis not present

## 2018-06-09 DIAGNOSIS — H903 Sensorineural hearing loss, bilateral: Secondary | ICD-10-CM | POA: Diagnosis not present

## 2018-06-09 DIAGNOSIS — H9190 Unspecified hearing loss, unspecified ear: Secondary | ICD-10-CM | POA: Diagnosis not present

## 2018-06-21 ENCOUNTER — Other Ambulatory Visit: Payer: Self-pay | Admitting: Pulmonary Disease

## 2018-06-24 ENCOUNTER — Ambulatory Visit (INDEPENDENT_AMBULATORY_CARE_PROVIDER_SITE_OTHER): Payer: Medicare Other | Admitting: Pulmonary Disease

## 2018-06-24 ENCOUNTER — Encounter: Payer: Self-pay | Admitting: Pulmonary Disease

## 2018-06-24 ENCOUNTER — Ambulatory Visit (INDEPENDENT_AMBULATORY_CARE_PROVIDER_SITE_OTHER)
Admission: RE | Admit: 2018-06-24 | Discharge: 2018-06-24 | Disposition: A | Discharge status: Home / Self Care | Payer: Medicare Other | Source: Ambulatory Visit | Attending: Pulmonary Disease | Admitting: Pulmonary Disease

## 2018-06-24 VITALS — BP 136/64 | HR 71 | Temp 98.0°F | Ht 68.5 in | Wt 192.6 lb

## 2018-06-24 DIAGNOSIS — J439 Emphysema, unspecified: Secondary | ICD-10-CM

## 2018-06-24 DIAGNOSIS — F17210 Nicotine dependence, cigarettes, uncomplicated: Secondary | ICD-10-CM

## 2018-06-24 DIAGNOSIS — C01 Malignant neoplasm of base of tongue: Secondary | ICD-10-CM

## 2018-06-24 DIAGNOSIS — R05 Cough: Secondary | ICD-10-CM | POA: Diagnosis not present

## 2018-06-24 DIAGNOSIS — G4734 Idiopathic sleep related nonobstructive alveolar hypoventilation: Secondary | ICD-10-CM

## 2018-06-24 MED ORDER — VARENICLINE TARTRATE 1 MG PO TABS: 1.0000 mg | ORAL_TABLET | Freq: Two times a day (BID) | ORAL | 5 refills | Status: DC

## 2018-06-24 NOTE — Patient Instructions (Signed)
Today we updated your med list in our EPIC system...    Continue your current medications the same...  We refilled your CHANTIX per request...  Stay as active as possible to continue your pulmonary rehab training...  Call for any questions or if we can be of service in any way...  We will set you up to see my young partner, Dr. Valeta Harms in about 6 months...    My best wishes to you & your family for the coming years!!!

## 2018-07-27 DIAGNOSIS — J069 Acute upper respiratory infection, unspecified: Secondary | ICD-10-CM | POA: Diagnosis not present

## 2018-07-30 DIAGNOSIS — H6692 Otitis media, unspecified, left ear: Secondary | ICD-10-CM | POA: Diagnosis not present

## 2018-08-11 DIAGNOSIS — M810 Age-related osteoporosis without current pathological fracture: Secondary | ICD-10-CM | POA: Diagnosis not present

## 2018-08-11 DIAGNOSIS — Z79899 Other long term (current) drug therapy: Secondary | ICD-10-CM | POA: Diagnosis not present

## 2018-08-11 DIAGNOSIS — Z1321 Encounter for screening for nutritional disorder: Secondary | ICD-10-CM | POA: Diagnosis not present

## 2018-08-11 DIAGNOSIS — E559 Vitamin D deficiency, unspecified: Secondary | ICD-10-CM | POA: Diagnosis not present

## 2018-08-11 DIAGNOSIS — Z8781 Personal history of (healed) traumatic fracture: Secondary | ICD-10-CM | POA: Diagnosis not present

## 2018-08-11 DIAGNOSIS — Z87891 Personal history of nicotine dependence: Secondary | ICD-10-CM | POA: Diagnosis not present

## 2018-08-17 DIAGNOSIS — H9202 Otalgia, left ear: Secondary | ICD-10-CM | POA: Diagnosis not present

## 2018-08-17 DIAGNOSIS — H9190 Unspecified hearing loss, unspecified ear: Secondary | ICD-10-CM | POA: Diagnosis not present

## 2018-08-17 DIAGNOSIS — F3130 Bipolar disorder, current episode depressed, mild or moderate severity, unspecified: Secondary | ICD-10-CM | POA: Diagnosis not present

## 2018-08-17 DIAGNOSIS — J449 Chronic obstructive pulmonary disease, unspecified: Secondary | ICD-10-CM | POA: Diagnosis not present

## 2018-08-17 DIAGNOSIS — R202 Paresthesia of skin: Secondary | ICD-10-CM | POA: Diagnosis not present

## 2018-08-18 ENCOUNTER — Encounter: Payer: Self-pay | Admitting: *Deleted

## 2018-08-19 ENCOUNTER — Encounter: Payer: Self-pay | Admitting: Diagnostic Neuroimaging

## 2018-08-19 ENCOUNTER — Ambulatory Visit (INDEPENDENT_AMBULATORY_CARE_PROVIDER_SITE_OTHER): Payer: Medicare Other | Admitting: Diagnostic Neuroimaging

## 2018-08-19 ENCOUNTER — Telehealth: Payer: Self-pay | Admitting: Diagnostic Neuroimaging

## 2018-08-19 VITALS — BP 115/62 | HR 65 | Ht 68.5 in | Wt 198.8 lb

## 2018-08-19 DIAGNOSIS — F419 Anxiety disorder, unspecified: Secondary | ICD-10-CM

## 2018-08-19 DIAGNOSIS — R252 Cramp and spasm: Secondary | ICD-10-CM | POA: Diagnosis not present

## 2018-08-19 DIAGNOSIS — F3175 Bipolar disorder, in partial remission, most recent episode depressed: Secondary | ICD-10-CM | POA: Diagnosis not present

## 2018-08-19 DIAGNOSIS — R200 Anesthesia of skin: Secondary | ICD-10-CM

## 2018-08-19 DIAGNOSIS — R413 Other amnesia: Secondary | ICD-10-CM

## 2018-08-19 NOTE — Telephone Encounter (Signed)
lvm for pt to be aware of this. Also gave her their number of [phone] and to call if she has not heard in the next 2-3 business days.

## 2018-08-19 NOTE — Telephone Encounter (Signed)
Medicare/bcbs fed order sent to [facility]. No auth they will reach out to the pt to schedule.

## 2018-08-19 NOTE — Progress Notes (Signed)
[facility]  PATIENT: Lori Day DOB: 01-07-1946  REFERRING CLINICIAN: Wilhemena Durie HISTORY FROM: patient  REASON FOR VISIT: new consult    HISTORICAL  CHIEF COMPLAINT:  Chief Complaint  Patient presents with  . Paresthesia, memory changes    rm 7, New Pt  MMSE 30 "involuntary body movements, left side numbness, leg cramps"    HISTORY OF PRESENT ILLNESS:   72 year old female here for evaluation of involuntary movements, numbness, cramps, memory loss.  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  Following these treatments patient did develop some numbness and cramps on the left side, ipsilateral to her treatment.  Patient has had some numbness and tingling in her hands and arms since 2002, but seem to have worsened in 2019.  She was previously diagnosed with "pinched nerves in the elbows" many years ago.  She was evaluated in 2015 for similar symptoms, had EMG nerve conduction study which was normal.  Patient also having intermittent muscle cramps and jerks, especially when she is sleeping at night.  Sometimes these will wake her up from sleep.  Patient also having short-term memory loss and confusion spells.  Patient also having significant anxiety and depression symptoms.    REVIEW OF SYSTEMS: Full 14 system review of systems performed and negative with exception of: Fatigue swelling in legs memory loss headache numbness weakness difficulty swallowing restless legs too much sleep racing thoughts joint pain runny nose cramps aching muscles.  ALLERGIES: Allergies  Allergen Reactions  . Aspirin Swelling  . Compazine [Prochlorperazine Edisylate]     Slurred speech   . Pseudophed-Chlophedianol-Gg     Side effects  . Seroquel [Quetiapine Fumarate] Swelling  . Latex Rash  . Tape Rash    adhesive    HOME MEDICATIONS: Outpatient Medications Prior to Visit  Medication Sig Dispense  Refill  . Albuterol Sulfate (PROAIR RESPICLICK) 778 (90 Base) MCG/ACT AEPB Inhale 2 puffs every 6 (six) hours as needed into the lungs. 1 each 0  . b complex vitamins tablet Bid po OTC    . calcium carbonate (CALCIUM 600) 600 MG TABS tablet Take 600 mg by mouth daily with breakfast.    . cetirizine (ZYRTEC) 10 MG tablet Take 10 mg by mouth daily as needed for allergies.    Marland Kitchen FLUoxetine (PROZAC) 20 MG capsule Take 20 mg by mouth.    . fluticasone (FLONASE) 50 MCG/ACT nasal spray Place 2 sprays into both nostrils daily.    Marland Kitchen GLUCOSA-CHONDR-NA CHONDR-MSM PO Take by mouth daily. W/ vit d    . levothyroxine (SYNTHROID, LEVOTHROID) 75 MCG tablet Take 1 tablet (75 mcg total) by mouth daily before breakfast. 30 tablet 6  . Omega-3 Fatty Acids (FISH OIL) 1000 MG CAPS Take by mouth daily.    Marland Kitchen omeprazole (PRILOSEC) 20 MG capsule Take 20 mg by mouth every other day.     Vladimir Faster Glycol-Propyl Glycol (SYSTANE OP) Apply to eye as needed.    . Red Yeast Rice 600 MG TABS Take by mouth 2 (two) times daily.    . Tiotropium Bromide-Olodaterol (STIOLTO RESPIMAT) 2.5-2.5 MCG/ACT AERS Inhale 2 puffs into the lungs daily. 3 Inhaler 4  . TURMERIC PO Take by mouth.    . valACYclovir (VALTREX) 1000 MG tablet Take 1,000 mg by mouth 2 (two) times daily.    . Zoledronic Acid (RECLAST IV) Inject into the vein. 1 time per year    . buPROPion (WELLBUTRIN XL) 150  MG 24 hr tablet TAKE 1 TABLET BY MOUTH EVERY DAY IN THE MORNING  1  . Calcium-Magnesium-Vitamin D (CALCIUM 1200+D3 PO) Take by mouth.    . Cholecalciferol (HM VITAMIN D3) 4000 units CAPS Take 1 capsule by mouth daily.    . ranitidine (ZANTAC) 150 MG tablet TAKE 1 TABLET EVERY OTHER DAY (Patient not taking: Reported on 06/24/2018) 45 tablet 0  . varenicline (CHANTIX CONTINUING MONTH PAK) 1 MG tablet Take 1 tablet (1 mg total) by mouth 2 (two) times daily. (Patient not taking: Reported on 08/19/2018) 60 tablet 5   No facility-administered medications prior to visit.      PAST MEDICAL HISTORY: Past Medical History:  Diagnosis Date  . Arthritis   . Bipolar 1 disorder ([place])   . Cancer of tongue ([place]) 09/19/2014   HPV infection, and ex smoker. She quit in 2013.   Marland Kitchen GERD (gastroesophageal reflux disease)   . Glaucoma   . Hiatal hernia   . History of HPV infection   . Hx of chronic sinusitis   . 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 Sciatica   . Sensory disturbance 09/19/2014  . Sensory hearing loss, bilateral   . Sjogren's syndrome ([place]) 09-02-2007  . Squamous cell cancer of tongue ([place]) 2014   chemo in [place], PET 07-2013  . Thyroid nodule   . Tongue cancer ([place]) 02/02/2013   HPV/ radiation/chemo    PAST SURGICAL HISTORY: Past Surgical History:  Procedure Laterality Date  . ABDOMINAL HYSTERECTOMY    . APPENDECTOMY  1959  . BREAST BIOPSY     three times, twice on Left, once on Right, diagnosis as calcifications 1994-1999  . CATARACT EXTRACTION Bilateral 05-29-10, 06-14-10  . CHOLECYSTECTOMY    . KNEE SURGERY  08-15-11   arthoscopy  . tongue cancer    . TONSILECTOMY, ADENOIDECTOMY, BILATERAL MYRINGOTOMY AND TUBES  1955  . TUBAL LIGATION  1981  . turbinate surgery     Dr. Nicole Cella in [place].    FAMILY HISTORY: Family History  Problem Relation Age of Onset  . COPD Father   . Thyroid cancer Father 11  . Pancreatic cancer Mother 5       no EtOH, not a smoker  . Ovarian cancer Sister 54       ovarian cancer survivor; BRIP1+  . Pancreatic cancer Maternal Grandmother   . Heart attack Maternal Grandfather 77  . Breast cancer Paternal Grandmother 96  . COPD Paternal Grandfather   . Emphysema Paternal Grandfather   . Prostate cancer Maternal Uncle   . Prostate cancer Maternal Uncle   . Breast cancer Cousin        maternal 1st cousin dx. early 27s; negative for familial BRIP1 mutation  . Asthma Unknown     SOCIAL HISTORY: Social History   Socioeconomic History  . Marital status: Single    Spouse name: Not on file  . Number of  children: 2  . Years of education: 33  . Highest education level: Not on file  Occupational History  . Occupation: retired    Comment: criminal justice  Social Needs  . Financial resource strain: Not on file  . Food insecurity:    Worry: Not on file    Inability: Not on file  . Transportation needs:    Medical: Not on file    Non-medical: Not on file  Tobacco Use  . Smoking status: Former Smoker    Packs/day: 1.00    Years: 50.00    Pack years: 50.00  Types: Cigarettes    Last attempt to quit: 07/01/2018    Years since quitting: 0.1  . Smokeless tobacco: Never Used  . Tobacco comment: quit 07/01/18  Substance and Sexual Activity  . Alcohol use: Yes    Comment: craft beer 2 times week  . Drug use: No  . Sexual activity: Not on file  Lifestyle  . Physical activity:    Days per week: Not on file    Minutes per session: Not on file  . Stress: Not on file  Relationships  . Social connections:    Talks on phone: Not on file    Gets together: Not on file    Attends religious service: Not on file    Active member of club or organization: Not on file    Attends meetings of clubs or organizations: Not on file    Relationship status: Not on file  . Intimate partner violence:    Fear of current or ex partner: Not on file    Emotionally abused: Not on file    Physically abused: Not on file    Forced sexual activity: Not on file  Other Topics Concern  . Not on file  Social History Narrative   Caffeine 2 cups daily, Single, 2 kids, retired.     PHYSICAL EXAM  GENERAL EXAM/CONSTITUTIONAL: Vitals:  Vitals:   08/19/18 0846  BP: 115/62  Pulse: 65  Weight: 198 lb 12.8 oz (90.2 kg)  Height: 5' 8.5" (1.74 m)     Body mass index is 29.79 kg/m. Wt Readings from Last 3 Encounters:  08/19/18 198 lb 12.8 oz (90.2 kg)  06/24/18 192 lb 9.6 oz (87.4 kg)  01/15/18 201 lb 8 oz (91.4 kg)     Patient is in no distress; well developed, nourished and groomed; neck is  supple  CARDIOVASCULAR:  Examination of carotid arteries is normal; no carotid bruits  Regular rate and rhythm, no murmurs  Examination of peripheral vascular system by observation and palpation is normal  EYES:  Ophthalmoscopic exam of optic discs and posterior segments is normal; no papilledema or hemorrhages  Visual Acuity Screening   Right eye Left eye Both eyes  Without correction: 20/30 20/30   With correction:        MUSCULOSKELETAL:  Gait, strength, tone, movements noted in Neurologic exam below  NEUROLOGIC: MENTAL STATUS:  MMSE - Mini Mental State Exam 08/19/2018  Orientation to time 5  Orientation to Place 5  Registration 3  Attention/ Calculation 5  Recall 3  Language- name 2 objects 2  Language- repeat 1  Language- follow 3 step command 3  Language- read & follow direction 1  Write a sentence 1  Copy design 1  Total score 30    awake, alert, oriented to person, place and time  recent and remote memory intact  normal attention and concentration  language fluent, comprehension intact, naming intact  fund of knowledge appropriate  CRANIAL NERVE:   2nd - no papilledema on fundoscopic exam  2nd, 3rd, 4th, 6th - pupils equal and reactive to light, visual fields full to confrontation, extraocular muscles intact, no nystagmus  5th - facial sensation symmetric  7th - facial strength symmetric  8th - hearing intact  9th - palate elevates symmetrically, uvula midline  11th - shoulder shrug symmetric  12th - tongue protrusion midline  MOTOR:   normal bulk and tone, full strength in the BUE, BLE  SENSORY:   normal and symmetric to light touch, temperature, vibration  COORDINATION:   finger-nose-finger, fine finger movements normal  REFLEXES:   deep tendon reflexes present and symmetric  GAIT/STATION:   narrow based gait; able to walk tandem     DIAGNOSTIC DATA (LABS, IMAGING, TESTING) - I reviewed patient records, labs,  notes, testing and imaging myself where available.  No results found for: WBC, HGB, HCT, MCV, PLT No results found for: NA, K, CL, CO2, GLUCOSE, BUN, CREATININE, CALCIUM, PROT, ALBUMIN, AST, ALT, ALKPHOS, BILITOT, GFRNONAA, GFRAA No results found for: CHOL, HDL, LDLCALC, LDLDIRECT, TRIG, CHOLHDL No results found for: HGBA1C No results found for: VITAMINB12 Lab Results  Component Value Date   TSH 5.204 (H) 09/17/2016    07/13/14 MRI neck [I reviewed images myself and agree with interpretation. -VRP]  1. Postsurgical changes of the left tongue base without evidence for residual or recurrent tumor. 2. No significant cervical adenopathy. 3. 12 mm right thyroid nodule. This nodule enhances an was noted on a previous PET report. Recommend further evaluation with thyroid ultrasound. If patient is clinically hyperthyroid, consider nuclear medicine thyroid uptake and scan.   10/02/14 EMG/NCS [Dr. Ahern] - This is a normal study. No electrophysiologic evidence for ulnar or median neuropathy, peripheral polyneuropathy, cervical or lumbar radiculopathy or radiation neuropathy. A small fiber neuropathy could evade detection by this study. Clinical correlation recommended.   06/24/18 CXR [I reviewed images myself and agree with interpretation. -VRP]  - Stable chronic lung disease with evidence of Emphysema (ICD10-J43.9). No superimposed acute findings identified.    ASSESSMENT AND PLAN  72 y.o. year old female here with intermittent muscle jerks, cramps, numbness, memory loss issues.  History of squamous cell carcinoma of the tongue in 2014 status post radiation and chemotherapy.  Dx:  1. Muscle cramps   2. Numbness   3. Memory loss   4. Bipolar disorder, in partial remission, most recent episode depressed ([place])   5. Anxiety      PLAN:  NOCTURAL MYOCLONUS (? REM behavior sleep disorder vs PLMS vs heightened anxiety state) - check MRI brain and labs - may consider clonazepam at bedtime, but  would defer to PCP or psychiatry  NUMBNESS / CRAMPS (history of chemotherapy, radiation tx to left neck in 2014; normal EMG/NCS in 2015) - check labs  MEMORY LOSS (due to insomnia, anxiety, bipolar disorder, chronic pain) - treat underlying causes  ANXIETY / BIPOLAR  - re-establish with psychiatry - optimize nutrition and exercise  LEFT EAR FULLNESS - follow up with PCP and ENT   Orders Placed This Encounter  Procedures  . MR BRAIN W WO CONTRAST  . CK  . Aldolase   Return pending test results.    Penni Bombard, MD 96/29/5284, 1:32 AM Certified in Neurology, Neurophysiology and Neuroimaging  [facility] [address], [place], [place] [address] [phone]

## 2018-08-19 NOTE — Patient Instructions (Signed)
  NUMBNESS / CRAMPS (history of chemotherapy, radiation tx to left neck in 2014; normal EMG/NCS in 2015) - check labs  MEMORY LOSS (due to insomnia, anxiety, bipolar disorder, chronic pain) - treat underlying causes  ANXIETY / BIPOLAR  - re-establish with psychiatry - optimize nutrition and exercise  LEFT EAR FULLNESS - follow up with PCP and ENT

## 2018-08-20 ENCOUNTER — Encounter: Payer: Self-pay | Admitting: *Deleted

## 2018-08-20 LAB — CK: Total CK: 62 U/L (ref 24–173)

## 2018-08-20 LAB — ALDOLASE: Aldolase: 3.9 U/L (ref 3.3–10.3)

## 2018-08-27 DIAGNOSIS — H9113 Presbycusis, bilateral: Secondary | ICD-10-CM | POA: Diagnosis not present

## 2018-08-27 DIAGNOSIS — H6982 Other specified disorders of Eustachian tube, left ear: Secondary | ICD-10-CM | POA: Diagnosis not present

## 2018-08-27 DIAGNOSIS — H90A32 Mixed conductive and sensorineural hearing loss, unilateral, left ear with restricted hearing on the contralateral side: Secondary | ICD-10-CM | POA: Diagnosis not present

## 2018-08-27 DIAGNOSIS — H90A31 Mixed conductive and sensorineural hearing loss, unilateral, right ear with restricted hearing on the contralateral side: Secondary | ICD-10-CM | POA: Diagnosis not present

## 2018-09-09 ENCOUNTER — Ambulatory Visit
Admission: RE | Admit: 2018-09-09 | Discharge: 2018-09-09 | Disposition: A | Discharge status: Home / Self Care | Payer: Medicare Other | Source: Ambulatory Visit | Attending: Diagnostic Neuroimaging | Admitting: Diagnostic Neuroimaging

## 2018-09-09 DIAGNOSIS — R200 Anesthesia of skin: Secondary | ICD-10-CM

## 2018-09-09 DIAGNOSIS — R252 Cramp and spasm: Secondary | ICD-10-CM

## 2018-09-09 DIAGNOSIS — R413 Other amnesia: Secondary | ICD-10-CM

## 2018-09-09 DIAGNOSIS — F3175 Bipolar disorder, in partial remission, most recent episode depressed: Secondary | ICD-10-CM

## 2018-09-09 DIAGNOSIS — F419 Anxiety disorder, unspecified: Secondary | ICD-10-CM

## 2018-09-09 MED ORDER — GADOBENATE DIMEGLUMINE 529 MG/ML IV SOLN
19.0000 mL | Freq: Once | INTRAVENOUS | Status: AC | PRN
  Administered 2018-09-09: 19 mL via INTRAVENOUS

## 2018-09-10 DIAGNOSIS — M810 Age-related osteoporosis without current pathological fracture: Secondary | ICD-10-CM | POA: Diagnosis not present

## 2018-09-16 ENCOUNTER — Telehealth: Payer: Self-pay | Admitting: *Deleted

## 2018-09-16 NOTE — Telephone Encounter (Signed)
Spoke with patient and informed her that her MRI brain result is unremarkable.  Advised she continue with Dr Gladstone Lighter plan to follow up with PCP, re-establish with psychiatry. She stated she has an appointment with psychiatry tomorrow.  she verbalized understanding, appreciation of call.

## 2018-09-17 DIAGNOSIS — F332 Major depressive disorder, recurrent severe without psychotic features: Secondary | ICD-10-CM | POA: Diagnosis not present

## 2018-09-17 DIAGNOSIS — F411 Generalized anxiety disorder: Secondary | ICD-10-CM | POA: Diagnosis not present

## 2018-10-12 DIAGNOSIS — F411 Generalized anxiety disorder: Secondary | ICD-10-CM | POA: Diagnosis not present

## 2018-10-12 DIAGNOSIS — Z23 Encounter for immunization: Secondary | ICD-10-CM | POA: Diagnosis not present

## 2018-10-12 DIAGNOSIS — F319 Bipolar disorder, unspecified: Secondary | ICD-10-CM | POA: Diagnosis not present

## 2018-11-03 DIAGNOSIS — F411 Generalized anxiety disorder: Secondary | ICD-10-CM | POA: Diagnosis not present

## 2018-11-03 DIAGNOSIS — F332 Major depressive disorder, recurrent severe without psychotic features: Secondary | ICD-10-CM | POA: Diagnosis not present

## 2018-11-09 DIAGNOSIS — F411 Generalized anxiety disorder: Secondary | ICD-10-CM | POA: Diagnosis not present

## 2018-11-09 DIAGNOSIS — F319 Bipolar disorder, unspecified: Secondary | ICD-10-CM | POA: Diagnosis not present

## 2018-11-24 DIAGNOSIS — M549 Dorsalgia, unspecified: Secondary | ICD-10-CM | POA: Diagnosis not present

## 2018-12-01 DIAGNOSIS — M47817 Spondylosis without myelopathy or radiculopathy, lumbosacral region: Secondary | ICD-10-CM | POA: Diagnosis not present

## 2018-12-01 DIAGNOSIS — B0223 Postherpetic polyneuropathy: Secondary | ICD-10-CM | POA: Diagnosis not present

## 2018-12-01 DIAGNOSIS — M5136 Other intervertebral disc degeneration, lumbar region: Secondary | ICD-10-CM | POA: Diagnosis not present

## 2018-12-01 DIAGNOSIS — M5432 Sciatica, left side: Secondary | ICD-10-CM | POA: Diagnosis not present

## 2018-12-11 DIAGNOSIS — F319 Bipolar disorder, unspecified: Secondary | ICD-10-CM | POA: Diagnosis not present

## 2018-12-11 DIAGNOSIS — F411 Generalized anxiety disorder: Secondary | ICD-10-CM | POA: Diagnosis not present

## 2018-12-14 NOTE — Progress Notes (Signed)
Subjective:     Patient ID: Lori Day, female   DOB: 16-Nov-1945, 73 y.o.   MRN: [id number]  HPI  ~  April 18, 2015:  Initial visit w/ SN>  Her PCP is Animal nutritionist at [facility], [place]...      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       She relates a hx going back to 2009 in [place]- she had a sleep study- ?results, but she was started on CPAP; she subseq lost weight & improved so she stopped the CPAP on her own;  She was later re-tested and was said to have improved but she doesn't know the details;  Then in 2013 she had another abn sleep test & her physician restarted her CPAP (this was before her tongue cancer Dx);  She again stopped the CPAP in 2013 w/ the tongue cancer dx & treatment regimen of XRT & Chemo;  Then in 2015 she had a neuro eval by DrDohmeier (pt c/o pain & numbness in extremities, hx of pinched nerve, hx CTS, MRI neck w/ foraminal narrowing C5-C6) which included a home sleep study reported neg for OSA but she had nocturnal hypoxemia down to 70% sat she says and pt was referred to DrClance...       She saw DrClance 02/2015 & he reviewed her 08/2014 home sleep test- AHI=3.9 events/hr, lowest O2sat=82% (2hrs below 88%); she also has a hx COPD prev followed by a pulmonologist in [place]; still smoking as noted above; hx DOE w/ stairs and inclines or if rushing on level ground; denied much cough or sput; CXR showed COPD, NAD, and PFTs showed  mod airflow obstruction w/ decr DLCO (59%) and a reversible component;  He tried her on Stiolto- 2sp daily which she has taken over the last month- "about the same" she says (but still smoking)...      Current Meds>  Stiolto Respimat- 2 sprays once daily; Albuterol-HFA rescue inhaler prn, Chantix starter pack per her PCP, Zyrtek10/ Flonase Qhs...       EXAM reveals Afeb, VSS, O2sat=95% on RA at rest;  HEENT- denture, no lesions seen, mallampati2;  Chest- decr BS bilat w/o w/r/r;  Heart- RR w/o m/r/g;  Ext- neg w/o c/c/e...   CXR 03/15/15 showed norm heart size, COPD/emphysema, min atx/scarring at bases, NAD...  PFTs 03/16/15 showed FVC=3.26 (89%), FEV1=1.98 (71%), %1sec=61, mid-flows reduced at 43% predicted; after bronchodil FEV1 improved 16%; Lung volumes wnl; DLCO reduced at 59%... This is c/u mod airflow obstruction, emphysema, and a surprising reversible component...  ONO before considering nocturnal oxygen (Medicare doesn't accept oximetry from sleep studies)> pending=> O2sat<88% of 5h of the 7h study. IMP/PLAN>>  Lori Day has mod COPD/emphysema & a small reversible component; she needs to quit all smoking & use the Stiolto regularly, plus her prn rescue inhaler;  She has been found to have nocturnal hypoxemia but no  signif OSA at this time- we will proceed w/ her ONO to see if she needs nocturnal oxygen... ADDENDUM>>  ONO performed 06/15/15 w/ O2sat<88% for almost 5h of the total 7h study (there were 69 events or 9/hr;  She needs O2 at 2L/min Qhs and MUST QUIT ALL SMOKING!  ~  July 04, 2015:  2-40mo ROV w/ SN>  Lori Day reports that she is improved on the [employer] & hasn't needed her rescue inhaler in some time; she was able to quit smoking transiently w/ the help of Chantix but unfortunately she has restarted the smoking- now back to 1ppd; she understands the importance of smoking cessation in her overall health picture & recovery; we wrote for the Chantix maintenance packs...     Mod  COPD/emphysema w/ small reversible component> on Stiolto-2sp per day + AlbutHFA rescue inhaler; states she is improved on the Stiolto & hasn't needed the rescue inhaler recently; rec to continue the same...    Nocturnal hypoxemia> on O2 at 2L/min Qhs; several sleep tests w/o signif OSA...    Current cigarette smoker> she reports that Chantix helped her quit, was using e-cig, then went on a cruise & started cigarettes again- now back to 1ppd and "life is good" she says; we refilled the Chantix Rx for her...    Hx cancer at base of tongue> s/p XRT, chemoRx in [place] (she was HPV pos); rec to try Biotene for dry mouth    Other medical issues>> Hx GERD, Hx DJD, Hx Bipolar, Hx skin cancers... EXAM reveals Afeb, VSS, O2sat=92% on RA at rest;  HEENT- denture, no lesions seen, mallampati2;  Chest- decr BS bilat w/o w/r/r;  Heart- RR w/o m/r/g;  Ext- neg w/o c/c/e...  We reviewed prob list, meds, xrays and labs>> Given the 2016 Flu vaccine today; home O2 supplied by [facility]... IMP/PLAN>>  Lori Day must quit all smoking & stay quit; rec to continue the Stiolto, use Albut rescue inhaler as needed; use the nocturnal O2 at 2L/min Qhs; get on diet & increase exercise program... She will f/u in 16mo, sooner if needed.  ~  January 01, 2016:  76mo ROV & Lori Day is still smoking ~3cig/d AND taking Chantix;  She notes that her breathing is "great" and she continues on Stiolto- 2sp/d and AlbutHFA rescue inhaler but she hasn't needed this in months;  She also uses Home O2 Qhs due to nocturnal hypoxemia on ONO test- she gets this from [place] [facility] but wants to switch to [facility] she says;  She reports that she had Thyroid surg in [place] 08/28/15 (no records avail to review- she says a nonfunctioning benign nodule) and she had a bout of Shingles in her left hip area 10/30/15 treated by her PCP & resolved... She reports that sister Dx w/ ovarian cancer- pt had genetic testing "I don't have any mutant genes" she says (Oncology note from  12/12/15 is reviewed)...  EXAM reveals Afeb, VSS, O2sat=94% on RA at rest;  HEENT- denture, no lesions seen, mallampati2;  Chest- decr BS bilat w/o w/r/r;  Heart- RR w/o m/r/g;  Abd- soft nontender neg;  Ext- neg w/o c/c/e...  IMP/PLAN>>  Pt wants to change her DME to [facility]- ok w/ me;  We will refill her Stiolto;  Most important intervention is to [place]!!!  We plan ROV in 78mo, sooner if needed prn...   ~  July 03, 2016:  PCP= Marda Stalker, [place];  6 month [place] is stable w/ her GOLD Stage2 COPD/ emphysema- back  to smoking 1ppd, on O2 at 2L/min Qhs ("it helps me sleep"), Stiolto- 2sp/d, AlbutHFA rescue prn, and Flonase/ claritin prn;  She did pretty well w/ Chantix & will try this again...  She had colonoscopy several wks ago by DrOutlaw- polyp removed & f/u planned in 5 yrs... We reviewed the following medical problems during today's office visit >>     Mod COPD/emphysema w/ small reversible component> on Stiolto-2sp per day + AlbutHFA rescue inhaler; states she is improved on the Stiolto & hasn't needed the rescue inhaler recently; rec to continue the same...    Nocturnal hypoxemia> on O2 at 2L/min Qhs; several sleep tests in the past w/o signif OSA...    Current cigarette smoker> she reports that Chantix helped her quit, was using e-cig, then went on a cruise & started cigarettes again- now back to 1ppd and "life is good" she says; we refilled the Chantix Rx for her...    Hx cancer at base of tongue> s/p XRT, chemoRx in [place] (she was HPV pos); rec to try Biotene for dry mouth    S/p thyroid surg in [place] 07/2015> no records avail to review...    Other medical issues>> Hx GERD, Hx DJD, Hx Bipolar, Hx skin cancers... EXAM reveals Afeb, VSS, O2sat=95% on RA at rest;  Wt down 17# to 179# today; HEENT- denture, no lesions seen, mallampati2;  Chest- decr BS bilat w/o w/r/r;  Heart- RR w/o m/r/g;  Ext- neg w/o c/c/e...   MRI Neck 04/10/16>  Asymmetric soft tissue at the right  tongue base may reflect post radiation change; no discrete mass, no adenopathy, mils asymmetric submandib glands R>L...  CXR 04/10/16>  Norm heart size, mild hyperinflation, scarring in upper lobes & bases- NAD, no adenopathy, boney demineralization is apparent... IMP/PLAN>>  Lori Day is her own worst enemy- she must quit all smoking 7 stay quit, Chantix refilled, stay on Stiolto & Albut rescue, 2017 Flu shot given today...  ~  December 31, 2016:  29mo ROV & Lori Day continues to get her general medical care thru [facility], [place]);  She has COPD/emphysema on Stiolto, continued smoking, nocturnal hypoxemia, hx cancer at base of tongue... She denies CP, palpit, change in SOB/DOE, and denies cough/ sput/ hemoptysis, etc; we reviewed the following medical problems during today's office visit >>     Mod COPD/emphysema w/ small reversible component> on Stiolto-2sp per day + AlbutHFA rescue inhaler; states she is improved on the Stiolto & hasn't needed the rescue inhaler recently; rec to continue same; DrSquires has discussed low-dose screening CT program w/ her...    Nocturnal hypoxemia> on O2 at 2L/min Qhs; several sleep tests in the past w/o signif OSA...    Current cigarette smoker> prev back to 1ppd stating "life is good"; we refilled the Chantix Rx for her & she has decr to 3-4 cig/d she says & unable to quit due to stress...    Hx cancer at base of tongue> Dx 2014, s/p XRT (DrSquires follows her locally), chemoRx in [place] (she was HPV pos); rec to try Biotene for dry mouth; she sees DrRosen for ENT locally...    S/p thyroid surg in [place] 07/2015> no records avail to review; she is on Synthroid68mcg/d-- last TSH in Epic was 08/2016=5.20...    Other medical issues>> Hx GERD (on Prilosec), Hx DJD (on Glucosamine), Hx Bipolar (on Prozac), Hx skin cancers, Hearing loss... EXAM reveals Afeb, VSS, O2sat=97% on RA at rest;  Wt back up 13# to 191# today;  HEENT- denture, no lesions  seen, mallampati2;  Chest- decr BS bilat w/o w/r/r;  Heart- RR w/o m/r/g;  Ext- neg w/o c/c/e...  IMP/PLAN>>  Lori Day is stable w/ her COPD, hypoxemia- unfortunately continue smoking & we discussed this again, Chantix helped in ast 7 asked to restart; Stiolto reflled & we plan continue 73mo rov...  ~  June 26, 2017:  37mo ROV & Lori Day reports that she quit smoking 02/25/17 & has been off the Chantix for the last 2wks;  Her breathing is improved as a result & she feels that she can tell a difference having quit smoking!  She notes mild intermit cough, small amt thick beige sput, no hemoptysis; SOB/DOE is about the same eg-walking up hill & she is NOT exercising (next step=> refer to pulm rehab)... [place] Flu vaccine today... We reviewed the following interval Epic records>      She saw ENT-DrRosen 02/27/17 & following>  83yrs s/p chemo/XRT for oropharyngeal Ca in [place]; she tried to stop Omep for her LPR; exam & laryngoscopy were wnl; he ordered Ba swallow (done 05/02/17):  IMPRESSION: 1) Normal oral and pharyngeal phases of swallowing, with no laryngeal penetration or tracheobronchial aspiration. 2) Mild cricopharyngeus muscle dysfunction, characteristic of chronic gastroesophageal reflux disease. 3) Mild esophageal dysmotility, with a pattern characteristic of reflux related dysmotility. 4) Tiny sliding hiatal hernia.  Mild gastroesophageal reflux. 5) No evidence of reflux esophagitis. No evidence of esophageal mass, stricture or ulcer.    She went to the [facility]) on 06/10/17>  Hx osteoporosis (Tscore-3.5 in [place] & -2.5 in [place]) & compression fx of T12; they rec lowest PPI dose needed & poss change to H2Blocker if able; they did labs- SPE was wnl, Chems- wnl (Ca=9.6), PTH- wnl, VitD=22.7 (30-100);  Pt rec to take calcium, glucosamine, consider Reclast vs Prolia...  We reviewed the following medical problems during today's office visit>      Mod COPD/emphysema w/ small reversible  component> on Stiolto-2sp per day + Proair-HFA rescue inhaler; states she is improved on the Stiolto & hasn't needed the rescue inhaler recently; rec to continue same; DrSquires has discussed low-dose screening CT program w/ pt in the past...    Nocturnal hypoxemia> on O2 at 2L/min Qhs; several sleep tests in the past w/o signif OSA...    Ex-cigarette smoker> prev 1ppd stating "life is good"; we refilled the Chantix Rx for her & she decr to 3-4 cig/d & was able to [facility] 02/2017...    Hx cancer at base of tongue> Dx 2014, s/p XRT (DrSquires follows her locally), chemoRx in [place] (she was HPV pos); rec to try Biotene for dry mouth; she sees DrRosen for ENT...    S/p thyroid surg in [place] 07/2015> no records avail to review; she is on Synthroid76mcg/d-- last TSH in Epic was 08/2016=5.20  (PCP is Marda Stalker, [place] at [facility])    Other medical issues>> Hx GERD (on Prilosec20Qod & Zantac150Qod), Hx DJD (on Glucosamine), Osteoporosis (seen in their clinic), Hx Bipolar (on Prozac), Hx skin cancers, Hearing loss... EXAM reveals Afeb, VSS, O2sat=95% on RA at rest;  Wt back up 8# to 198# today; HEENT- denture, no lesions seen, mallampati2;  Chest- decr BS bilat w/o w/r/r;  Heart- RR w/o m/r/g;  Ext- neg w/o c/c/e...   CXR 06/26/17 (independently reviewed by me in the PACS system) showed norm heart size, aortic atherosclerosis, coarse interstitial markings & mild basilar atx- NAD, DDD in Tspine... IMP/PLAN>>  Lori Day has quit smoking-  congrats;  We will refer to [facility];  rec to continue same meds;  We will continue Q54mo rov's and sooner if needed prn...  ~  December 25, 2017:  47mo ROV & Lori Day is stable- she has been in [facility] attending on [employer], states that she is doing great!  She loves the Rehab/ PT & feeling much better overall;  She reports that she started smoking again, then QUIT again!  Now using Wellbutrin per her PCP but she is convineced it's the exercise... she notes mild  intermittent cough, small amt clear sput, no hemoptysis;  SOB/DOE is improved w/ the pulm rehab exercises and she denies CP/ palpit/ edema... she is still using the O2 Qhs but doesn't like it, says she sleeps better without the O2 in her nose, it gives her a HA etc;  She requests to recheck another ONO to see if she still needs the oxygen...     She had RECLAST for her osteoporosis from [facility] 08/2017... We reviewed the following medical problems during today's office visit>      Mod COPD/emphysema w/ small reversible component> on Stiolto-2sp per day + Proair-HFA rescue inhaler; states she is improved on the Stiolto & hasn't needed the rescue inhaler recently; rec to continue same; DrSquires has discussed low-dose screening CT program w/ pt in the past; She is enrolled in the [facility] program at [place] doing very well...    Nocturnal hypoxemia> on O2 at 2L/min Qhs; several sleep tests in the past w/o signif OSA but severe desaturations=> she requests repeat now since she feels so much better off cigs and on the exercise program....    Ex-cigarette smoker> prev 1ppd stating "life is good"; we refilled the Chantix Rx for her & she decr to 3-4 cig/d & was able to [place] 02/2017; subseq restarted then quit again w/ Wellburtin rx...    Hx cancer at base of tongue> Dx 2014, s/p XRT (DrSquires follows her locally), chemoRx in [place] (she was HPV pos); rec to try Biotene for dry mouth; she sees DrRosen for ENT...    S/p thyroid surg in [place] 07/2015> no records avail to review; she is on Synthroid94mcg/d-- last TSH in Epic was 08/2016=5.20  (PCP is Marda Stalker, [place] at [facility]) they do labs & we do not have copies of report.    Other medical issues>> Hx GERD (on Prilosec20Qod & Zantac150Qod), Hx DJD (on Glucosamine), Osteoporosis (seen in their clinic), Hx Bipolar (on Prozac), Hx skin cancers, Hearing loss... EXAM reveals Afeb, VSS, O2sat=95% on RA at rest;  Wt stable at 199# today; HEENT- denture,  no lesions seen, mallampati2;  Chest- decr BS bilat w/o w/r/r;  Heart- RR w/o m/r/g;  Ext- neg w/o c/c/e...   ONO done 12/29/17 on RA>  O2sats were <88% for 3.5H of the 4H study-- she definitely still needs the O2 2L/min Qhs!!! IMP/PLAN>>  Loriel has mod COPD/emphysema & stable on the Stiolto, off cigs, on pulm rehab & her nocturnal O2 which she clearly needs to continue... Rec to keep up the good work & recheck in 43mo...   ~  June 24, 2018:  70mo ROV & [place] reports another good interval but unfortunately smoking again "off & on" she says!  She is again requesting an Rx for Chantix & we will provide this for her;  She states that her breathing is ok- min cough, small amt clear sput, no hemoptysis, and no change in her DOE when walking  uphill or on stairs;  She graduated from [employer] last spring & was unable to afford the graduate program; I reminded her that she can perform the SAME exercises on her own either around the neighborhood, at the Y, or at the mall;  She desperately needs to quit all smoking and stay quit!  She will consider the low-dose screening CT program after the 1st of the year...    She had a f/u ENT appt w/ DrRosen 06/09/18>  He too was concerned about her resumed smoking- no evid of recurrence on ENT exam, now 5 yrs out & asked to f/u as needed... We reviewed the following medical problems during today's office visit>      Mod COPD/emphysema w/ small reversible component> on Stiolto-2sp per day + Proair-HFA rescue inhaler; states she is improved on the Stiolto & hasn't needed the rescue inhaler recently; rec to continue same; DrSquires has discussed low-dose screening CT program w/ pt in the past; She participated in the [place] program at [facility] in [address] & did very well, could not afford the maintenance program & reminded to do daily exercise on er own now...    Nocturnal hypoxemia> on O2 at 2L/min Qhs; several sleep tests in the past w/o signif OSA but severe  desaturations=> she requests repeat ONO 12/2017 & results proved continued signif desat on RA at night=> continue the nocturnal O2...    Cigarette smoker> prev 1ppd stating "life is good"; we refilled the Chantix Rx for her & she decr to 3-4 cig/d & was able to [place] 02/2017; subseq restarted then quit again w/ Wellburtin rx, then restarted again & requests more Chantix now...    Hx cancer at base of tongue> Dx 2014, s/p XRT (DrSquires follows her locally), chemoRx in [place] (she was HPV pos); rec to try Biotene for dry mouth; she sees DrRosen for ENT=> last 12/2017 & exam neg...    S/p thyroid surg in [place] 07/2015> no records avail to review; she is on Synthroid70mcg/d-- last TSH in Epic was 08/2016=5.20  (PCP is Marda Stalker, PA at [facility]) they do labs & we do not have copies of report.    Other medical issues>> Hx GERD (on Prilosec20Qod & Zantac150Qod), Hx DJD (on Glucosamine), Osteoporosis (seen in their clinic), Hx Bipolar (on Prozac), Hx skin cancers, Hearing loss... EXAM reveals Afeb, VSS, O2sat=94% on RA at rest;  Wt stable at 193# today; HEENT- denture, no lesions seen, mallampati2;  Chest- decr BS bilat w/o w/r/r;  Heart- RR w/o m/r/g;  Ext- neg w/o c/c/e...   CXR 06/24/18>  Norm heart size, COPD changes w/ coarse interstitial markings but NAD, osteopenia noted and GB clips in RUQ...  IMP/PLAN>>  Unfortunately Lori Day has resumed smoking & I reiterated her substantial risk of lung cancer or recurrent oral ca, she requests refill of Chantix rx- ok;  She will continue the same meds for now & will consider the low dose screening CT program in 2020;  We discussed my up coming retirement 10/2018 & we will refer her to DrIcard in 6mo time...     Past Medical History  Diagnosis Date    COPD/emphsema, cig smoker >>      Hx OSA, nocturnal hypoxemia >>    . 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 Thyroid  nodule >> states she had eval & f/u in [place], Bx=neg   . Hx of chronic sinusitis   . GERD (gastroesophageal reflux disease) >> on Omeprazole20/ Zantac   . Bipolar 1 disorder >> she reports off all meds x PROZAC20   . 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 Hiatal hernia   . History of HPV infection   . Glaucoma   . Sciatica   . Sensory hearing loss, bilateral   . Sjogren's syndrome 09-02-2007  . 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 Skin cancer w/ Moh's surg on right side of nose     Past Surgical History:  Procedure Laterality Date  . ABDOMINAL HYSTERECTOMY    . APPENDECTOMY  1959  . BREAST BIOPSY     three times, twice on Left, once on Right, diagnosis as calcifications 1994-1999  . CATARACT EXTRACTION Bilateral 05-29-10, 06-14-10  . CHOLECYSTECTOMY    . KNEE SURGERY  08-15-11   arthoscopy  . tongue cancer    . TONSILECTOMY, ADENOIDECTOMY, BILATERAL MYRINGOTOMY AND TUBES  1955  . TUBAL LIGATION  1981  . turbinate surgery     Dr. Nicole Cella in [place].    Outpatient Encounter Medications as of 06/24/2018  Medication Sig  . Albuterol Sulfate (PROAIR RESPICLICK) 166 (90 Base) MCG/ACT AEPB Inhale 2 puffs every 6 (six) hours as needed into the lungs.  Marland Kitchen b complex vitamins tablet Bid po OTC  . buPROPion (WELLBUTRIN XL) 150 MG 24 hr tablet TAKE 1 TABLET BY MOUTH EVERY DAY IN THE MORNING  . calcium carbonate (CALCIUM 600) 600 MG TABS tablet Take 600 mg by mouth daily with breakfast.  . Calcium-Magnesium-Vitamin D (CALCIUM 1200+D3 PO) Take by mouth.  . cetirizine (ZYRTEC) 10 MG tablet Take 10 mg by mouth daily as needed for allergies.  Marland Kitchen FLUoxetine (PROZAC) 20 MG capsule Take 20 mg by mouth.  . fluticasone (FLONASE) 50 MCG/ACT nasal spray Place 2 sprays into both nostrils daily.  Marland Kitchen GLUCOSA-CHONDR-NA CHONDR-MSM PO Take by mouth daily. W/ vit d  . levothyroxine (SYNTHROID, LEVOTHROID)  75 MCG tablet Take 1 tablet (75 mcg total) by mouth daily before breakfast.  . Omega-3 Fatty Acids (FISH OIL) 1000 MG CAPS Take by mouth daily.  Marland Kitchen omeprazole (PRILOSEC) 20 MG capsule Take 20 mg by mouth every other day.   Vladimir Faster Glycol-Propyl Glycol (SYSTANE OP) Apply to eye as needed.  . Red Yeast Rice 600 MG TABS Take by mouth 2 (two) times daily.  . Tiotropium Bromide-Olodaterol (STIOLTO RESPIMAT) 2.5-2.5 MCG/ACT AERS Inhale 2 puffs into the lungs daily.  . TURMERIC PO Take by mouth.  . valACYclovir (VALTREX) 1000 MG tablet Take 1,000 mg by mouth 2 (two) times daily.  . Zoledronic Acid (RECLAST IV) Inject into the vein. 1 time per year  . Cholecalciferol (HM VITAMIN D3) 4000 units CAPS Take 1 capsule by mouth daily.  . ranitidine (ZANTAC) 150 MG tablet TAKE 1 TABLET EVERY OTHER DAY (Patient not taking: Reported on 06/24/2018)  . varenicline (CHANTIX CONTINUING MONTH PAK) 1 MG tablet Take 1 tablet (1 mg total) by mouth 2 (two) times daily. (Patient not taking: Reported on 08/19/2018)   No facility-administered encounter medications on file as of 06/24/2018.     Allergies  Allergen Reactions  . Aspirin Swelling  . Compazine [Prochlorperazine Edisylate]     Slurred speech   . Pseudophed-Chlophedianol-Gg     Side effects  . Seroquel [Quetiapine Fumarate] Swelling  . Latex  Rash  . Tape Rash    adhesive    Immunization History  Administered Date(s) Administered  . Influenza,inj,Quad PF,6+ Mos 07/04/2015, 07/03/2016, 06/26/2017  . Pneumococcal Conjugate-13 06/29/2011  . Pneumococcal Polysaccharide-23 10/28/2013    Current Medications, Allergies, Past Medical History, Past Surgical History, Family History, and Social History were reviewed in [employer] record.   Review of Systems            All symptoms NEG except where BOLDED >>  Constitutional:  F/C/S, fatigue, anorexia, unexpected weight change. HEENT:  HA, visual changes, hearing loss, earache,  nasal symptoms, sore throat, mouth sores, hoarseness. Resp:  cough, sputum, hemoptysis; SOB, tightness, wheezing. Cardio:  CP, palpit, DOE, orthopnea, edema. GI:  N/V/D/C, blood in stool; reflux, abd pain, distention, gas. GU:  dysuria, freq, urgency, hematuria, flank pain, voiding difficulty. MS:  joint pain, swelling, tenderness, decr ROM; neck pain, back pain, etc. Neuro:  HA, tremors, seizures, dizziness, syncope, weakness, numbness, gait abn. Skin:  suspicious lesions or skin rash. Heme:  adenopathy, bruising, bleeding. Psyche:  confusion, agitation, sleep disturbance, hallucinations, anxiety, depression suicidal.   Objective:   Physical Exam      Vital Signs:  Reviewed...   General:  WD, WN, 73 y/o WF in NAD; alert & oriented; pleasant & cooperative... HEENT:  [place]/AT; Conjunctiva- pink, Sclera- nonicteric, EOM-wnl, PERRLA, EACs-clear, TMs-wnl; NOSE-clear; THROAT- dentures, neg w/o lesions seen.  Neck:  Supple w/ fair ROM; no JVD; normal carotid impulses w/o bruits; no thyromegaly or nodules palpated; no lymphadenopathy.  Chest:  decr BS bilat without wheezes, rales, or rhonchi heard. Heart:  Regular Rhythm; norm S1 & S2 without murmurs, rubs, or gallops detected. Abdomen:  Soft & nontender- no guarding or rebound; normal bowel sounds; no organomegaly or masses palpated. Ext:  Normal ROM; without deformities or arthritic changes; no varicose veins, venous insuffic, or edema;  Pulses intact w/o bruits. Neuro:  No focal neuro deficit; gait normal & balance OK. Derm:  No lesions noted; no rash etc. Lymph:  No cervical, supraclavicular, axillary, or inguinal adenopathy palpated.   Assessment:      IMP >>  1)  Mod COPD/emphysema w/ small reversible component-- on Stiolto-2sp per day + AlbutHFA rescue inhaler... 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 Other  medical issues>>  Hx GERD, Hx DJD, Hx Bipolar, Hx skin cancers   PLAN >>  04/18/15>  Lori Day has mod COPD/emphysema & a small reversible component; she needs to quit all smoking & use the Stiolto regularly, plus her prn rescue inhaler;  She has been found to have nocturnal hypoxemia but no signif OSA at this time- we will proceed w/ her ONO to see if she needs nocturnal oxygen... 07/04/15>  Lori Day must quit all smoking & stay quit; rec to continue the Stiolto, use Albut rescue inhaler as needed; use the nocturnal O2 at 2L/min Qhs; get on diet & increase exercise program... She will f/u in 18mo, sooner if needed. 01/01/16>  Pt wants to change her DME to [facility]- ok w/ me;  We will refill her Stiolto;  Most important intervention is to [place]!!!  We plan ROV in 57mo, sooner if needed prn 07/03/16>  Lori Day is her own worst enemy- she must quit all smoking 7 stay quit, Chantix refilled, stay on Stiolto & Albut rescue, 2017 Flu shot given today... 12/31/16>  Lori Day is stable w/ her COPD, hypoxemia- unfortunately continue smoking & we discussed this again, Chantix helped in ast 7 asked to restart; Stiolto reflled & we plan continue 74mo rov 06/26/17>  Lori Day has quit smoking- congrats;  We will refer to [facility];  rec to continue same meds;  We will continue Q56mo rov's and sooner if needed prn. 12/25/17>   Lori Day has mod COPD/emphysema & stable on the Stiolto, off cigs, on pulm rehab & her nocturnal O2 which she clearly needs to continue... Rec to keep up the good work & recheck in 1mo 06/24/18>   Unfortunately Lori Day has resumed smoking & I reiterated her substantial risk of lung cancer or recurrent oral ca, she requests refill of Chantix rx- ok;  She will continue the same meds for now & will consider the low dose screening CT program in 2020;  We discussed my up coming retirement 10/2018 & we will refer her to DrIcard in 47mo time...    Plan:     Patient's Medications  New Prescriptions    VARENICLINE (CHANTIX CONTINUING MONTH PAK) 1 MG TABLET    Take 1 tablet (1 mg total) by mouth 2 (two) times daily.  Previous Medications   ALBUTEROL SULFATE (PROAIR RESPICLICK) 458 (90 BASE) MCG/ACT AEPB    Inhale 2 puffs every 6 (six) hours as needed into the lungs.   B COMPLEX VITAMINS TABLET    Bid po OTC   BUPROPION (WELLBUTRIN XL) 150 MG 24 HR TABLET    TAKE 1 TABLET BY MOUTH EVERY DAY IN THE MORNING   CALCIUM CARBONATE (CALCIUM 600) 600 MG TABS TABLET    Take 600 mg by mouth daily with breakfast.   CALCIUM-MAGNESIUM-VITAMIN D (CALCIUM 1200+D3 PO)    Take by mouth.   CETIRIZINE (ZYRTEC) 10 MG TABLET    Take 10 mg by mouth daily as needed for allergies.   CHOLECALCIFEROL (HM VITAMIN D3) 4000 UNITS CAPS    Take 1 capsule by mouth daily.   FLUOXETINE (PROZAC) 20 MG CAPSULE    Take 20 mg by mouth.   FLUTICASONE (FLONASE) 50 MCG/ACT NASAL SPRAY    Place 2 sprays into both nostrils daily.   GLUCOSA-CHONDR-NA CHONDR-MSM PO    Take by mouth daily. W/ vit d   LEVOTHYROXINE (SYNTHROID, LEVOTHROID) 75 MCG TABLET    Take 1 tablet (75 mcg total) by mouth daily before breakfast.   OMEGA-3 FATTY ACIDS (FISH OIL) 1000 MG CAPS    Take by mouth daily.   OMEPRAZOLE (PRILOSEC) 20 MG CAPSULE    Take 20 mg by mouth every other day.    POLYETHYL GLYCOL-PROPYL GLYCOL (SYSTANE OP)    Apply to eye as needed.   RANITIDINE (ZANTAC) 150 MG TABLET    TAKE 1 TABLET EVERY OTHER DAY   RED YEAST RICE 600 MG TABS    Take by mouth 2 (two) times daily.   TIOTROPIUM BROMIDE-OLODATEROL (STIOLTO RESPIMAT) 2.5-2.5 MCG/ACT AERS    Inhale 2 puffs into the lungs daily.   TURMERIC PO    Take by mouth.   VALACYCLOVIR (VALTREX) 1000 MG TABLET    Take 1,000 mg by mouth 2 (two) times daily.   ZOLEDRONIC ACID (RECLAST IV)    Inject into the vein. 1 time per year  Modified Medications   No medications on file  Discontinued Medications   No medications on file

## 2018-12-15 ENCOUNTER — Ambulatory Visit (INDEPENDENT_AMBULATORY_CARE_PROVIDER_SITE_OTHER): Payer: Medicare Other | Admitting: Pulmonary Disease

## 2018-12-15 ENCOUNTER — Encounter: Payer: Self-pay | Admitting: Pulmonary Disease

## 2018-12-15 VITALS — BP 110/70 | HR 85 | Ht 68.5 in | Wt 208.0 lb

## 2018-12-15 DIAGNOSIS — G4734 Idiopathic sleep related nonobstructive alveolar hypoventilation: Secondary | ICD-10-CM | POA: Diagnosis not present

## 2018-12-15 DIAGNOSIS — J449 Chronic obstructive pulmonary disease, unspecified: Secondary | ICD-10-CM | POA: Diagnosis not present

## 2018-12-15 DIAGNOSIS — J432 Centrilobular emphysema: Secondary | ICD-10-CM

## 2018-12-15 DIAGNOSIS — F17210 Nicotine dependence, cigarettes, uncomplicated: Secondary | ICD-10-CM

## 2018-12-15 DIAGNOSIS — Z720 Tobacco use: Secondary | ICD-10-CM | POA: Diagnosis not present

## 2018-12-15 DIAGNOSIS — J439 Emphysema, unspecified: Secondary | ICD-10-CM

## 2018-12-15 MED ORDER — TIOTROPIUM BROMIDE-OLODATEROL 2.5-2.5 MCG/ACT IN AERS: 2.0000 | INHALATION_SPRAY | Freq: Every day | RESPIRATORY_TRACT | 0 refills | Status: DC

## 2018-12-15 NOTE — Patient Instructions (Signed)
Thank you for visiting Dr. Valeta Harms at [facility]. Today we recommend the following: Orders Placed This Encounter  Procedures  . Ambulatory Referral for Lung Cancer Scre  . Pulmonary function test    Return in about 1 year (around 12/16/2019).

## 2018-12-15 NOTE — Progress Notes (Signed)
Synopsis: Referred in Feb 2020 for follow, patient of Dr. Lenna Gilford. PCP: Marda Stalker, PA-C  Subjective:   PATIENT ID: Lori Day GENDER: female DOB: 10/23/1946, MRN: [id number]  Chief Complaint  Patient presents with  . Consult    Former [facility] patient. She states her SOB has gotten worse since she has gained weight due to her bipolar medication.     PMH arthritis, bipolar disease, GERD, tongue cancer, currently on chantix, trying to quit smoking. She has been on chantix in the past off and on. Tongue cancer survivor 5 years. She was treated with prednisone for back pains. Concerned today about weight gain. She has follow up with psych as well as counselor in the coming weeks.  When asked the patient about her psychiatric history and her current ongoing symptoms.  She does admits to occasional hallucinations and seeing shadows within the room of what she thinks is people on occasion.  She also hears voices sometimes.  She says that she is used to the voices and none of the voices tell her to harm anyone or anything.  She said sometimes it sounds like it is static within her brain.  I encouraged her to follow-up with her psychiatrist regarding these ongoing symptoms.  Overall she had pulmonary function test completed in the past which were demonstrated mild COPD.  And she is well controlled on her current Stiolto regimen.  Otherwise her respiratory complaints are at baseline and she has had no additional symptoms.  Has never been enrolled in lung cancer screening program.  And she has been seen in pulmonary rehab in the past which she really enjoyed.  She does understand that she would like to get out this spring time and exercise more.  She spends most of her time sitting at home on the couch watching television or researching genealogy.   Past Medical History:  Diagnosis Date  . Arthritis   . Bipolar 1 disorder ([place])   . Cancer of tongue ([place]) 09/19/2014   HPV infection, and ex  smoker. She quit in 2013.   Marland Kitchen GERD (gastroesophageal reflux disease)   . Glaucoma   . Hiatal hernia   . History of HPV infection   . Hx of chronic sinusitis   . 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 Sciatica   . Sensory disturbance 09/19/2014  . Sensory hearing loss, bilateral   . Sjogren's syndrome ([place]) 09-02-2007  . 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 Thyroid nodule   . Tongue cancer ([place]) 02/02/2013   HPV/ radiation/chemo     Family History  Problem Relation Age of Onset  . COPD Father   . Thyroid cancer Father 14  . Pancreatic cancer Mother 99       no EtOH, not a smoker  . Ovarian cancer Sister 33       ovarian cancer survivor; BRIP1+  . Pancreatic cancer Maternal Grandmother   . Heart attack Maternal Grandfather 77  . Breast cancer Paternal Grandmother 35  . COPD Paternal Grandfather   . Emphysema Paternal Grandfather   . Prostate cancer Maternal Uncle   . Prostate cancer Maternal Uncle   . Breast cancer Cousin        maternal 1st cousin dx. early 82s; negative for familial BRIP1 mutation  . Asthma Other      Past Surgical History:  Procedure Laterality Date  . ABDOMINAL HYSTERECTOMY    . APPENDECTOMY  1959  . BREAST  BIOPSY     three times, twice on Left, once on Right, diagnosis as calcifications 1994-1999  . CATARACT EXTRACTION Bilateral 05-29-10, 06-14-10  . CHOLECYSTECTOMY    . KNEE SURGERY  08-15-11   arthoscopy  . tongue cancer    . TONSILECTOMY, ADENOIDECTOMY, BILATERAL MYRINGOTOMY AND TUBES  1955  . TUBAL LIGATION  1981  . turbinate surgery     Dr. Nicole Cella in [place].    Social History   Socioeconomic History  . Marital status: Single    Spouse name: Not on file  . Number of children: 2  . Years of education: 29  . Highest education level: Not on file  Occupational History  . Occupation: retired    Comment: criminal justice  Social Needs  . Financial resource strain: Not on file  . Food insecurity:     Worry: Not on file    Inability: Not on file  . Transportation needs:    Medical: Not on file    Non-medical: Not on file  Tobacco Use  . Smoking status: Former Smoker    Packs/day: 1.00    Years: 50.00    Pack years: 50.00    Types: Cigarettes    Last attempt to quit: 07/01/2018    Years since quitting: 0.4  . Smokeless tobacco: Never Used  . Tobacco comment: quit 07/01/18  Substance and Sexual Activity  . Alcohol use: Yes    Comment: craft beer 2 times week  . Drug use: No  . Sexual activity: Not on file  Lifestyle  . Physical activity:    Days per week: Not on file    Minutes per session: Not on file  . Stress: Not on file  Relationships  . Social connections:    Talks on phone: Not on file    Gets together: Not on file    Attends religious service: Not on file    Active member of club or organization: Not on file    Attends meetings of clubs or organizations: Not on file    Relationship status: Not on file  . Intimate partner violence:    Fear of current or ex partner: Not on file    Emotionally abused: Not on file    Physically abused: Not on file    Forced sexual activity: Not on file  Other Topics Concern  . Not on file  Social History Narrative   Caffeine 2 cups daily, Single, 2 kids, retired.     Allergies  Allergen Reactions  . Aspirin Swelling  . Compazine [Prochlorperazine Edisylate]     Slurred speech   . Pseudophed-Chlophedianol-Gg     Side effects  . Seroquel [Quetiapine Fumarate] Swelling  . Latex Rash  . Tape Rash    adhesive     Outpatient Medications Prior to Visit  Medication Sig Dispense Refill  . Albuterol Sulfate (PROAIR RESPICLICK) 709 (90 Base) MCG/ACT AEPB Inhale 2 puffs every 6 (six) hours as needed into the lungs. 1 each 0  . b complex vitamins tablet Bid po OTC    . calcium carbonate (CALCIUM 600) 600 MG TABS tablet Take 600 mg by mouth daily with breakfast.    . Calcium-Magnesium-Vitamin D (CALCIUM 1200+D3 PO) Take by mouth.      . cetirizine (ZYRTEC) 10 MG tablet Take 10 mg by mouth daily as needed for allergies.    . Cholecalciferol (HM VITAMIN D3) 4000 units CAPS Take 1 capsule by mouth daily.    Marland Kitchen FLUoxetine (PROZAC) 20 MG  capsule Take 20 mg by mouth.    . fluticasone (FLONASE) 50 MCG/ACT nasal spray Place 2 sprays into both nostrils daily.    Marland Kitchen GLUCOSA-CHONDR-NA CHONDR-MSM PO Take by mouth daily. W/ vit d    . levothyroxine (SYNTHROID, LEVOTHROID) 75 MCG tablet Take 1 tablet (75 mcg total) by mouth daily before breakfast. 30 tablet 6  . Omega-3 Fatty Acids (FISH OIL) 1000 MG CAPS Take by mouth daily.    Marland Kitchen omeprazole (PRILOSEC) 20 MG capsule Take 20 mg by mouth every other day.     Vladimir Faster Glycol-Propyl Glycol (SYSTANE OP) Apply to eye as needed.    . Red Yeast Rice 600 MG TABS Take by mouth 2 (two) times daily.    . Tiotropium Bromide-Olodaterol (STIOLTO RESPIMAT) 2.5-2.5 MCG/ACT AERS Inhale 2 puffs into the lungs daily. 3 Inhaler 4  . TURMERIC PO Take by mouth.    . valACYclovir (VALTREX) 1000 MG tablet Take 1,000 mg by mouth 2 (two) times daily.    . varenicline (CHANTIX CONTINUING MONTH PAK) 1 MG tablet Take 1 tablet (1 mg total) by mouth 2 (two) times daily. 60 tablet 5  . Zoledronic Acid (RECLAST IV) Inject into the vein. 1 time per year    . buPROPion (WELLBUTRIN XL) 150 MG 24 hr tablet TAKE 1 TABLET BY MOUTH EVERY DAY IN THE MORNING  1  . ranitidine (ZANTAC) 150 MG tablet TAKE 1 TABLET EVERY OTHER DAY (Patient not taking: Reported on 12/15/2018) 45 tablet 0   No facility-administered medications prior to visit.     Review of Systems  Constitutional: Negative for chills, fever, malaise/fatigue and weight loss.  HENT: Positive for hearing loss. Negative for sore throat and tinnitus.   Eyes: Negative for blurred vision and double vision.  Respiratory: Positive for cough and shortness of breath. Negative for hemoptysis, sputum production, wheezing and stridor.   Cardiovascular: Negative for chest pain,  palpitations, orthopnea, leg swelling and PND.  Gastrointestinal: Negative for abdominal pain, constipation, diarrhea, heartburn, nausea and vomiting.  Genitourinary: Negative for dysuria, hematuria and urgency.  Musculoskeletal: Negative for joint pain and myalgias.  Skin: Negative for itching and rash.  Neurological: Negative for dizziness, tingling, weakness and headaches.  Endo/Heme/Allergies: Negative for environmental allergies. Does not bruise/bleed easily.  Psychiatric/Behavioral: Positive for hallucinations. Negative for depression. The patient is not nervous/anxious and does not have insomnia.   All other systems reviewed and are negative.    Objective:  Physical Exam Vitals signs reviewed.  Constitutional:      General: She is not in acute distress.    Appearance: She is well-developed.  HENT:     Head: Normocephalic and atraumatic.     Mouth/Throat:     Pharynx: No oropharyngeal exudate.  Eyes:     Conjunctiva/sclera: Conjunctivae normal.     Pupils: Pupils are equal, round, and reactive to light.  Neck:     Vascular: No JVD.     Trachea: No tracheal deviation.  Cardiovascular:     Rate and Rhythm: Normal rate and regular rhythm.     Heart sounds: S1 normal and S2 normal.     Comments: Distant heart tones Pulmonary:     Effort: No tachypnea or accessory muscle usage.     Breath sounds: No stridor. Decreased breath sounds (throughout all lung fields) present. No wheezing, rhonchi or rales.  Abdominal:     General: Bowel sounds are normal. There is no distension.     Palpations: Abdomen is soft.  Tenderness: There is no abdominal tenderness.  Musculoskeletal:        General: Deformity (muscle wasting ) present.  Skin:    General: Skin is warm and dry.     Capillary Refill: Capillary refill takes less than 2 seconds.     Findings: No rash.  Neurological:     Mental Status: She is alert and oriented to person, place, and time.  Psychiatric:        Behavior:  Behavior normal.      Vitals:   12/15/18 1428  BP: 110/70  Pulse: 85  SpO2: 93%  Weight: 208 lb (94.3 kg)  Height: 5' 8.5" (1.74 m)   93% on RA BMI Readings from Last 3 Encounters:  12/15/18 31.17 kg/m  08/19/18 29.79 kg/m  06/24/18 28.86 kg/m   Wt Readings from Last 3 Encounters:  12/15/18 208 lb (94.3 kg)  08/19/18 198 lb 12.8 oz (90.2 kg)  06/24/18 192 lb 9.6 oz (87.4 kg)     CBC No results found for: WBC, RBC, HGB, HCT, PLT, MCV, MCH, MCHC, RDW, LYMPHSABS, MONOABS, EOSABS, BASOSABS  Chest Imaging: Patient has not had any prior axial CT imaging of the chest  Chest x-ray 06/26/2017: Evidence of COPD.  No infiltrate The patient's images have been independently reviewed by me.    Pulmonary Functions Testing Results: PFT Results Latest Ref Rng & Units 03/16/2015  FVC-Pre L 3.26  FVC-Predicted Pre % 89  FVC-Post L 3.57  FVC-Predicted Post % 98  Pre FEV1/FVC % % 61  Post FEV1/FCV % % 64  FEV1-Pre L 1.98  FEV1-Predicted Pre % 71  FEV1-Post L 2.30  DLCO UNC% % 59  DLCO COR %Predicted % 60  TLC L 5.95  TLC % Predicted % 103  RV % Predicted % 107     FeNO: None   Pathology: None  Echocardiogram: None   Heart Catheterization: None     Assessment & Plan:   Tobacco abuse - Plan: Ambulatory Referral for Lung Cancer Scre  Pulmonary emphysema, unspecified emphysema type ([place]) - Plan: Pulmonary function test  Nocturnal hypoxemia  Cigarette smoker  Centrilobular emphysema (HCC)  Stage 1 mild COPD by GOLD classification ([place])  Discussion:  This is a 73 year old female with a significant tobacco abuse history.  Unfortunately she is still smoking.  She would like to restart her Chantix.  She has been on this several times throughout the year she says but however as been unable to stop and stay stopped.  Recently due to her worsening psychiatric history and uncontrolled symptoms and recently being started on bipolar medications feels like she must continue  smoking.  Greater than 10 minutes of this patient's office visit was spent counseling on smoking cessation and the various methods.  We were able to give her additional samples of her Stiolto.  I think she can continue this as she her symptoms are well controlled.  We will place a referral to our lung cancer screening program for axial imaging of the chest.  I think that she is a good candidate for this with a very significant smoking history.  Patient should continue her vitamin D supplementation  Patient to return to our clinic in 1 year for follow-up of her mild COPD.  Greater than 50% of this patient's 25-minute office visit was spent face-to-face discussing the above recommendations and treatment plan.   Current Outpatient Medications:  .  Albuterol Sulfate (PROAIR RESPICLICK) 829 (90 Base) MCG/ACT AEPB, Inhale 2 puffs every 6 (six)  hours as needed into the lungs., Disp: 1 each, Rfl: 0 .  b complex vitamins tablet, Bid po OTC, Disp: , Rfl:  .  calcium carbonate (CALCIUM 600) 600 MG TABS tablet, Take 600 mg by mouth daily with breakfast., Disp: , Rfl:  .  Calcium-Magnesium-Vitamin D (CALCIUM 1200+D3 PO), Take by mouth., Disp: , Rfl:  .  cetirizine (ZYRTEC) 10 MG tablet, Take 10 mg by mouth daily as needed for allergies., Disp: , Rfl:  .  Cholecalciferol (HM VITAMIN D3) 4000 units CAPS, Take 1 capsule by mouth daily., Disp: , Rfl:  .  FLUoxetine (PROZAC) 20 MG capsule, Take 20 mg by mouth., Disp: , Rfl:  .  fluticasone (FLONASE) 50 MCG/ACT nasal spray, Place 2 sprays into both nostrils daily., Disp: , Rfl:  .  GLUCOSA-CHONDR-NA CHONDR-MSM PO, Take by mouth daily. W/ vit d, Disp: , Rfl:  .  levothyroxine (SYNTHROID, LEVOTHROID) 75 MCG tablet, Take 1 tablet (75 mcg total) by mouth daily before breakfast., Disp: 30 tablet, Rfl: 6 .  Omega-3 Fatty Acids (FISH OIL) 1000 MG CAPS, Take by mouth daily., Disp: , Rfl:  .  omeprazole (PRILOSEC) 20 MG capsule, Take 20 mg by mouth every other day. ,  Disp: , Rfl:  .  Polyethyl Glycol-Propyl Glycol (SYSTANE OP), Apply to eye as needed., Disp: , Rfl:  .  Red Yeast Rice 600 MG TABS, Take by mouth 2 (two) times daily., Disp: , Rfl:  .  Tiotropium Bromide-Olodaterol (STIOLTO RESPIMAT) 2.5-2.5 MCG/ACT AERS, Inhale 2 puffs into the lungs daily., Disp: 3 Inhaler, Rfl: 4 .  TURMERIC PO, Take by mouth., Disp: , Rfl:  .  valACYclovir (VALTREX) 1000 MG tablet, Take 1,000 mg by mouth 2 (two) times daily., Disp: , Rfl:  .  varenicline (CHANTIX CONTINUING MONTH PAK) 1 MG tablet, Take 1 tablet (1 mg total) by mouth 2 (two) times daily., Disp: 60 tablet, Rfl: 5 .  Zoledronic Acid (RECLAST IV), Inject into the vein. 1 time per year, Disp: , Rfl:    Garner Nash, DO [facility] 12/15/2018 2:37 PM

## 2018-12-17 ENCOUNTER — Other Ambulatory Visit: Payer: Self-pay | Admitting: Acute Care

## 2018-12-17 DIAGNOSIS — Z87891 Personal history of nicotine dependence: Secondary | ICD-10-CM

## 2018-12-17 DIAGNOSIS — F17210 Nicotine dependence, cigarettes, uncomplicated: Secondary | ICD-10-CM

## 2018-12-17 DIAGNOSIS — Z122 Encounter for screening for malignant neoplasm of respiratory organs: Secondary | ICD-10-CM

## 2018-12-28 DIAGNOSIS — F332 Major depressive disorder, recurrent severe without psychotic features: Secondary | ICD-10-CM | POA: Diagnosis not present

## 2018-12-28 DIAGNOSIS — F411 Generalized anxiety disorder: Secondary | ICD-10-CM | POA: Diagnosis not present

## 2018-12-31 DIAGNOSIS — B0223 Postherpetic polyneuropathy: Secondary | ICD-10-CM | POA: Diagnosis not present

## 2018-12-31 DIAGNOSIS — M5136 Other intervertebral disc degeneration, lumbar region: Secondary | ICD-10-CM | POA: Diagnosis not present

## 2018-12-31 DIAGNOSIS — M791 Myalgia, unspecified site: Secondary | ICD-10-CM | POA: Diagnosis not present

## 2019-01-05 ENCOUNTER — Other Ambulatory Visit: Payer: Self-pay | Admitting: Orthopaedic Surgery

## 2019-01-05 DIAGNOSIS — M5136 Other intervertebral disc degeneration, lumbar region: Secondary | ICD-10-CM

## 2019-01-08 DIAGNOSIS — F319 Bipolar disorder, unspecified: Secondary | ICD-10-CM | POA: Diagnosis not present

## 2019-01-08 DIAGNOSIS — F411 Generalized anxiety disorder: Secondary | ICD-10-CM | POA: Diagnosis not present

## 2019-01-10 ENCOUNTER — Other Ambulatory Visit: Payer: Self-pay

## 2019-01-10 ENCOUNTER — Ambulatory Visit
Admission: RE | Admit: 2019-01-10 | Discharge: 2019-01-10 | Disposition: A | Discharge status: Home / Self Care | Payer: Medicare Other | Source: Ambulatory Visit | Attending: Orthopaedic Surgery | Admitting: Orthopaedic Surgery

## 2019-01-10 DIAGNOSIS — M48061 Spinal stenosis, lumbar region without neurogenic claudication: Secondary | ICD-10-CM | POA: Diagnosis not present

## 2019-01-10 DIAGNOSIS — M5115 Intervertebral disc disorders with radiculopathy, thoracolumbar region: Secondary | ICD-10-CM | POA: Diagnosis not present

## 2019-01-10 DIAGNOSIS — M5136 Other intervertebral disc degeneration, lumbar region: Secondary | ICD-10-CM

## 2019-01-13 ENCOUNTER — Telehealth: Payer: Self-pay | Admitting: Pulmonary Disease

## 2019-01-13 ENCOUNTER — Inpatient Hospital Stay: Admission: RE | Admit: 2019-01-13 | Payer: Medicare Other | Source: Ambulatory Visit

## 2019-01-13 ENCOUNTER — Encounter: Payer: Medicare Other | Admitting: Acute Care

## 2019-01-13 NOTE — Telephone Encounter (Signed)
Called and spoke with Patient.  Patient stated her OV was cancelled with Judson Roch, NP yesterday. Patient stated she was told someone will call her at a later time to reschedule.  Patient stated she called CT and was told her lung screening CT was cancelled and would be rescheduled after she sees Judson Roch, NP.  Message routed to [facility]

## 2019-01-15 NOTE — Telephone Encounter (Signed)
Patient is aware that appts for ov and CT for lung screening have been pushed out 6-8 weeks Due to the covid19 virus this month March 2020. pts appt has been cancelled during this time of covid19 They will be rescheduled at end of June 2020 Nothing further needed at this time.

## 2019-01-22 DIAGNOSIS — F411 Generalized anxiety disorder: Secondary | ICD-10-CM | POA: Diagnosis not present

## 2019-01-22 DIAGNOSIS — F332 Major depressive disorder, recurrent severe without psychotic features: Secondary | ICD-10-CM | POA: Diagnosis not present

## 2019-01-28 DIAGNOSIS — B0223 Postherpetic polyneuropathy: Secondary | ICD-10-CM | POA: Diagnosis not present

## 2019-01-28 DIAGNOSIS — M5136 Other intervertebral disc degeneration, lumbar region: Secondary | ICD-10-CM | POA: Diagnosis not present

## 2019-01-28 DIAGNOSIS — M47896 Other spondylosis, lumbar region: Secondary | ICD-10-CM | POA: Diagnosis not present

## 2019-02-12 DIAGNOSIS — F319 Bipolar disorder, unspecified: Secondary | ICD-10-CM | POA: Diagnosis not present

## 2019-02-12 DIAGNOSIS — F411 Generalized anxiety disorder: Secondary | ICD-10-CM | POA: Diagnosis not present

## 2019-03-12 DIAGNOSIS — F319 Bipolar disorder, unspecified: Secondary | ICD-10-CM | POA: Diagnosis not present

## 2019-03-12 DIAGNOSIS — F411 Generalized anxiety disorder: Secondary | ICD-10-CM | POA: Diagnosis not present

## 2019-03-23 ENCOUNTER — Telehealth: Payer: Self-pay | Admitting: *Deleted

## 2019-03-23 DIAGNOSIS — F332 Major depressive disorder, recurrent severe without psychotic features: Secondary | ICD-10-CM | POA: Diagnosis not present

## 2019-03-23 DIAGNOSIS — F411 Generalized anxiety disorder: Secondary | ICD-10-CM | POA: Diagnosis not present

## 2019-03-23 NOTE — Telephone Encounter (Signed)

## 2019-03-24 ENCOUNTER — Ambulatory Visit (INDEPENDENT_AMBULATORY_CARE_PROVIDER_SITE_OTHER)
Admission: RE | Admit: 2019-03-24 | Discharge: 2019-03-24 | Disposition: A | Discharge status: Home / Self Care | Payer: Medicare Other | Source: Ambulatory Visit | Attending: Acute Care | Admitting: Acute Care

## 2019-03-24 ENCOUNTER — Ambulatory Visit (INDEPENDENT_AMBULATORY_CARE_PROVIDER_SITE_OTHER): Payer: Medicare Other | Admitting: Acute Care

## 2019-03-24 ENCOUNTER — Encounter: Payer: Self-pay | Admitting: Acute Care

## 2019-03-24 ENCOUNTER — Other Ambulatory Visit: Payer: Self-pay

## 2019-03-24 VITALS — BP 128/84 | HR 98 | Temp 98.1°F | Ht 68.5 in | Wt 208.5 lb

## 2019-03-24 DIAGNOSIS — F17210 Nicotine dependence, cigarettes, uncomplicated: Secondary | ICD-10-CM

## 2019-03-24 DIAGNOSIS — Z87891 Personal history of nicotine dependence: Secondary | ICD-10-CM | POA: Diagnosis not present

## 2019-03-24 DIAGNOSIS — Z122 Encounter for screening for malignant neoplasm of respiratory organs: Secondary | ICD-10-CM

## 2019-03-24 MED ORDER — TIOTROPIUM BROMIDE-OLODATEROL 2.5-2.5 MCG/ACT IN AERS: 2.0000 | INHALATION_SPRAY | Freq: Every day | RESPIRATORY_TRACT | 2 refills | Status: DC

## 2019-03-24 MED ORDER — ALBUTEROL SULFATE 108 (90 BASE) MCG/ACT IN AEPB: 2.0000 | INHALATION_SPRAY | Freq: Four times a day (QID) | RESPIRATORY_TRACT | 0 refills | Status: AC | PRN

## 2019-03-24 MED ORDER — TIOTROPIUM BROMIDE MONOHYDRATE 2.5 MCG/ACT IN AERS: 2.0000 | INHALATION_SPRAY | Freq: Every day | RESPIRATORY_TRACT | 0 refills | Status: DC

## 2019-03-24 NOTE — Progress Notes (Signed)
Shared Decision Making Visit Lung Cancer Screening Program [phone])   Eligibility:  Age 73 y.o.  Pack Years Smoking History Calculation 58 pack year smoking history (# packs/per year x # years smoked)  Recent History of coughing up blood  no  Unexplained weight loss? no ( >Than 15 pounds within the last 6 months )  Prior History Lung / other cancer no (Diagnosis within the last 5 years already requiring surveillance chest CT Scans).  Smoking Status Former  Former Smokers: Years since quit: < 1 year, 2 months  Quit Date: 12/2018  Visit Components:  Discussion included one or more decision making aids. yes  Discussion included risk/benefits of screening. yes  Discussion included potential follow up diagnostic testing for abnormal scans. yes  Discussion included meaning and risk of over diagnosis. yes  Discussion included meaning and risk of False Positives. yes  Discussion included meaning of total radiation exposure. yes  Counseling Included:  Importance of adherence to annual lung cancer LDCT screening. yes  Impact of comorbidities on ability to participate in the program. yes  Ability and willingness to under diagnostic treatment. yes  Smoking Cessation Counseling:  Current Smokers:   Discussed importance of smoking cessation. yes  Information about tobacco cessation classes and interventions provided to patient. yes  Patient provided with "ticket" for LDCT Scan. yes  Symptomatic Patient. No  Counseling  Diagnosis Code: Tobacco Use Z72.0  Asymptomatic Patient yes  Counseling (Intermediate counseling: > three minutes counseling) Q5956  Former Smokers:   Discussed the importance of maintaining cigarette abstinence. yes  Diagnosis Code: Personal History of Nicotine Dependence. L87.564  Information about tobacco cessation classes and interventions provided to patient. Yes  Patient provided with "ticket" for LDCT Scan. yes  Written Order for Lung  Cancer Screening with LDCT placed in Epic. Yes (CT Chest Lung Cancer Screening Low Dose W/O CM) [id number] Z12.2-Screening of respiratory organs Z87.891-Personal history of nicotine dependence  I have spent 25 minutes of face to face time with Lori Day discussing the risks and benefits of lung cancer screening. We viewed a power point together that explained in detail the above noted topics. We paused at intervals to allow for questions to be asked and answered to ensure understanding.We discussed that the single most powerful action that Lori Day can take to decrease her risk of developing lung cancer is to quit smoking. We discussed whether or not Lori Day is ready to commit to setting a quit date. We discussed options for tools to aid in quitting smoking including nicotine replacement therapy, non-nicotine medications, support groups, Quit Smart classes, and behavior modification. We discussed that often times setting smaller, more achievable goals, such as eliminating 1 cigarette a day for a week and then 2 cigarettes a day for a week can be helpful in slowly decreasing the number of cigarettes smoked. This allows for a sense of accomplishment as well as providing a clinical benefit. I gave her the " Be Stronger Than Your Excuses" card with contact information for community resources, classes, free nicotine replacement therapy, and access to mobile apps, text messaging, and on-line smoking cessation help. I have also given her my card and contact information in the event Lori Day needs to contact me. We discussed the time and location of the scan, and that either Doroteo Glassman RN or I will call with the results within 24-48 hours of receiving them. I have offered her  a copy of the power point we viewed  as a resource in the event they  need reinforcement of the concepts we discussed today in the office. The patient verbalized understanding of all of  the above and had no further questions upon leaving the office. They have  my contact information in the event they have any further questions.  I spent 3 minutes counseling on smoking cessation and the health risks of continued tobacco abuse.  I explained to the patient that there has been a high incidence of coronary artery disease noted on these exams. I explained that this is a non-gated exam therefore degree or severity cannot be determined. This patient is not currently on statin therapy. I have asked the patient to follow-up with their PCP regarding any incidental finding of coronary artery disease and management with diet or medication as their PCP  feels is clinically indicated. The patient verbalized understanding of the above and had no further questions upon completion of the visit.   Lori Day states the pandemic has actually helped her quit as Lori Day has been unable to get out to buy cigarettes. Lori Day has been smoke free x 2 months. I have congratulated her.   Magdalen Spatz, NP 03/24/2019 1:25 PM

## 2019-03-26 ENCOUNTER — Other Ambulatory Visit: Payer: Self-pay | Admitting: Acute Care

## 2019-03-26 ENCOUNTER — Telehealth: Payer: Self-pay | Admitting: Acute Care

## 2019-03-26 MED ORDER — TIOTROPIUM BROMIDE-OLODATEROL 2.5-2.5 MCG/ACT IN AERS: 2.0000 | INHALATION_SPRAY | Freq: Every day | RESPIRATORY_TRACT | 0 refills | Status: DC

## 2019-03-26 NOTE — Telephone Encounter (Signed)
Returned call to patient. Patient states she received spiriva samples x 2 and requested stiolto.  Patient states she noted the sample was different and did not open it.  She will return samples to [place] and pick up stiolto on Monday.  New samples placed at front desk for pick up.  Nothing further needed. Apology to patient for sample error. Patient was very understanding.

## 2019-04-01 ENCOUNTER — Telehealth: Payer: Self-pay | Admitting: Acute Care

## 2019-04-01 NOTE — Telephone Encounter (Signed)
Spoke with pt and advised her of her CT results and mailed her a copy to her address, Nothing further is needed.

## 2019-04-02 ENCOUNTER — Other Ambulatory Visit: Payer: Self-pay | Admitting: Acute Care

## 2019-04-02 DIAGNOSIS — Z122 Encounter for screening for malignant neoplasm of respiratory organs: Secondary | ICD-10-CM

## 2019-04-02 DIAGNOSIS — F17210 Nicotine dependence, cigarettes, uncomplicated: Secondary | ICD-10-CM

## 2019-04-09 DIAGNOSIS — F411 Generalized anxiety disorder: Secondary | ICD-10-CM | POA: Diagnosis not present

## 2019-04-09 DIAGNOSIS — F319 Bipolar disorder, unspecified: Secondary | ICD-10-CM | POA: Diagnosis not present

## 2019-04-26 DIAGNOSIS — Z0000 Encounter for general adult medical examination without abnormal findings: Secondary | ICD-10-CM | POA: Diagnosis not present

## 2019-04-26 DIAGNOSIS — R0602 Shortness of breath: Secondary | ICD-10-CM | POA: Diagnosis not present

## 2019-04-26 DIAGNOSIS — E559 Vitamin D deficiency, unspecified: Secondary | ICD-10-CM | POA: Diagnosis not present

## 2019-04-26 DIAGNOSIS — F3130 Bipolar disorder, current episode depressed, mild or moderate severity, unspecified: Secondary | ICD-10-CM | POA: Diagnosis not present

## 2019-04-26 DIAGNOSIS — E890 Postprocedural hypothyroidism: Secondary | ICD-10-CM | POA: Diagnosis not present

## 2019-04-26 DIAGNOSIS — J449 Chronic obstructive pulmonary disease, unspecified: Secondary | ICD-10-CM | POA: Diagnosis not present

## 2019-04-26 DIAGNOSIS — G629 Polyneuropathy, unspecified: Secondary | ICD-10-CM | POA: Diagnosis not present

## 2019-04-26 DIAGNOSIS — M810 Age-related osteoporosis without current pathological fracture: Secondary | ICD-10-CM | POA: Diagnosis not present

## 2019-04-26 DIAGNOSIS — K219 Gastro-esophageal reflux disease without esophagitis: Secondary | ICD-10-CM | POA: Diagnosis not present

## 2019-04-26 DIAGNOSIS — E7800 Pure hypercholesterolemia, unspecified: Secondary | ICD-10-CM | POA: Diagnosis not present

## 2019-05-03 DIAGNOSIS — Z961 Presence of intraocular lens: Secondary | ICD-10-CM | POA: Diagnosis not present

## 2019-05-03 DIAGNOSIS — H04123 Dry eye syndrome of bilateral lacrimal glands: Secondary | ICD-10-CM | POA: Diagnosis not present

## 2019-05-03 DIAGNOSIS — H524 Presbyopia: Secondary | ICD-10-CM | POA: Diagnosis not present

## 2019-05-03 DIAGNOSIS — H40053 Ocular hypertension, bilateral: Secondary | ICD-10-CM | POA: Diagnosis not present

## 2019-05-07 DIAGNOSIS — F411 Generalized anxiety disorder: Secondary | ICD-10-CM | POA: Diagnosis not present

## 2019-05-07 DIAGNOSIS — F319 Bipolar disorder, unspecified: Secondary | ICD-10-CM | POA: Diagnosis not present

## 2019-06-02 ENCOUNTER — Telehealth: Payer: Self-pay

## 2019-06-02 NOTE — Telephone Encounter (Signed)
Called Patient to schedule a new patient appt, pt did not answer lvm.

## 2019-06-04 DIAGNOSIS — F319 Bipolar disorder, unspecified: Secondary | ICD-10-CM | POA: Diagnosis not present

## 2019-06-04 DIAGNOSIS — F411 Generalized anxiety disorder: Secondary | ICD-10-CM | POA: Diagnosis not present

## 2019-06-20 NOTE — Progress Notes (Signed)
Cardiology Office Note:   Date:  06/21/2019  NAME:  Lori Day    MRN: [id number] DOB:  Mar 09, 1946   PCP:  Lori Stalker, PA-C  Cardiologist:  Lori Field, MD  Electrophysiologist:  None   Referring MD: Lori Stalker, PA-C   Chief Complaint  Patient presents with  . Chest Pain   History of Present Illness:   Lori Day is a 73 y.o. female with a hx of mild COPD, prior tongue cancer, GERD, arthritis who is being seen today for the evaluation of exertional shortness of breath at the request of Lori Day, [place].  She reports a recent CT scan to screen for lung cancer, and it was noted that she had aortic atherosclerosis, as well as coronary atherosclerosis.  She reports this was quite worrisome for her.  She reports she is quit smoking, and has had a diagnosis of mild COPD per pulmonologist.  She has recently started to go back to college and has been less mobile than recently.  She has noticed increased lower extremity edema at night.  She also reports over the past few months chest pain that occurs at night.  The pain is reported that sharp fleeting pain at last several seconds and goes away.  It occurs maybe 1 time per every 2 months.  The pain is occurred on and off for nearly 2 to 3 years.  She states she is recently bought an exercise bike, and recently has gone up to 12 minutes on this, completing 2 miles with exertion.  She reports no chest pain/pressure with this, but is quite short of breath with this.  She has a long history of being a smoker, with nearly 50 pack years, but has recently quit.  Most recent LDL cholesterol 125, and is recently started on a statin.  Serum creatinine 0.71.  She reports no symptoms of orthopnea, PND, or any other symptoms.  Past Medical History: Past Medical History:  Diagnosis Date  . Arthritis   . Bipolar 1 disorder ([place])   . 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 She quit in 2013.   Marland Kitchen GERD  (gastroesophageal reflux disease)   . Glaucoma   . Hiatal hernia   . History of HPV infection   . Hx of chronic sinusitis   . 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 Sciatica   . Sensory disturbance 09/19/2014  . Sensory hearing loss, bilateral   . Sjogren's syndrome ([place]) 09-02-2007  . 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 Thyroid nodule   . Tongue cancer ([place]) 02/02/2013   HPV/ radiation/chemo    Past Surgical History: Past Surgical History:  Procedure Laterality Date  . ABDOMINAL HYSTERECTOMY    . APPENDECTOMY  1959  . BREAST BIOPSY     three times, twice on Left, once on Right, diagnosis as calcifications 1994-1999  . CATARACT EXTRACTION Bilateral 05-29-10, 06-14-10  . CHOLECYSTECTOMY    . KNEE SURGERY  08-15-11   arthoscopy  . tongue cancer    . TONSILECTOMY, ADENOIDECTOMY, BILATERAL MYRINGOTOMY AND TUBES  1955  . TUBAL LIGATION  1981  . turbinate surgery     Dr. Nicole Cella in [place].    Current Medications: Current Meds  Medication Sig  . Albuterol Sulfate (PROAIR RESPICLICK) 517 (90 Base) MCG/ACT AEPB Inhale 2 puffs into the lungs every 6 (six) hours as needed.  Marland Kitchen atorvastatin (LIPITOR) 20 MG tablet Take 20 mg by  mouth daily.  Marland Kitchen b complex vitamins tablet Bid po OTC  . buPROPion (WELLBUTRIN XL) 150 MG 24 hr tablet TAKE 1 TABLET BY MOUTH EVERY MORNING FOR DEPRESSION  . Calcium Carb-Cholecalciferol (CALCIUM PLUS VITAMIN D3) 600-800 MG-UNIT TABS   . cetirizine (ZYRTEC) 10 MG tablet Take 10 mg by mouth daily as needed for allergies.  Marland Kitchen FLUoxetine (PROZAC) 20 MG capsule Take 20 mg by mouth.  Marland Kitchen GLUCOSA-CHONDR-NA CHONDR-MSM PO Take by mouth daily. W/ vit d  . levothyroxine (SYNTHROID, LEVOTHROID) 75 MCG tablet Take 1 tablet (75 mcg total) by mouth daily before breakfast.  . omeprazole (PRILOSEC) 20 MG capsule Take 20 mg by mouth every other day.   Vladimir Faster Glycol-Propyl Glycol (SYSTANE OP) Apply to eye as needed.  . Tiotropium  Bromide-Olodaterol (STIOLTO RESPIMAT) 2.5-2.5 MCG/ACT AERS Inhale 2 puffs into the lungs daily.  . TURMERIC PO Take by mouth.  . valACYclovir (VALTREX) 1000 MG tablet Take 1,000 mg by mouth 2 (two) times daily.  . Zoledronic Acid (RECLAST IV) Inject into the vein. 1 time per year     Allergies:    Aspirin, Compazine [prochlorperazine edisylate], Pseudophed-chlophedianol-gg, Seroquel [quetiapine fumarate], Latex, and Tape   Social History: Social History   Socioeconomic History  . Marital status: Divorced    Spouse name: Not on file  . Number of children: 2  . Years of education: 24  . Highest education level: Not on file  Occupational History  . Occupation: retired    Comment: criminal justice  Social Needs  . Financial resource strain: Not on file  . Food insecurity    Worry: Not on file    Inability: Not on file  . Transportation needs    Medical: Not on file    Non-medical: Not on file  Tobacco Use  . Smoking status: Former Smoker    Packs/day: 1.00    Years: 50.00    Pack years: 50.00    Types: Cigarettes    Quit date: 07/01/2018    Years since quitting: 0.9  . Smokeless tobacco: Never Used  . Tobacco comment: quit 07/01/18  Substance and Sexual Activity  . Alcohol use: Yes    Comment: craft beer 2 times week  . Drug use: No  . Sexual activity: Not on file  Lifestyle  . Physical activity    Days per week: Not on file    Minutes per session: Not on file  . Stress: Not on file  Relationships  . Social Herbalist on phone: Not on file    Gets together: Not on file    Attends religious service: Not on file    Active member of club or organization: Not on file    Attends meetings of clubs or organizations: Not on file    Relationship status: Not on file  Other Topics Concern  . Not on file  Social History Narrative   Caffeine 2 cups daily, Single, 2 kids, retired.     Family History: The patient's family history includes Asthma in an other family  member; Breast cancer in her cousin; Breast cancer (age of onset: 43) in her paternal grandmother; COPD in her father and paternal grandfather; Emphysema in her paternal grandfather; Heart attack (age of onset: 33) in her maternal grandfather; Ovarian cancer (age of onset: 52) in her sister; Pancreatic cancer in her maternal grandmother; Pancreatic cancer (age of onset: 77) in her mother; Prostate cancer in her maternal uncle and maternal uncle; Thyroid cancer (age  of onset: 51) in her father.  ROS:   All other ROS reviewed and negative. Pertinent positives noted in the HPI.     EKGs/Labs/Other Studies Reviewed:   The following studies were personally reviewed by me today:  LDL 125 HDL 41 T chol 207  CT lung screen (03/24/2019): mild LAD calcification noted, aortic atherosclerosis noted  EKG:  EKG is ordered today.  The ekg ordered today demonstrates normal sinus rhythm, heart rate 73, normal intervals, nonspecific T wave abnormalities noted, no acute ischemic changes, no prior infarction, and was personally reviewed by me.   Recent Labs: No results found for requested labs within last 8760 hours.   Recent Lipid Panel No results found for: CHOL, TRIG, HDL, CHOLHDL, VLDL, LDLCALC, LDLDIRECT  Physical Exam:   VS:  BP 136/84   Pulse 73   Ht 5' 8.5" (1.74 m)   Wt 208 lb 12.8 oz (94.7 kg)   SpO2 92%   BMI 31.29 kg/m    Wt Readings from Last 3 Encounters:  06/21/19 208 lb 12.8 oz (94.7 kg)  03/24/19 208 lb 8 oz (94.6 kg)  12/15/18 208 lb (94.3 kg)    General: Well nourished, well developed, in no acute distress Heart: Atraumatic, normal size  Eyes: PEERLA, EOMI  Neck: Supple, no JVD Endocrine: No thryomegaly Cardiac: Normal S1, S2; RRR; no murmurs, rubs, or gallops Lungs: Diminished breath sounds bilaterally, no rales Abd: Soft, nontender, no hepatomegaly  Ext: Trace edema, pulses 2+ Musculoskeletal: No deformities, BUE and BLE strength normal and equal Skin: Warm and dry, no  rashes   Neuro: Alert and oriented to person, place, time, and situation, CNII-XII grossly intact, no focal deficits  Psych: Normal mood and affect   ASSESSMENT:   NAME@ is a 73 y.o. female who presents for the following: 1. SOB (shortness of breath) on exertion   2. Chest pain of uncertain etiology   3. Leg edema   4. Mixed hyperlipidemia   5. Obesity, morbid ([place])   6. Aortic atherosclerosis ([facility])    PLAN:   1. SOB (shortness of breath) on exertion 2. Chest pain of uncertain etiology -With shortness of breath with exertion, which likely is related to her COPD.  She reports atypical chest pain that occurs at night, sharp in character.  She is recently started exercise program and is doing quite well, up to 2 miles without any symptoms of chest pain or worsening shortness of breath. -We will start with an echocardiogram, and see her back in a few months after she continues to exercise to determine if her symptoms worsen or get better -I discussed her possible need for stress test, but we will not pursue it at this time  3. Leg edema -Trace and likely related to immobility -We will obtain an echocardiogram  4. Mixed hyperlipidemia -Most recent LDL 125 started on a statin -We will aim for a goal less than 70 given aortic atherosclerosis and coronary calcium -She will present to her next appointment with me fasting and we will check her cholesterol then  5. Obesity, morbid ([place]) -Counseled on the importance of weight loss  6. Aortic atherosclerosis ([place]) -She will continue statin therapy -She is currently allergic to aspirin, and will hold on this for now   Disposition: Return in about 3 months (around 09/21/2019).  Medication Adjustments/Labs and Tests Ordered: Current medicines are reviewed at length with the patient today.  Concerns regarding medicines are outlined above.  Orders Placed This Encounter  Procedures  .  Lipid panel  . EKG 12-Lead  . ECHOCARDIOGRAM COMPLETE    No orders of the defined types were placed in this encounter.   Patient Instructions  Medication Instructions:  no changes at present time  Lab work:    Nov 2020 - labs prior to office visit- lipid - fasting  Noting to eat or drink the morning of the  test   If you have labs (blood work) drawn today and your tests are completely normal, you will receive your results only by: Marland Kitchen MyChart Message (if you have MyChart) OR . A paper copy in the mail If you have any lab test that is abnormal or we need to change your treatment, we will call you to review the results.  Testing/Procedures:  will be schedule at [facility] has requested that you have an echocardiogram. Echocardiography is a painless test that uses sound waves to create images of your heart. It provides your doctor with information about the size and shape of your heart and how well your heart's chambers and valves are working. This procedure takes approximately one hour. There are no restrictions for this procedure.    Follow-Up: At [facility], you and your health needs are our priority.  As part of our continuing mission to provide you with exceptional heart care, we have created designated Provider Care Teams.  These Care Teams include your primary Cardiologist (physician) and Advanced Practice Providers (APPs -  Physician Assistants and Nurse Practitioners) who all work together to provide you with the care you need, when you need it. . Dr Audie Box recommends that you schedule a follow-up appointment in 3 months - Nov 2021     Any Other Special Instructions Will Be Listed Below .N/A    Signed, [place] T. Audie Box, [place]  [address] [place],  [address] [phone]  06/21/2019 2:37 PM

## 2019-06-21 ENCOUNTER — Other Ambulatory Visit: Payer: Self-pay

## 2019-06-21 ENCOUNTER — Encounter: Payer: Self-pay | Admitting: Cardiovascular Disease

## 2019-06-21 ENCOUNTER — Ambulatory Visit (INDEPENDENT_AMBULATORY_CARE_PROVIDER_SITE_OTHER): Payer: Medicare Other | Admitting: Cardiovascular Disease

## 2019-06-21 VITALS — BP 136/84 | HR 73 | Ht 68.5 in | Wt 208.8 lb

## 2019-06-21 DIAGNOSIS — R0789 Other chest pain: Secondary | ICD-10-CM

## 2019-06-21 DIAGNOSIS — E782 Mixed hyperlipidemia: Secondary | ICD-10-CM

## 2019-06-21 DIAGNOSIS — R079 Chest pain, unspecified: Secondary | ICD-10-CM

## 2019-06-21 DIAGNOSIS — I700 Atherosclerosis of aorta: Secondary | ICD-10-CM

## 2019-06-21 DIAGNOSIS — R0602 Shortness of breath: Secondary | ICD-10-CM

## 2019-06-21 DIAGNOSIS — R600 Localized edema: Secondary | ICD-10-CM | POA: Diagnosis not present

## 2019-06-21 NOTE — Patient Instructions (Signed)
Medication Instructions:  no changes at present time  Lab work:    Nov 2020 - labs prior to office visit- lipid - fasting  Noting to eat or drink the morning of the  test   If you have labs (blood work) drawn today and your tests are completely normal, you will receive your results only by: Marland Kitchen MyChart Message (if you have MyChart) OR . A paper copy in the mail If you have any lab test that is abnormal or we need to change your treatment, we will call you to review the results.  Testing/Procedures:  will be schedule at [facility] has requested that you have an echocardiogram. Echocardiography is a painless test that uses sound waves to create images of your heart. It provides your doctor with information about the size and shape of your heart and how well your heart's chambers and valves are working. This procedure takes approximately one hour. There are no restrictions for this procedure.    Follow-Up: At [facility], you and your health needs are our priority.  As part of our continuing mission to provide you with exceptional heart care, we have created designated Provider Care Teams.  These Care Teams include your primary Cardiologist (physician) and Advanced Practice Providers (APPs -  Physician Assistants and Nurse Practitioners) who all work together to provide you with the care you need, when you need it. . Dr Audie Box recommends that you schedule a follow-up appointment in 3 months - Nov 2021     Any Other Special Instructions Will Be Listed Below .N/A

## 2019-06-22 ENCOUNTER — Telehealth: Payer: Self-pay | Admitting: Cardiovascular Disease

## 2019-06-22 ENCOUNTER — Ambulatory Visit (HOSPITAL_COMMUNITY): Payer: Medicare Other | Attending: Cardiology

## 2019-06-22 DIAGNOSIS — R0602 Shortness of breath: Secondary | ICD-10-CM | POA: Diagnosis not present

## 2019-06-22 MED ORDER — PERFLUTREN LIPID MICROSPHERE
1.0000 mL | INTRAVENOUS | Status: AC | PRN
  Administered 2019-06-22: 1 mL via INTRAVENOUS

## 2019-06-22 NOTE — Telephone Encounter (Signed)
Called and informed Mrs. Doane that her echo is normal.   Evalina Field, MD

## 2019-07-09 DIAGNOSIS — F411 Generalized anxiety disorder: Secondary | ICD-10-CM | POA: Diagnosis not present

## 2019-07-09 DIAGNOSIS — F319 Bipolar disorder, unspecified: Secondary | ICD-10-CM | POA: Diagnosis not present

## 2019-07-30 DIAGNOSIS — Z01419 Encounter for gynecological examination (general) (routine) without abnormal findings: Secondary | ICD-10-CM | POA: Diagnosis not present

## 2019-08-13 DIAGNOSIS — F411 Generalized anxiety disorder: Secondary | ICD-10-CM | POA: Diagnosis not present

## 2019-08-13 DIAGNOSIS — F319 Bipolar disorder, unspecified: Secondary | ICD-10-CM | POA: Diagnosis not present

## 2019-08-16 DIAGNOSIS — F332 Major depressive disorder, recurrent severe without psychotic features: Secondary | ICD-10-CM | POA: Diagnosis not present

## 2019-08-16 DIAGNOSIS — F411 Generalized anxiety disorder: Secondary | ICD-10-CM | POA: Diagnosis not present

## 2019-08-24 DIAGNOSIS — Z8781 Personal history of (healed) traumatic fracture: Secondary | ICD-10-CM | POA: Diagnosis not present

## 2019-08-24 DIAGNOSIS — Z79899 Other long term (current) drug therapy: Secondary | ICD-10-CM | POA: Diagnosis not present

## 2019-08-24 DIAGNOSIS — Z1321 Encounter for screening for nutritional disorder: Secondary | ICD-10-CM | POA: Diagnosis not present

## 2019-08-24 DIAGNOSIS — Z87891 Personal history of nicotine dependence: Secondary | ICD-10-CM | POA: Diagnosis not present

## 2019-08-24 DIAGNOSIS — E559 Vitamin D deficiency, unspecified: Secondary | ICD-10-CM | POA: Diagnosis not present

## 2019-08-24 DIAGNOSIS — M810 Age-related osteoporosis without current pathological fracture: Secondary | ICD-10-CM | POA: Diagnosis not present

## 2019-08-25 ENCOUNTER — Other Ambulatory Visit: Payer: Self-pay | Admitting: Obstetrics & Gynecology

## 2019-08-25 DIAGNOSIS — Z1231 Encounter for screening mammogram for malignant neoplasm of breast: Secondary | ICD-10-CM

## 2019-09-09 ENCOUNTER — Telehealth: Payer: Self-pay | Admitting: Cardiovascular Disease

## 2019-09-09 NOTE — Telephone Encounter (Signed)
Patient would like to know if she can switch her current in office appt scheduled on 11/17 to a virtual appt.

## 2019-09-09 NOTE — Telephone Encounter (Signed)
D/w Dr Audie Box ok to change to virtual visit  Returned call to pt she will be ready for call sometime betweebn 1-115pm

## 2019-09-10 DIAGNOSIS — E782 Mixed hyperlipidemia: Secondary | ICD-10-CM | POA: Diagnosis not present

## 2019-09-10 LAB — LIPID PANEL
Chol/HDL Ratio: 3.2 ratio (ref 0.0–4.4)
Cholesterol, Total: 144 mg/dL (ref 100–199)
HDL: 45 mg/dL (ref >39)
LDL Chol Calc (NIH): 79 mg/dL (ref 0–99)
Triglycerides: 111 mg/dL (ref 0–149)
VLDL Cholesterol Cal: 20 mg/dL (ref 5–40)

## 2019-09-13 NOTE — Progress Notes (Signed)
Cardiology Telehealth Visit       Virtual Visit via Telephone Note   This visit type was conducted due to national recommendations for restrictions regarding the COVID-19 Pandemic (e.g. social distancing) in an effort to limit this patient's exposure and mitigate transmission in our community.  Due to her co-morbid illnesses, this patient is at least at moderate risk for complications without adequate follow up.  This format is felt to be most appropriate for this patient at this time.  The patient did not have access to video technology/had technical difficulties with video requiring transitioning to audio format only (telephone).  All issues noted in this document were discussed and addressed.  No physical exam could be performed with this format.  Please refer to the patient's chart for her  consent to telehealth for [facility].   Date:  09/14/2019  NAME:  Lori Day    MRN: [id number] DOB:  18-Sep-1946  Patient Location: Home Provider Location: Office  PCP:  Marda Stalker, PA-C  Cardiologist:  Evalina Field, MD   Evaluation Performed:  Follow-Up Visit  Chief Complaint:  Hyperlipidemia   History of Present Illness:    Lori Day is a 73 y.o. female with COPD, tongue cancer, GERD, arthritis who presents for follow-up of shortness of breath/chest pain. Her chest pain was atypical and she was able to exercise up to 2 miles without issues. No stress test performed. Echocardiogram unremarkable. She was noted to have coronary calcium on a CT lung cancer screen, and statin started. LDL 79.   She reports she is doing well since her last visit.  She has returned back to school and is taking online courses.  Due to this she is relatively inactive.  She does report the chest pain she reported to me in August has resolved.  She does get short of breath with exertion but is not exerting herself nearly as much as she used to.  She reports she is going to try to start a structured  exercise program soon.  Her COPD is mild per pulmonology report.  With her current level of activity she does report no episodes of chest pain, palpitations, shortness of breath.  She does report lower extremity edema, left greater than right.  She states that she had trauma to her left leg in the past and this is the result for the asymmetric swelling.  She reports numerous DVT studies that have been negative for any blood clot.  I did offer her this again but she reports she will decline this as she never has a blood clot.  Overall, she appears to be in good state of health.  This assessment was performed by phone, and no visual assessment was made of her lower extremities.  She does reports that starting her Lipitor a few months ago she is noticed hair loss.  This is not a common side effect reported with this medication but this is only change that she has noted.  I instructed her we will switch her to Crestor.  The patient does not have symptoms concerning for COVID-19 infection (fever, chills, cough, or new shortness of breath).   Past Medical History: Past Medical History:  Diagnosis Date   Arthritis    Bipolar 1 disorder ([place])    Cancer of tongue ([place]) 09/19/2014   HPV infection, and ex smoker. She quit in 2013.    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 Nicole Cella in [place].    Current Medications: Current Meds  Medication Sig   Albuterol Sulfate (PROAIR RESPICLICK) 240 (90 Base) MCG/ACT AEPB Inhale 2 puffs into the lungs every 6 (six) hours as needed.   b complex vitamins tablet Bid po OTC (Patient taking differently: 1 tablet daily. OTC)   buPROPion (WELLBUTRIN XL) 150 MG 24 hr tablet TAKE 1 TABLET BY MOUTH EVERY MORNING FOR DEPRESSION   Calcium Carb-Cholecalciferol (CALCIUM PLUS VITAMIN D3) 600-800 MG-UNIT TABS    cetirizine (ZYRTEC) 10 MG tablet Take 10 mg by mouth daily as needed for allergies.   FLUoxetine (PROZAC) 20 MG capsule Take 20 mg by mouth.   GLUCOSA-CHONDR-NA CHONDR-MSM PO Take by mouth daily. W/ vit d   levothyroxine (SYNTHROID, LEVOTHROID) 75 MCG tablet Take 1 tablet (75 mcg total) by mouth daily before breakfast.   omeprazole (PRILOSEC) 20 MG capsule Take 20 mg by mouth every other day.    Polyethyl Glycol-Propyl Glycol (SYSTANE OP) Apply to eye as needed.   Tiotropium Bromide-Olodaterol (STIOLTO RESPIMAT) 2.5-2.5 MCG/ACT AERS Inhale 2 puffs into the lungs daily.   TURMERIC PO Take by mouth.   valACYclovir (VALTREX) 1000 MG tablet Take 1,000 mg by mouth 2 (two) times daily. PRN   [DISCONTINUED] atorvastatin (LIPITOR) 20 MG tablet Take 20 mg by mouth daily.     Allergies:    Aspirin, Compazine [prochlorperazine edisylate], Pseudophed-chlophedianol-gg, Seroquel [quetiapine fumarate], Latex, and Tape   Social History: Social History   Socioeconomic History   Marital status: Divorced    Spouse name: Not on file   Number of children: 2   Years of education: 14   Highest education level: Not on file  Occupational History   Occupation: retired     Comment: criminal Metallurgist strain: Not on file   Food insecurity    Worry: Not on file    Inability: Not on Lexicographer needs    Medical: Not on file    Non-medical: Not on file  Tobacco Use   Smoking status: Former Smoker    Packs/day: 1.00    Years: 50.00    Pack years: 50.00    Types: Cigarettes    Quit date: 07/01/2018    Years since quitting: 1.2   Smokeless tobacco: Never Used   Tobacco comment: quit 07/01/18  Substance and Sexual Activity   Alcohol use: Yes    Comment: craft beer 2 times week   Drug use: No   Sexual activity: Not on file  Lifestyle   Physical activity    Days per week: Not on file    Minutes per session: Not on file   Stress: Not on file  Relationships   Social connections    Talks on phone: Not on file    Gets together: Not on file    Attends religious service: Not on file    Active member of club or organization: Not on file    Attends meetings of clubs or organizations: Not on file    Relationship status: Not on file  Other Topics Concern   Not on file  Social History Narrative   Caffeine 2 cups daily, Single, 2 kids, retired.     Family History: The patient's family history includes Asthma in an other family member; Breast cancer in her cousin; Breast cancer (age of onset: 59) in her paternal grandmother; COPD in her father and paternal grandfather; Emphysema in her paternal grandfather; Heart attack (age of onset: 82) in her maternal grandfather; Ovarian cancer (age of onset: 28) in her sister; Pancreatic cancer in her maternal grandmother; Pancreatic cancer (age of onset: 3) in her mother; Prostate cancer in her maternal uncle and maternal uncle; Thyroid cancer (age of onset: 21) in her father.  ROS:   All other ROS reviewed and negative. Pertinent positives noted in the HPI.     Prior CV studies:   The following studies were reviewed today:  TTE 06/22/2019  1. The left ventricle  has normal systolic function with an ejection fraction of 60-65%. The cavity size was normal. There is mild concentric left ventricular hypertrophy. Left ventricular diastolic parameters were normal.  2. The right ventricle has normal systolic function. The cavity was normal. There is no increase in right ventricular wall thickness.  3. The interatrial septum appears to be lipomatous.  Labs/Other Tests and Data Reviewed:    Recent Labs: No results found for requested labs within last 8760 hours.   Recent Lipid Panel Lab Results  Component Value Date/Time   CHOL 144 09/10/2019 09:08 AM   TRIG 111 09/10/2019 09:08 AM   HDL 45 09/10/2019 09:08 AM   CHOLHDL 3.2 09/10/2019 09:08 AM   LDLCALC 79 09/10/2019 09:08 AM    Wt Readings from Last 3 Encounters:  09/14/19 213 lb (96.6 kg)  06/21/19 208 lb 12.8 oz (94.7 kg)  03/24/19 208 lb 8 oz (94.6 kg)     Objective:    Vital Signs:  Ht 5' 8.5" (1.74 m)    Wt 213 lb (96.6 kg)    BMI 31.92 kg/m    ASSESSMENT & PLAN:    1. SOB (shortness of breath) on exertion 2. Chest pain of uncertain etiology -Most recent echocardiogram with normal left ventricular ejection fraction and normal diastolic function.  No significant valvular heart disease was noted.  She is no longer having episodes of chest pain and we will just continue to monitor this for now.  She is not as active as she used to be and have encouraged her to resume her activity level.  She is free to exercise as she feels to do.  We will plan to see her back in 6 months in person to reassess symptoms to determine if any other testing needs to be performed.  3. Coronary artery disease involving native coronary artery of native heart without angina pectoris -Coronary calcium seen on a recent CT -Most recent cholesterol with LDL 79.  We will switch her to Crestor she reports some hair loss with  possibly atorvastatin.  We also continue aspirin.  4. Mixed hyperlipidemia -LDL goal less than  70.  Most recent LDL 79.  With the switch to Crestor which is high intensity statin we will get there.  5. Leg edema -Likely related to remote trauma of her leg.  She reports left greater than right.  I offered her a DVT duplex to rule out blood clot but she is not interested.  She reports that she has had numerous studies over the years and have all been normal.  We will prescribe Lasix 20 mg every day as needed.  We will see her back in 6 months and reassess symptoms.  COVID-19 Education: The signs and symptoms of COVID-19 were discussed with the patient and how to seek care for testing (follow up with PCP or arrange E-visit).  The importance of social distancing was discussed today.  Time:   Today, I have spent 25 minutes with the patient with telehealth technology discussing the above problems.    Medication Adjustments/Labs and Tests Ordered: Current medicines are reviewed at length with the patient today.  Concerns regarding medicines are outlined above.   Tests Ordered: No orders of the defined types were placed in this encounter.   Medication Changes: Meds ordered this encounter  Medications   furosemide (LASIX) 20 MG tablet    Sig: Take 1 tablet (20 mg total) by mouth daily as needed for edema.    Dispense:  90 tablet    Refill:  3   rosuvastatin (CRESTOR) 20 MG tablet    Sig: Take 1 tablet (20 mg total) by mouth daily.    Dispense:  90 tablet    Refill:  3    Follow Up:  In Person in 6 month(s)  Signed, Addison Naegeli. Audie Box, [place]  09/14/2019 1:51 PM

## 2019-09-14 ENCOUNTER — Encounter: Payer: Self-pay | Admitting: Cardiovascular Disease

## 2019-09-14 ENCOUNTER — Telehealth (INDEPENDENT_AMBULATORY_CARE_PROVIDER_SITE_OTHER): Payer: Medicare Other | Admitting: Cardiovascular Disease

## 2019-09-14 VITALS — Ht 68.5 in | Wt 213.0 lb

## 2019-09-14 DIAGNOSIS — R0602 Shortness of breath: Secondary | ICD-10-CM

## 2019-09-14 DIAGNOSIS — R079 Chest pain, unspecified: Secondary | ICD-10-CM

## 2019-09-14 DIAGNOSIS — E782 Mixed hyperlipidemia: Secondary | ICD-10-CM

## 2019-09-14 DIAGNOSIS — R600 Localized edema: Secondary | ICD-10-CM

## 2019-09-14 DIAGNOSIS — I2510 Atherosclerotic heart disease of native coronary artery without angina pectoris: Secondary | ICD-10-CM | POA: Diagnosis not present

## 2019-09-14 MED ORDER — FUROSEMIDE 20 MG PO TABS: 20.0000 mg | ORAL_TABLET | Freq: Every day | ORAL | 3 refills | Status: DC | PRN

## 2019-09-14 MED ORDER — ROSUVASTATIN CALCIUM 20 MG PO TABS: 20.0000 mg | ORAL_TABLET | Freq: Every day | ORAL | 3 refills | Status: DC

## 2019-09-14 NOTE — Patient Instructions (Signed)
Medication Instructions:  Start Lasix 20 mg daily as needed for swelling. Stop Lipitor. Start Crestor 20 mg daily. *If you need a refill on your cardiac medications before your next appointment, please call your pharmacy*   Follow-Up: At [facility], you and your health needs are our priority.  As part of our continuing mission to provide you with exceptional heart care, we have created designated Provider Care Teams.  These Care Teams include your primary Cardiologist (physician) and Advanced Practice Providers (APPs -  Physician Assistants and Nurse Practitioners) who all work together to provide you with the care you need, when you need it.  Your next appointment:   6 months  The format for your next appointment:   In Person  Provider:   Eleonore Chiquito, MD

## 2019-09-30 ENCOUNTER — Other Ambulatory Visit: Payer: Self-pay | Admitting: Family Medicine

## 2019-09-30 DIAGNOSIS — M810 Age-related osteoporosis without current pathological fracture: Secondary | ICD-10-CM

## 2019-10-14 ENCOUNTER — Other Ambulatory Visit: Payer: Self-pay

## 2019-10-14 ENCOUNTER — Ambulatory Visit
Admission: RE | Admit: 2019-10-14 | Discharge: 2019-10-14 | Disposition: A | Discharge status: Home / Self Care | Payer: Medicare Other | Source: Ambulatory Visit | Attending: Obstetrics & Gynecology | Admitting: Obstetrics & Gynecology

## 2019-10-14 DIAGNOSIS — Z1231 Encounter for screening mammogram for malignant neoplasm of breast: Secondary | ICD-10-CM

## 2019-11-02 DIAGNOSIS — M2242 Chondromalacia patellae, left knee: Secondary | ICD-10-CM | POA: Diagnosis not present

## 2019-11-02 DIAGNOSIS — M25562 Pain in left knee: Secondary | ICD-10-CM | POA: Diagnosis not present

## 2019-11-09 DIAGNOSIS — F319 Bipolar disorder, unspecified: Secondary | ICD-10-CM | POA: Diagnosis not present

## 2019-11-09 DIAGNOSIS — F411 Generalized anxiety disorder: Secondary | ICD-10-CM | POA: Diagnosis not present

## 2019-11-11 DIAGNOSIS — F332 Major depressive disorder, recurrent severe without psychotic features: Secondary | ICD-10-CM | POA: Diagnosis not present

## 2019-11-11 DIAGNOSIS — F411 Generalized anxiety disorder: Secondary | ICD-10-CM | POA: Diagnosis not present

## 2019-11-21 ENCOUNTER — Ambulatory Visit: Payer: Medicare Other

## 2019-11-21 ENCOUNTER — Ambulatory Visit: Payer: Medicare Other | Attending: Internal Medicine

## 2019-11-21 DIAGNOSIS — Z23 Encounter for immunization: Secondary | ICD-10-CM

## 2019-11-21 NOTE — Progress Notes (Signed)
   Covid-19 Vaccination Clinic  Name:  Lori Day    MRN: [id number] DOB: August 16, 1946  11/21/2019  [name] was observed post Covid-19 immunization for 15 minutes without incidence. She was provided with Vaccine Information Sheet and instruction to access the V-Safe system.   [name] was instructed to call 911 with any severe reactions post vaccine: Marland Kitchen Difficulty breathing  . Swelling of your face and throat  . A fast heartbeat  . A bad rash all over your body  . Dizziness and weakness    Immunizations Administered    Name Date Dose VIS Date Route   Pfizer COVID-19 Vaccine 11/21/2019 10:42 AM 0.3 mL 10/08/2019 Intramuscular   Manufacturer: [place]   Lot: [id number]   [place]: [id number]

## 2019-11-24 DIAGNOSIS — M25562 Pain in left knee: Secondary | ICD-10-CM | POA: Diagnosis not present

## 2019-11-26 DIAGNOSIS — F411 Generalized anxiety disorder: Secondary | ICD-10-CM | POA: Diagnosis not present

## 2019-11-26 DIAGNOSIS — F332 Major depressive disorder, recurrent severe without psychotic features: Secondary | ICD-10-CM | POA: Diagnosis not present

## 2019-12-08 DIAGNOSIS — L659 Nonscarring hair loss, unspecified: Secondary | ICD-10-CM | POA: Diagnosis not present

## 2019-12-13 ENCOUNTER — Ambulatory Visit: Payer: Medicare Other | Attending: Internal Medicine

## 2019-12-13 DIAGNOSIS — Z23 Encounter for immunization: Secondary | ICD-10-CM

## 2019-12-13 NOTE — Progress Notes (Signed)
   Covid-19 Vaccination Clinic  Name:  Shelbie Franken    MRN: [id number] DOB: 16-Oct-1946  12/13/2019  Ms. Spillane was observed post Covid-19 immunization for 15 minutes without incidence. She was provided with Vaccine Information Sheet and instruction to access the V-Safe system.   Ms. Kisner was instructed to call 911 with any severe reactions post vaccine: Marland Kitchen Difficulty breathing  . Swelling of your face and throat  . A fast heartbeat  . A bad rash all over your body  . Dizziness and weakness    Immunizations Administered    Name Date Dose VIS Date Route   Pfizer COVID-19 Vaccine 12/13/2019  9:29 AM 0.3 mL 10/08/2019 Intramuscular   Manufacturer: [place]   Lot: [id number]   [place]: [id number]

## 2019-12-15 DIAGNOSIS — M2242 Chondromalacia patellae, left knee: Secondary | ICD-10-CM | POA: Diagnosis not present

## 2019-12-15 DIAGNOSIS — M25562 Pain in left knee: Secondary | ICD-10-CM | POA: Diagnosis not present

## 2019-12-17 DIAGNOSIS — F411 Generalized anxiety disorder: Secondary | ICD-10-CM | POA: Diagnosis not present

## 2019-12-17 DIAGNOSIS — F319 Bipolar disorder, unspecified: Secondary | ICD-10-CM | POA: Diagnosis not present

## 2019-12-29 ENCOUNTER — Other Ambulatory Visit: Payer: Self-pay

## 2019-12-29 ENCOUNTER — Ambulatory Visit
Admission: RE | Admit: 2019-12-29 | Discharge: 2019-12-29 | Disposition: A | Discharge status: Home / Self Care | Payer: Medicare Other | Source: Ambulatory Visit | Attending: Family Medicine | Admitting: Family Medicine

## 2019-12-29 DIAGNOSIS — M85852 Other specified disorders of bone density and structure, left thigh: Secondary | ICD-10-CM | POA: Diagnosis not present

## 2019-12-29 DIAGNOSIS — Z780 Asymptomatic menopausal state: Secondary | ICD-10-CM | POA: Diagnosis not present

## 2019-12-29 DIAGNOSIS — M810 Age-related osteoporosis without current pathological fracture: Secondary | ICD-10-CM

## 2020-01-13 DIAGNOSIS — F411 Generalized anxiety disorder: Secondary | ICD-10-CM | POA: Diagnosis not present

## 2020-01-13 DIAGNOSIS — F319 Bipolar disorder, unspecified: Secondary | ICD-10-CM | POA: Diagnosis not present

## 2020-01-21 ENCOUNTER — Telehealth: Payer: Self-pay | Admitting: Pulmonary Disease

## 2020-01-21 MED ORDER — STIOLTO RESPIMAT 2.5-2.5 MCG/ACT IN AERS: 2.0000 | INHALATION_SPRAY | Freq: Every day | RESPIRATORY_TRACT | 0 refills | Status: DC

## 2020-01-21 NOTE — Telephone Encounter (Signed)
Pt requesting stiolto refill.  This has been sent to pharmacy as requested.  Pt overdue for appt. Scheduled appt with BI next available.  Nothing further needed at this time- will close encounter.

## 2020-02-07 DIAGNOSIS — F332 Major depressive disorder, recurrent severe without psychotic features: Secondary | ICD-10-CM | POA: Diagnosis not present

## 2020-02-07 DIAGNOSIS — F411 Generalized anxiety disorder: Secondary | ICD-10-CM | POA: Diagnosis not present

## 2020-02-09 DIAGNOSIS — B0089 Other herpesviral infection: Secondary | ICD-10-CM | POA: Diagnosis not present

## 2020-02-09 DIAGNOSIS — L0101 Non-bullous impetigo: Secondary | ICD-10-CM | POA: Diagnosis not present

## 2020-02-09 DIAGNOSIS — B078 Other viral warts: Secondary | ICD-10-CM | POA: Diagnosis not present

## 2020-02-09 DIAGNOSIS — L918 Other hypertrophic disorders of the skin: Secondary | ICD-10-CM | POA: Diagnosis not present

## 2020-02-09 DIAGNOSIS — L658 Other specified nonscarring hair loss: Secondary | ICD-10-CM | POA: Diagnosis not present

## 2020-02-09 DIAGNOSIS — D225 Melanocytic nevi of trunk: Secondary | ICD-10-CM | POA: Diagnosis not present

## 2020-02-09 DIAGNOSIS — L821 Other seborrheic keratosis: Secondary | ICD-10-CM | POA: Diagnosis not present

## 2020-02-09 DIAGNOSIS — L718 Other rosacea: Secondary | ICD-10-CM | POA: Diagnosis not present

## 2020-02-09 DIAGNOSIS — L905 Scar conditions and fibrosis of skin: Secondary | ICD-10-CM | POA: Diagnosis not present

## 2020-02-09 DIAGNOSIS — L218 Other seborrheic dermatitis: Secondary | ICD-10-CM | POA: Diagnosis not present

## 2020-02-09 DIAGNOSIS — L814 Other melanin hyperpigmentation: Secondary | ICD-10-CM | POA: Diagnosis not present

## 2020-02-09 DIAGNOSIS — Z85828 Personal history of other malignant neoplasm of skin: Secondary | ICD-10-CM | POA: Diagnosis not present

## 2020-02-11 DIAGNOSIS — F319 Bipolar disorder, unspecified: Secondary | ICD-10-CM | POA: Diagnosis not present

## 2020-02-11 DIAGNOSIS — F411 Generalized anxiety disorder: Secondary | ICD-10-CM | POA: Diagnosis not present

## 2020-02-16 ENCOUNTER — Other Ambulatory Visit: Payer: Self-pay

## 2020-02-16 ENCOUNTER — Ambulatory Visit (INDEPENDENT_AMBULATORY_CARE_PROVIDER_SITE_OTHER): Payer: Medicare Other | Admitting: Pulmonary Disease

## 2020-02-16 ENCOUNTER — Encounter: Payer: Self-pay | Admitting: Pulmonary Disease

## 2020-02-16 VITALS — BP 116/70 | HR 78 | Ht 68.5 in | Wt 208.8 lb

## 2020-02-16 DIAGNOSIS — Z720 Tobacco use: Secondary | ICD-10-CM | POA: Diagnosis not present

## 2020-02-16 DIAGNOSIS — F17210 Nicotine dependence, cigarettes, uncomplicated: Secondary | ICD-10-CM

## 2020-02-16 DIAGNOSIS — J449 Chronic obstructive pulmonary disease, unspecified: Secondary | ICD-10-CM

## 2020-02-16 DIAGNOSIS — Z122 Encounter for screening for malignant neoplasm of respiratory organs: Secondary | ICD-10-CM

## 2020-02-16 DIAGNOSIS — F17200 Nicotine dependence, unspecified, uncomplicated: Secondary | ICD-10-CM

## 2020-02-16 DIAGNOSIS — Z87891 Personal history of nicotine dependence: Secondary | ICD-10-CM

## 2020-02-16 MED ORDER — STIOLTO RESPIMAT 2.5-2.5 MCG/ACT IN AERS: 2.0000 | INHALATION_SPRAY | Freq: Every day | RESPIRATORY_TRACT | 11 refills | Status: AC

## 2020-02-16 NOTE — Patient Instructions (Addendum)
Thank you for visiting Dr. Valeta Harms at [facility]. Today we recommend the following:  Continue current stiolto and as needed albuterol   Return in about 1 year (around 02/15/2021), or if symptoms worsen or fail to improve, for with APP or Dr. Valeta Harms.    Please do your part to reduce the spread of COVID-19.

## 2020-02-16 NOTE — Progress Notes (Signed)
Synopsis: Referred in Feb 2020 for follow, patient of Dr. Lenna Gilford. PCP: Marda Stalker, PA-C  Subjective:   PATIENT ID: Lori Day GENDER: female DOB: 1946-03-19, MRN: [id number]  Chief Complaint  Patient presents with  . Follow-up    Pt states she has been doing fine since last visit and denies any complaints.    PMH arthritis, bipolar disease, GERD, tongue cancer, currently on chantix, trying to quit smoking. She has been on chantix in the past off and on. Tongue cancer survivor 5 years. She was treated with prednisone for back pains. Concerned today about weight gain. She has follow up with psych as well as counselor in the coming weeks.  When asked the patient about her psychiatric history and her current ongoing symptoms.  She does admits to occasional hallucinations and seeing shadows within the room of what she thinks is people on occasion.  She also hears voices sometimes.  She says that she is used to the voices and none of the voices tell her to harm anyone or anything.  She said sometimes it sounds like it is static within her brain.  I encouraged her to follow-up with her psychiatrist regarding these ongoing symptoms.  Overall she had pulmonary function test completed in the past which were demonstrated mild COPD.  And she is well controlled on her current Stiolto regimen.  Otherwise her respiratory complaints are at baseline and she has had no additional symptoms.  Has never been enrolled in lung cancer screening program.  And she has been seen in pulmonary rehab in the past which she really enjoyed.  She does understand that she would like to get out this spring time and exercise more.  She spends most of her time sitting at home on the couch watching television or researching genealogy.  OV 02/16/20: doing well with her stiolto. She has albuterol but she never uses it. She has not been exercising too much. She has a new stationary bike. She stopped smoking. She did  wonderful using chantix and welbutrin.  Overall, the patient's only complaints are that she does have some dyspnea on exertion with climbing stairs.  Overall has been doing much better especially after quitting smoking.  She recently started college classes at [employer] after applying for a [employer].  She is taking [place] history, [place] and psychology.  She is doing this just to help keep her busy and her mind active.  She is overall really enjoying those in her retirement.   Past Medical History:  Diagnosis Date  . Arthritis   . Bipolar 1 disorder ([place])   . Cancer of tongue ([place]) 09/19/2014   HPV infection, and ex smoker. She quit in 2013.   Marland Kitchen GERD (gastroesophageal reflux disease)   . Glaucoma   . Hiatal hernia   . History of HPV infection   . Hx of chronic sinusitis   . 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 Sciatica   . Sensory disturbance 09/19/2014  . Sensory hearing loss, bilateral   . Sjogren's syndrome ([place]) 09-02-2007  . 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 Thyroid nodule   . Tongue cancer ([place]) 02/02/2013   HPV/ radiation/chemo     Family History  Problem Relation Age of Onset  . COPD Father   . Thyroid cancer Father 28  . Pancreatic cancer Mother 58       no EtOH, not a smoker  . Ovarian cancer Sister  52       ovarian cancer survivor; BRIP1+  . Pancreatic cancer Maternal Grandmother   . Heart attack Maternal Grandfather 77  . Breast cancer Paternal Grandmother 37  . COPD Paternal Grandfather   . Emphysema Paternal Grandfather   . Prostate cancer Maternal Uncle   . Prostate cancer Maternal Uncle   . Breast cancer Cousin        maternal 1st cousin dx. early 62s; negative for familial BRIP1 mutation  . Asthma Other      Past Surgical History:  Procedure Laterality Date  . ABDOMINAL HYSTERECTOMY    . APPENDECTOMY  1959  . BREAST BIOPSY     three times, twice on Left, once on Right, diagnosis as calcifications 1994-1999  .  CATARACT EXTRACTION Bilateral 05-29-10, 06-14-10  . CHOLECYSTECTOMY    . KNEE SURGERY  08-15-11   arthoscopy  . tongue cancer    . TONSILECTOMY, ADENOIDECTOMY, BILATERAL MYRINGOTOMY AND TUBES  1955  . TUBAL LIGATION  1981  . turbinate surgery     Dr. Nicole Cella in [place].    Social History   Socioeconomic History  . Marital status: Divorced    Spouse name: Not on file  . Number of children: 2  . Years of education: 31  . Highest education level: Not on file  Occupational History  . Occupation: retired    Comment: criminal justice  Tobacco Use  . Smoking status: Former Smoker    Packs/day: 1.00    Years: 50.00    Pack years: 50.00    Types: Cigarettes    Quit date: 07/01/2018    Years since quitting: 1.6  . Smokeless tobacco: Never Used  . Tobacco comment: quit 07/01/18  Substance and Sexual Activity  . Alcohol use: Yes    Comment: craft beer 2 times week  . Drug use: No  . Sexual activity: Not on file  Other Topics Concern  . Not on file  Social History Narrative   Caffeine 2 cups daily, Single, 2 kids, retired.   Social Determinants of Health   Financial Resource Strain:   . Difficulty of Paying Living Expenses:   Food Insecurity:   . Worried About Charity fundraiser in the Last Year:   . Arboriculturist in the Last Year:   Transportation Needs:   . Film/video editor (Medical):   Marland Kitchen Lack of Transportation (Non-Medical):   Physical Activity:   . Days of Exercise per Week:   . Minutes of Exercise per Session:   Stress:   . Feeling of Stress :   Social Connections:   . Frequency of Communication with Friends and Family:   . Frequency of Social Gatherings with Friends and Family:   . Attends Religious Services:   . Active Member of Clubs or Organizations:   . Attends Archivist Meetings:   Marland Kitchen Marital Status:   Intimate Partner Violence:   . Fear of Current or Ex-Partner:   . Emotionally Abused:   Marland Kitchen Physically Abused:   . Sexually Abused:       Allergies  Allergen Reactions  . Aspirin Swelling  . Compazine [Prochlorperazine Edisylate]     Slurred speech   . Pseudophed-Chlophedianol-Gg     Side effects  . Seroquel [Quetiapine Fumarate] Swelling  . Latex Rash  . Tape Rash    adhesive     Outpatient Medications Prior to Visit  Medication Sig Dispense Refill  . Albuterol Sulfate (PROAIR RESPICLICK) 115 (90 Base)  MCG/ACT AEPB Inhale 2 puffs into the lungs every 6 (six) hours as needed. 1 each 0  . b complex vitamins tablet Bid po OTC (Patient taking differently: 1 tablet daily. OTC)    . buPROPion (WELLBUTRIN XL) 150 MG 24 hr tablet TAKE 1 TABLET BY MOUTH EVERY MORNING FOR DEPRESSION    . Calcium Carb-Cholecalciferol (CALCIUM PLUS VITAMIN D3) 600-800 MG-UNIT TABS     . cetirizine (ZYRTEC) 10 MG tablet Take 10 mg by mouth daily as needed for allergies.    . Cholecalciferol (VITAMIN D3) 125 MCG (5000 UT) CAPS Take 5,000 Units by mouth daily.    . clobetasol (TEMOVATE) 0.05 % external solution APPLY TWICE DAILY TO SCALP AS NEEDED FOR ITCH    . Fluocinolone Acetonide Scalp 0.01 % OIL APPLY TO DAMP SCALP, LATHER LET SIT OVERNIGHT AND WASH OUT AM    . FLUoxetine (PROZAC) 20 MG capsule Take 20 mg by mouth.    Marland Kitchen GLUCOSA-CHONDR-NA CHONDR-MSM PO Take by mouth daily. W/ vit d    . ketoconazole (NIZORAL) 2 % shampoo WASH SCALP 1 2 X WEEK    . levothyroxine (SYNTHROID, LEVOTHROID) 75 MCG tablet Take 1 tablet (75 mcg total) by mouth daily before breakfast. 30 tablet 6  . mupirocin ointment (BACTROBAN) 2 % APPLY TO NOSTRILS 3 TIMES DAILY    . omeprazole (PRILOSEC) 20 MG capsule Take 20 mg by mouth every other day.     . OXYGEN Inhale 2 L into the lungs at bedtime.    Vladimir Faster Glycol-Propyl Glycol (SYSTANE OP) Apply to eye as needed.    . rosuvastatin (CRESTOR) 20 MG tablet Take 20 mg by mouth daily.    . Tiotropium Bromide-Olodaterol (STIOLTO RESPIMAT) 2.5-2.5 MCG/ACT AERS Inhale 2 puffs into the lungs daily. 12 g 0  . TURMERIC PO Take  by mouth.    . valACYclovir (VALTREX) 1000 MG tablet Take 1,000 mg by mouth 2 (two) times daily. PRN    . furosemide (LASIX) 20 MG tablet Take 1 tablet (20 mg total) by mouth daily as needed for edema. 90 tablet 3  . furosemide (LASIX) 20 MG tablet Take 20 mg by mouth daily as needed for edema.    . rosuvastatin (CRESTOR) 20 MG tablet Take 1 tablet (20 mg total) by mouth daily. 90 tablet 3  . Zoledronic Acid (RECLAST IV) Inject into the vein. 1 time per year     No facility-administered medications prior to visit.    Review of Systems  Constitutional: Negative for chills, fever, malaise/fatigue and weight loss.  HENT: Negative for hearing loss, sore throat and tinnitus.   Eyes: Negative for blurred vision and double vision.  Respiratory: Negative for cough, hemoptysis, sputum production, shortness of breath, wheezing and stridor.   Cardiovascular: Negative for chest pain, palpitations, orthopnea, leg swelling and PND.  Gastrointestinal: Negative for abdominal pain, constipation, diarrhea, heartburn, nausea and vomiting.  Genitourinary: Negative for dysuria, hematuria and urgency.  Musculoskeletal: Negative for joint pain and myalgias.  Skin: Negative for itching and rash.  Neurological: Negative for dizziness, tingling, weakness and headaches.  Endo/Heme/Allergies: Negative for environmental allergies. Does not bruise/bleed easily.  Psychiatric/Behavioral: Negative for depression. The patient is not nervous/anxious and does not have insomnia.   All other systems reviewed and are negative.    Objective:  Physical Exam Vitals reviewed.  Constitutional:      General: She is not in acute distress.    Appearance: She is well-developed.  HENT:     Head: Normocephalic  and atraumatic.  Eyes:     General: No scleral icterus.    Conjunctiva/sclera: Conjunctivae normal.     Pupils: Pupils are equal, round, and reactive to light.  Neck:     Vascular: No JVD.     Trachea: No tracheal  deviation.  Cardiovascular:     Rate and Rhythm: Normal rate and regular rhythm.     Heart sounds: Normal heart sounds. No murmur.  Pulmonary:     Effort: Pulmonary effort is normal. No tachypnea, accessory muscle usage or respiratory distress.     Breath sounds: No stridor. No wheezing, rhonchi or rales.     Comments: Diminished breath sounds bilaterally Abdominal:     General: Bowel sounds are normal. There is no distension.     Palpations: Abdomen is soft.     Tenderness: There is no abdominal tenderness.  Musculoskeletal:        General: No tenderness.     Cervical back: Neck supple.  Lymphadenopathy:     Cervical: No cervical adenopathy.  Skin:    General: Skin is warm and dry.     Capillary Refill: Capillary refill takes less than 2 seconds.     Findings: No rash.  Neurological:     Mental Status: She is alert and oriented to person, place, and time.  Psychiatric:        Behavior: Behavior normal.      Vitals:   02/16/20 1351  BP: 116/70  Pulse: 78  SpO2: 96%  Weight: 208 lb 12.8 oz (94.7 kg)  Height: 5' 8.5" (1.74 m)   96% on RA BMI Readings from Last 3 Encounters:  02/16/20 31.29 kg/m  09/14/19 31.92 kg/m  06/21/19 31.29 kg/m   Wt Readings from Last 3 Encounters:  02/16/20 208 lb 12.8 oz (94.7 kg)  09/14/19 213 lb (96.6 kg)  06/21/19 208 lb 12.8 oz (94.7 kg)     CBC No results found for: WBC, RBC, HGB, HCT, PLT, MCV, MCH, MCHC, RDW, LYMPHSABS, MONOABS, EOSABS, BASOSABS  Chest Imaging: Patient has not had any prior axial CT imaging of the chest  Chest x-ray 06/26/2017: Evidence of COPD.  No infiltrate The patient's images have been independently reviewed by me.    Pulmonary Functions Testing Results: PFT Results Latest Ref Rng & Units 03/16/2015  FVC-Pre L 3.26  FVC-Predicted Pre % 89  FVC-Post L 3.57  FVC-Predicted Post % 98  Pre FEV1/FVC % % 61  Post FEV1/FCV % % 64  FEV1-Pre L 1.98  FEV1-Predicted Pre % 71  FEV1-Post L 2.30  DLCO UNC%  % 59  DLCO COR %Predicted % 60  TLC L 5.95  TLC % Predicted % 103  RV % Predicted % 107     FeNO: None   Pathology: None  Echocardiogram: None   Heart Catheterization: None     Assessment & Plan:   Stage 1 mild COPD by GOLD classification (HCC)  Encounter for screening for malignant neoplasm of respiratory organs  Moderate smoker (20 or less per day)  Personal history of tobacco use, presenting hazards to health  Tobacco abuse  Discussion:  This is a 74 year old female with a history of tobacco abuse.  She started Chantix last year and was successfully able to control her urges for cigarettes and was able to quit this past year.  She is doing so much better from a respiratory standpoint after quitting smoking.  She is very excited about this.  I congratulated her on her wonderful news.  At this time plan: Continued enrollment in our lung cancer screening program.  Next CT imaging to be completed in June 2021. Refills today of her Stiolto Continue use of albuterol as needed. Continue vitamin D supplementation.  Greater than 50% of this patient's 21-minute of visit was spent face-to-face discussing above recommendations and treatment plan.  We also reviewed patient's lung cancer screening results from May of last year as she had questions about it.   Current Outpatient Medications:  .  Albuterol Sulfate (PROAIR RESPICLICK) 778 (90 Base) MCG/ACT AEPB, Inhale 2 puffs into the lungs every 6 (six) hours as needed., Disp: 1 each, Rfl: 0 .  b complex vitamins tablet, Bid po OTC (Patient taking differently: 1 tablet daily. OTC), Disp: , Rfl:  .  buPROPion (WELLBUTRIN XL) 150 MG 24 hr tablet, TAKE 1 TABLET BY MOUTH EVERY MORNING FOR DEPRESSION, Disp: , Rfl:  .  Calcium Carb-Cholecalciferol (CALCIUM PLUS VITAMIN D3) 600-800 MG-UNIT TABS, , Disp: , Rfl:  .  cetirizine (ZYRTEC) 10 MG tablet, Take 10 mg by mouth daily as needed for allergies., Disp: , Rfl:  .  Cholecalciferol (VITAMIN  D3) 125 MCG (5000 UT) CAPS, Take 5,000 Units by mouth daily., Disp: , Rfl:  .  clobetasol (TEMOVATE) 0.05 % external solution, APPLY TWICE DAILY TO SCALP AS NEEDED FOR ITCH, Disp: , Rfl:  .  Fluocinolone Acetonide Scalp 0.01 % OIL, APPLY TO DAMP SCALP, LATHER LET SIT OVERNIGHT AND [place] OUT AM, Disp: , Rfl:  .  FLUoxetine (PROZAC) 20 MG capsule, Take 20 mg by mouth., Disp: , Rfl:  .  GLUCOSA-CHONDR-NA CHONDR-MSM PO, Take by mouth daily. W/ vit d, Disp: , Rfl:  .  ketoconazole (NIZORAL) 2 % shampoo, WASH SCALP 1 2 X WEEK, Disp: , Rfl:  .  levothyroxine (SYNTHROID, LEVOTHROID) 75 MCG tablet, Take 1 tablet (75 mcg total) by mouth daily before breakfast., Disp: 30 tablet, Rfl: 6 .  mupirocin ointment (BACTROBAN) 2 %, APPLY TO NOSTRILS 3 TIMES DAILY, Disp: , Rfl:  .  omeprazole (PRILOSEC) 20 MG capsule, Take 20 mg by mouth every other day. , Disp: , Rfl:  .  OXYGEN, Inhale 2 L into the lungs at bedtime., Disp: , Rfl:  .  Polyethyl Glycol-Propyl Glycol (SYSTANE OP), Apply to eye as needed., Disp: , Rfl:  .  rosuvastatin (CRESTOR) 20 MG tablet, Take 20 mg by mouth daily., Disp: , Rfl:  .  Tiotropium Bromide-Olodaterol (STIOLTO RESPIMAT) 2.5-2.5 MCG/ACT AERS, Inhale 2 puffs into the lungs daily., Disp: 12 g, Rfl: 0 .  TURMERIC PO, Take by mouth., Disp: , Rfl:  .  valACYclovir (VALTREX) 1000 MG tablet, Take 1,000 mg by mouth 2 (two) times daily. PRN, Disp: , Rfl:  .  furosemide (LASIX) 20 MG tablet, Take 1 tablet (20 mg total) by mouth daily as needed for edema., Disp: 90 tablet, Rfl: 3 .  furosemide (LASIX) 20 MG tablet, Take 20 mg by mouth daily as needed for edema., Disp: , Rfl:  .  rosuvastatin (CRESTOR) 20 MG tablet, Take 1 tablet (20 mg total) by mouth daily., Disp: 90 tablet, Rfl: 3 .  Zoledronic Acid (RECLAST IV), Inject into the vein. 1 time per year, Disp: , Rfl:    Garner Nash, DO [facility] 02/16/2020 2:04 PM

## 2020-03-07 DIAGNOSIS — K219 Gastro-esophageal reflux disease without esophagitis: Secondary | ICD-10-CM | POA: Diagnosis not present

## 2020-03-07 DIAGNOSIS — Z85819 Personal history of malignant neoplasm of unspecified site of lip, oral cavity, and pharynx: Secondary | ICD-10-CM | POA: Diagnosis not present

## 2020-03-07 DIAGNOSIS — Z923 Personal history of irradiation: Secondary | ICD-10-CM | POA: Diagnosis not present

## 2020-03-10 DIAGNOSIS — F411 Generalized anxiety disorder: Secondary | ICD-10-CM | POA: Diagnosis not present

## 2020-03-10 DIAGNOSIS — F319 Bipolar disorder, unspecified: Secondary | ICD-10-CM | POA: Diagnosis not present

## 2020-03-12 NOTE — Progress Notes (Signed)
Cardiology Office Note:   Date:  03/13/2020  NAME:  Lori Day    MRN: [id number] DOB:  07-05-1946   PCP:  Marda Stalker, PA-C  Cardiologist:  Evalina Field, MD   Referring MD: Marda Stalker, PA-C   Chief Complaint  Patient presents with  . Coronary Artery Disease    History of Present Illness:   Lori Day is a 74 y.o. female with a hx of CAD, HLD, COPD who presents for follow-up. Coronary calcium seen in prior lung CA screening. No symptoms. Echo normal.   She reports he is doing well.  She is tolerating Crestor 20 mg daily without any issues.  Her most recent LDL cholesterol was 79.  We do need to recheck this.  She is not fasting today.  She denies any chest pain or shortness of breath.  She reports she can walk as far she likes without any major limitations.  She does not exercise routinely but is going to start doing this.  She does report some cramping and pain in her left leg.  She has been told it was arthritis and has been getting leg injections.  She does report that she gets pain in her left lower extremity that goes up into the left hip region.  She reports it is sharp pain she does not describe any claudication or pain such as exertional calf pain.  She does report she is concerned this possibly is a vascular issue.  She does have diminished pulses bilaterally.  She did quit smoking but did smoke for 55+ years.  Her symptoms possibly are PAD related.  She denies any chest pain shortness of breath palpitations today.  Problem List 1. COPD 2. Tongue CA 3. CAD -calcium seen on CT lung cancer screening -no chest pain 4. HLD -T chol 144, HDL 45, LDL 79, TG 111 5. Tobacco abuse -55 years but quit   Past Medical History: Past Medical History:  Diagnosis Date  . Arthritis   . Bipolar 1 disorder ([place])   . Cancer of tongue ([place]) 09/19/2014   HPV infection, and ex smoker. She quit in 2013.   Marland Kitchen GERD (gastroesophageal reflux disease)   .  Glaucoma   . Hiatal hernia   . History of HPV infection   . Hx of chronic sinusitis   . 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 Sciatica   . Sensory disturbance 09/19/2014  . Sensory hearing loss, bilateral   . Sjogren's syndrome ([place]) 09-02-2007  . 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 Thyroid nodule   . Tongue cancer ([place]) 02/02/2013   HPV/ radiation/chemo    Past Surgical History: Past Surgical History:  Procedure Laterality Date  . ABDOMINAL HYSTERECTOMY    . APPENDECTOMY  1959  . BREAST BIOPSY     three times, twice on Left, once on Right, diagnosis as calcifications 1994-1999  . CATARACT EXTRACTION Bilateral 05-29-10, 06-14-10  . CHOLECYSTECTOMY    . KNEE SURGERY  08-15-11   arthoscopy  . tongue cancer    . TONSILECTOMY, ADENOIDECTOMY, BILATERAL MYRINGOTOMY AND TUBES  1955  . TUBAL LIGATION  1981  . turbinate surgery     Dr. Nicole Cella in [place].    Current Medications: Current Meds  Medication Sig  . Albuterol Sulfate (PROAIR RESPICLICK) 732 (90 Base) MCG/ACT AEPB Inhale 2 puffs into the lungs every 6 (six) hours as needed.  Marland Kitchen b complex vitamins tablet Bid po OTC (Patient  taking differently: 1 tablet daily. OTC)  . buPROPion (WELLBUTRIN XL) 150 MG 24 hr tablet TAKE 1 TABLET BY MOUTH EVERY MORNING FOR DEPRESSION  . Calcium Carb-Cholecalciferol (CALCIUM PLUS VITAMIN D3) 600-800 MG-UNIT TABS   . cetirizine (ZYRTEC) 10 MG tablet Take 10 mg by mouth daily as needed for allergies.  . Cholecalciferol (VITAMIN D3) 125 MCG (5000 UT) CAPS Take 5,000 Units by mouth daily.  . clobetasol (TEMOVATE) 0.05 % external solution APPLY TWICE DAILY TO SCALP AS NEEDED FOR ITCH  . Fluocinolone Acetonide Scalp 0.01 % OIL APPLY TO DAMP SCALP, LATHER LET SIT OVERNIGHT AND WASH OUT AM  . FLUoxetine (PROZAC) 20 MG capsule Take 20 mg by mouth.  Marland Kitchen GLUCOSA-CHONDR-NA CHONDR-MSM PO Take by mouth daily. W/ vit d  . ketoconazole (NIZORAL) 2 % shampoo WASH SCALP 1 2 X  WEEK  . levothyroxine (SYNTHROID, LEVOTHROID) 75 MCG tablet Take 1 tablet (75 mcg total) by mouth daily before breakfast.  . omeprazole (PRILOSEC) 20 MG capsule Take 20 mg by mouth every other day.   . OXYGEN Inhale 2 L into the lungs at bedtime.  Vladimir Faster Glycol-Propyl Glycol (SYSTANE OP) Apply to eye as needed.  . rosuvastatin (CRESTOR) 20 MG tablet Take 20 mg by mouth daily.  . Tiotropium Bromide-Olodaterol (STIOLTO RESPIMAT) 2.5-2.5 MCG/ACT AERS Inhale 2 puffs into the lungs daily.  . TURMERIC PO Take by mouth.  . valACYclovir (VALTREX) 1000 MG tablet Take 1,000 mg by mouth 2 (two) times daily. PRN  . Zoledronic Acid (RECLAST IV) Inject into the vein. 1 time per year  . [DISCONTINUED] furosemide (LASIX) 20 MG tablet Take 20 mg by mouth daily as needed for edema.  . [DISCONTINUED] mupirocin ointment (BACTROBAN) 2 % APPLY TO NOSTRILS 3 TIMES DAILY     Allergies:    Aspirin, Compazine [prochlorperazine edisylate], Pseudophed-chlophedianol-gg, Seroquel [quetiapine fumarate], Latex, and Tape   Social History: Social History   Socioeconomic History  . Marital status: Divorced    Spouse name: Not on file  . Number of children: 2  . Years of education: 36  . Highest education level: Not on file  Occupational History  . Occupation: retired    Comment: criminal justice  Tobacco Use  . Smoking status: Former Smoker    Packs/day: 1.00    Years: 50.00    Pack years: 50.00    Types: Cigarettes    Quit date: 07/01/2018    Years since quitting: 1.7  . Smokeless tobacco: Never Used  . Tobacco comment: quit 07/01/18  Substance and Sexual Activity  . Alcohol use: Yes    Comment: craft beer 2 times week  . Drug use: No  . Sexual activity: Not on file  Other Topics Concern  . Not on file  Social History Narrative   Caffeine 2 cups daily, Single, 2 kids, retired.   Social Determinants of Health   Financial Resource Strain:   . Difficulty of Paying Living Expenses:   Food Insecurity:    . Worried About Charity fundraiser in the Last Year:   . Arboriculturist in the Last Year:   Transportation Needs:   . Film/video editor (Medical):   Marland Kitchen Lack of Transportation (Non-Medical):   Physical Activity:   . Days of Exercise per Week:   . Minutes of Exercise per Session:   Stress:   . Feeling of Stress :   Social Connections:   . Frequency of Communication with Friends and Family:   . Frequency  of Social Gatherings with Friends and Family:   . Attends Religious Services:   . Active Member of Clubs or Organizations:   . Attends Archivist Meetings:   Marland Kitchen Marital Status:      Family History: The patient's family history includes Asthma in an other family member; Breast cancer in her cousin; Breast cancer (age of onset: 22) in her paternal grandmother; COPD in her father and paternal grandfather; Emphysema in her paternal grandfather; Heart attack (age of onset: 29) in her maternal grandfather; Ovarian cancer (age of onset: 16) in her sister; Pancreatic cancer in her maternal grandmother; Pancreatic cancer (age of onset: 31) in her mother; Prostate cancer in her maternal uncle and maternal uncle; Thyroid cancer (age of onset: 33) in her father.  ROS:   All other ROS reviewed and negative. Pertinent positives noted in the HPI.     EKGs/Labs/Other Studies Reviewed:   The following studies were personally reviewed by me today:  EKG:  EKG is ordered today.  The ekg ordered today demonstrates normal sinus rhythm, heart rate 60, incomplete right bundle branch block, no acute ischemic changes her legs and everything your, and was personally reviewed by me.   Echo 06/22/2019 1. The left ventricle has normal systolic function with an ejection  fraction of 60-65%. The cavity size was normal. There is mild concentric  left ventricular hypertrophy. Left ventricular diastolic parameters were  normal.  2. The right ventricle has normal systolic function. The cavity was   normal. There is no increase in right ventricular wall thickness.  3. The interatrial septum appears to be lipomatous.   Recent Labs: No results found for requested labs within last 8760 hours.   Recent Lipid Panel    Component Value Date/Time   CHOL 144 09/10/2019 0908   TRIG 111 09/10/2019 0908   HDL 45 09/10/2019 0908   CHOLHDL 3.2 09/10/2019 0908   LDLCALC 79 09/10/2019 0908    Physical Exam:   VS:  BP 130/80   Pulse 60   Temp (!) 95.2 F (35.1 C)   Ht 5\' 8"  (1.727 m)   Wt 208 lb (94.3 kg)   SpO2 (!) 84%   BMI 31.63 kg/m    Wt Readings from Last 3 Encounters:  03/13/20 208 lb (94.3 kg)  02/16/20 208 lb 12.8 oz (94.7 kg)  09/14/19 213 lb (96.6 kg)    General: Well nourished, well developed, in no acute distress Heart: Atraumatic, normal size  Eyes: PEERLA, EOMI  Neck: Supple, no JVD Endocrine: No thryomegaly Cardiac: Normal S1, S2; RRR; no murmurs, rubs, or gallops Lungs: Clear to auscultation bilaterally, no wheezing, rhonchi or rales  Abd: Soft, nontender, no hepatomegaly  Ext: Absent DP pulse in left lower extremity, absent DP pulse in right lower extremity, PT pulses are diminished bilaterally Musculoskeletal: No deformities, BUE and BLE strength normal and equal Skin: Warm and dry, no rashes   Neuro: Alert and oriented to person, place, time, and situation, CNII-XII grossly intact, no focal deficits  Psych: Normal mood and affect   ASSESSMENT:   Janila Arrazola is a 74 y.o. female who presents for the following: 1. Coronary artery disease involving native coronary artery of native heart without angina pectoris   2. Mixed hyperlipidemia   3. Pain of left lower extremity     PLAN:   1. Coronary artery disease involving native coronary artery of native heart without angina pectoris -Calcium seen on a CT scan for lung cancer screening.  She has no symptoms of angina or chest pain.  She has no limitation with exercise other than some left leg pain  discussed below.  She was started on Crestor.  Most recent LDL cholesterol 79.  We do need to recheck a lipid profile since starting Crestor.  Goal is less than 70.  She will do this in the next week or so.  She is allergic to aspirin.  Okay to forego for now.  We will likely consider Plavix as she has significant PAD.  2. Mixed hyperlipidemia -Recheck lipid profile in the next week or so.  Continue Crestor 20 mg daily.  Refill today.  3. Pain of left lower extremity -Does describe some pain in her left lower extremity.  Not classic for claudication.  Diminished pulses bilaterally.  We will screen her for PAD with ABIs as well as arterial duplexes.   Disposition: Return in about 1 year (around 03/13/2021).  Medication Adjustments/Labs and Tests Ordered: Current medicines are reviewed at length with the patient today.  Concerns regarding medicines are outlined above.  Orders Placed This Encounter  Procedures  . Lipid panel  . EKG 12-Lead  . VAS [place] LOWER EXTREMITY ARTERIAL DUPLEX  . VAS [place] ABI WITH/WO TBI   No orders of the defined types were placed in this encounter.   Patient Instructions  Medication Instructions:  The current medical regimen is effective;  continue present plan and medications.  *If you need a refill on your cardiac medications before your next appointment, please call your pharmacy*   Lab Work: LIPID (come back fasting, no appointment needed at our office)  If you have labs (blood work) drawn today and your tests are completely normal, you will receive your results only by: Marland Kitchen MyChart Message (if you have MyChart) OR . A paper copy in the mail If you have any lab test that is abnormal or we need to change your treatment, we will call you to review the results.   Testing/Procedures: Your physician has requested that you have an ankle brachial index (ABI). During this test an ultrasound and blood pressure cuff are used to evaluate the arteries that supply the  arms and legs with blood. Allow thirty minutes for this exam. There are no restrictions or special instructions.  Your physician has requested that you have a lower extremity arterial duplex. This test is an ultrasound of the arteries in the legs. It looks at arterial blood flow in the legs. Allow one hour for Lower and Upper Arterial scans. There are no restrictions or special instructions    Follow-Up: At [facility], you and your health needs are our priority.  As part of our continuing mission to provide you with exceptional heart care, we have created designated Provider Care Teams.  These Care Teams include your primary Cardiologist (physician) and Advanced Practice Providers (APPs -  Physician Assistants and Nurse Practitioners) who all work together to provide you with the care you need, when you need it.  We recommend signing up for the patient portal called "MyChart".  Sign up information is provided on this After Visit Summary.  MyChart is used to connect with patients for Virtual Visits (Telemedicine).  Patients are able to view lab/test results, encounter notes, upcoming appointments, etc.  Non-urgent messages can be sent to your provider as well.   To learn more about what you can do with MyChart, go to [email or web address].    Your next appointment:   12 month(s)  The format for  your next appointment:   In Person  Provider:   Eleonore Chiquito, MD        Time Spent with Patient: I have spent a total of 35 minutes with patient reviewing hospital notes, telemetry, EKGs, labs and examining the patient as well as establishing an assessment and plan that was discussed with the patient.  > 50% of time was spent in direct patient care.  Signed, Addison Naegeli. Audie Box, [place]  [address] [place], [place] [address] [phone]  03/13/2020 11:44 AM

## 2020-03-13 ENCOUNTER — Other Ambulatory Visit: Payer: Self-pay

## 2020-03-13 ENCOUNTER — Encounter: Payer: Self-pay | Admitting: Cardiovascular Disease

## 2020-03-13 ENCOUNTER — Ambulatory Visit (INDEPENDENT_AMBULATORY_CARE_PROVIDER_SITE_OTHER): Payer: Medicare Other | Admitting: Cardiovascular Disease

## 2020-03-13 VITALS — BP 130/80 | HR 60 | Temp 95.2°F | Ht 68.0 in | Wt 208.0 lb

## 2020-03-13 DIAGNOSIS — M25562 Pain in left knee: Secondary | ICD-10-CM | POA: Diagnosis not present

## 2020-03-13 DIAGNOSIS — I2510 Atherosclerotic heart disease of native coronary artery without angina pectoris: Secondary | ICD-10-CM | POA: Diagnosis not present

## 2020-03-13 DIAGNOSIS — M79605 Pain in left leg: Secondary | ICD-10-CM | POA: Diagnosis not present

## 2020-03-13 DIAGNOSIS — E782 Mixed hyperlipidemia: Secondary | ICD-10-CM | POA: Diagnosis not present

## 2020-03-13 NOTE — Patient Instructions (Signed)
Medication Instructions:  The current medical regimen is effective;  continue present plan and medications.  *If you need a refill on your cardiac medications before your next appointment, please call your pharmacy*   Lab Work: LIPID (come back fasting, no appointment needed at our office)  If you have labs (blood work) drawn today and your tests are completely normal, you will receive your results only by: Marland Kitchen MyChart Message (if you have MyChart) OR . A paper copy in the mail If you have any lab test that is abnormal or we need to change your treatment, we will call you to review the results.   Testing/Procedures: Your physician has requested that you have an ankle brachial index (ABI). During this test an ultrasound and blood pressure cuff are used to evaluate the arteries that supply the arms and legs with blood. Allow thirty minutes for this exam. There are no restrictions or special instructions.  Your physician has requested that you have a lower extremity arterial duplex. This test is an ultrasound of the arteries in the legs. It looks at arterial blood flow in the legs. Allow one hour for Lower and Upper Arterial scans. There are no restrictions or special instructions    Follow-Up: At [facility], you and your health needs are our priority.  As part of our continuing mission to provide you with exceptional heart care, we have created designated Provider Care Teams.  These Care Teams include your primary Cardiologist (physician) and Advanced Practice Providers (APPs -  Physician Assistants and Nurse Practitioners) who all work together to provide you with the care you need, when you need it.  We recommend signing up for the patient portal called "MyChart".  Sign up information is provided on this After Visit Summary.  MyChart is used to connect with patients for Virtual Visits (Telemedicine).  Patients are able to view lab/test results, encounter notes, upcoming appointments, etc.   Non-urgent messages can be sent to your provider as well.   To learn more about what you can do with MyChart, go to [email or web address].    Your next appointment:   12 month(s)  The format for your next appointment:   In Person  Provider:   Eleonore Chiquito, MD

## 2020-03-22 DIAGNOSIS — L249 Irritant contact dermatitis, unspecified cause: Secondary | ICD-10-CM | POA: Diagnosis not present

## 2020-03-22 DIAGNOSIS — B353 Tinea pedis: Secondary | ICD-10-CM | POA: Diagnosis not present

## 2020-03-22 DIAGNOSIS — K130 Diseases of lips: Secondary | ICD-10-CM | POA: Diagnosis not present

## 2020-03-22 DIAGNOSIS — L218 Other seborrheic dermatitis: Secondary | ICD-10-CM | POA: Diagnosis not present

## 2020-03-23 DIAGNOSIS — M818 Other osteoporosis without current pathological fracture: Secondary | ICD-10-CM | POA: Diagnosis not present

## 2020-03-23 DIAGNOSIS — E782 Mixed hyperlipidemia: Secondary | ICD-10-CM | POA: Diagnosis not present

## 2020-03-24 LAB — LIPID PANEL
Chol/HDL Ratio: 2.4 ratio (ref 0.0–4.4)
Cholesterol, Total: 119 mg/dL (ref 100–199)
HDL: 50 mg/dL (ref >39)
LDL Chol Calc (NIH): 54 mg/dL (ref 0–99)
Triglycerides: 75 mg/dL (ref 0–149)
VLDL Cholesterol Cal: 15 mg/dL (ref 5–40)

## 2020-03-28 ENCOUNTER — Ambulatory Visit (HOSPITAL_COMMUNITY)
Admission: RE | Admit: 2020-03-28 | Discharge: 2020-03-28 | Disposition: A | Discharge status: Home / Self Care | Payer: Medicare Other | Source: Ambulatory Visit | Attending: Cardiology | Admitting: Cardiology

## 2020-03-28 ENCOUNTER — Other Ambulatory Visit: Payer: Self-pay

## 2020-03-28 DIAGNOSIS — M79605 Pain in left leg: Secondary | ICD-10-CM | POA: Diagnosis not present

## 2020-03-29 ENCOUNTER — Ambulatory Visit
Admission: RE | Admit: 2020-03-29 | Discharge: 2020-03-29 | Disposition: A | Discharge status: Home / Self Care | Payer: Medicare Other | Source: Ambulatory Visit | Attending: Acute Care | Admitting: Acute Care

## 2020-03-29 DIAGNOSIS — Z87891 Personal history of nicotine dependence: Secondary | ICD-10-CM | POA: Diagnosis not present

## 2020-03-29 DIAGNOSIS — Z122 Encounter for screening for malignant neoplasm of respiratory organs: Secondary | ICD-10-CM

## 2020-03-29 DIAGNOSIS — F17210 Nicotine dependence, cigarettes, uncomplicated: Secondary | ICD-10-CM

## 2020-03-30 ENCOUNTER — Other Ambulatory Visit: Payer: Self-pay | Admitting: *Deleted

## 2020-03-30 DIAGNOSIS — Z87891 Personal history of nicotine dependence: Secondary | ICD-10-CM

## 2020-03-30 NOTE — Progress Notes (Signed)
Please call patient and let them  know their  low dose Ct was read as a Lung RADS 2: nodules that are benign in appearance and behavior with a very low likelihood of becoming a clinically active cancer due to size or lack of growth. Recommendation per radiology is for a repeat LDCT in 12 months. .Please let them  know we will order and schedule their  annual screening scan for 03/2021. Please let them  know there was notation of CAD on their  scan.  Please remind the patient  that this is a non-gated exam therefore degree or severity of disease  cannot be determined. Please have them  follow up with their PCP regarding potential risk factor modification, dietary therapy or pharmacologic therapy if clinically indicated. Pt.  is  currently on statin therapy. Please place order for annual  screening scan for  6/2022and fax results to PCP. Thanks so much.

## 2020-04-14 DIAGNOSIS — F411 Generalized anxiety disorder: Secondary | ICD-10-CM | POA: Diagnosis not present

## 2020-04-14 DIAGNOSIS — F319 Bipolar disorder, unspecified: Secondary | ICD-10-CM | POA: Diagnosis not present

## 2020-05-12 DIAGNOSIS — F411 Generalized anxiety disorder: Secondary | ICD-10-CM | POA: Diagnosis not present

## 2020-05-12 DIAGNOSIS — F319 Bipolar disorder, unspecified: Secondary | ICD-10-CM | POA: Diagnosis not present

## 2020-06-06 DIAGNOSIS — E890 Postprocedural hypothyroidism: Secondary | ICD-10-CM | POA: Diagnosis not present

## 2020-06-06 DIAGNOSIS — M810 Age-related osteoporosis without current pathological fracture: Secondary | ICD-10-CM | POA: Diagnosis not present

## 2020-06-06 DIAGNOSIS — J449 Chronic obstructive pulmonary disease, unspecified: Secondary | ICD-10-CM | POA: Diagnosis not present

## 2020-06-06 DIAGNOSIS — F3130 Bipolar disorder, current episode depressed, mild or moderate severity, unspecified: Secondary | ICD-10-CM | POA: Diagnosis not present

## 2020-06-06 DIAGNOSIS — E7800 Pure hypercholesterolemia, unspecified: Secondary | ICD-10-CM | POA: Diagnosis not present

## 2020-06-16 DIAGNOSIS — F411 Generalized anxiety disorder: Secondary | ICD-10-CM | POA: Diagnosis not present

## 2020-06-16 DIAGNOSIS — F319 Bipolar disorder, unspecified: Secondary | ICD-10-CM | POA: Diagnosis not present

## 2020-06-21 DIAGNOSIS — L218 Other seborrheic dermatitis: Secondary | ICD-10-CM | POA: Diagnosis not present

## 2020-06-21 DIAGNOSIS — L821 Other seborrheic keratosis: Secondary | ICD-10-CM | POA: Diagnosis not present

## 2020-06-21 DIAGNOSIS — L0101 Non-bullous impetigo: Secondary | ICD-10-CM | POA: Diagnosis not present

## 2020-06-21 DIAGNOSIS — L249 Irritant contact dermatitis, unspecified cause: Secondary | ICD-10-CM | POA: Diagnosis not present

## 2020-07-07 DIAGNOSIS — F319 Bipolar disorder, unspecified: Secondary | ICD-10-CM | POA: Diagnosis not present

## 2020-07-07 DIAGNOSIS — F411 Generalized anxiety disorder: Secondary | ICD-10-CM | POA: Diagnosis not present

## 2020-07-21 ENCOUNTER — Telehealth: Payer: Self-pay | Admitting: Pulmonary Disease

## 2020-07-21 NOTE — Telephone Encounter (Signed)
Tried calling pt x 2 and her line rings busy  She does not need a 6MW  She needs an ov and we can do qualifying walk during visit

## 2020-07-26 ENCOUNTER — Ambulatory Visit: Payer: Medicare Other

## 2020-07-28 DIAGNOSIS — F332 Major depressive disorder, recurrent severe without psychotic features: Secondary | ICD-10-CM | POA: Diagnosis not present

## 2020-07-28 DIAGNOSIS — F411 Generalized anxiety disorder: Secondary | ICD-10-CM | POA: Diagnosis not present

## 2020-08-11 ENCOUNTER — Ambulatory Visit (INDEPENDENT_AMBULATORY_CARE_PROVIDER_SITE_OTHER): Payer: Medicare Other | Admitting: Pulmonary Disease

## 2020-08-11 ENCOUNTER — Other Ambulatory Visit: Payer: Self-pay

## 2020-08-11 ENCOUNTER — Encounter: Payer: Self-pay | Admitting: Pulmonary Disease

## 2020-08-11 ENCOUNTER — Ambulatory Visit: Payer: Medicare Other

## 2020-08-11 VITALS — BP 128/74 | HR 92 | Temp 95.6°F | Ht 68.5 in | Wt 210.8 lb

## 2020-08-11 DIAGNOSIS — F319 Bipolar disorder, unspecified: Secondary | ICD-10-CM | POA: Diagnosis not present

## 2020-08-11 DIAGNOSIS — J432 Centrilobular emphysema: Secondary | ICD-10-CM | POA: Diagnosis not present

## 2020-08-11 DIAGNOSIS — J449 Chronic obstructive pulmonary disease, unspecified: Secondary | ICD-10-CM

## 2020-08-11 DIAGNOSIS — G4734 Idiopathic sleep related nonobstructive alveolar hypoventilation: Secondary | ICD-10-CM | POA: Diagnosis not present

## 2020-08-11 DIAGNOSIS — Z87891 Personal history of nicotine dependence: Secondary | ICD-10-CM

## 2020-08-11 DIAGNOSIS — Z23 Encounter for immunization: Secondary | ICD-10-CM | POA: Diagnosis not present

## 2020-08-11 DIAGNOSIS — F411 Generalized anxiety disorder: Secondary | ICD-10-CM | POA: Diagnosis not present

## 2020-08-11 NOTE — Patient Instructions (Addendum)
Thank you for visiting Dr. Valeta Harms at [facility]. Today we recommend the following:  Orders Placed This Encounter  Procedures  . Pulse oximetry, overnight   Please let us know if you need refills before moving to [facility].  Please establish care with pulmonologist in [facility]. We are happy to fax records.   Return if symptoms worsen or fail to improve.    Please do your part to reduce the spread of COVID-19.

## 2020-08-11 NOTE — Progress Notes (Signed)
Synopsis: Referred in Feb 2020 for follow, patient of Dr. Lenna Gilford. PCP: Marda Stalker, PA-C  Subjective:   PATIENT ID: Lori Day GENDER: female DOB: 08-15-1946, MRN: [id number]  Chief Complaint  Patient presents with  . Follow-up    No complaints    PMH arthritis, bipolar disease, GERD, tongue cancer, currently on chantix, trying to quit smoking. She has been on chantix in the past off and on. Tongue cancer survivor 5 years. She was treated with prednisone for back pains. Concerned today about weight gain. She has follow up with psych as well as counselor in the coming weeks.  When asked the patient about her psychiatric history and her current ongoing symptoms.  She does admits to occasional hallucinations and seeing shadows within the room of what she thinks is people on occasion.  She also hears voices sometimes.  She says that she is used to the voices and none of the voices tell her to harm anyone or anything.  She said sometimes it sounds like it is static within her brain.  I encouraged her to follow-up with her psychiatrist regarding these ongoing symptoms.  Overall she had pulmonary function test completed in the past which were demonstrated mild COPD.  And she is well controlled on her current Stiolto regimen.  Otherwise her respiratory complaints are at baseline and she has had no additional symptoms.  Has never been enrolled in lung cancer screening program.  And she has been seen in pulmonary rehab in the past which she really enjoyed.  She does understand that she would like to get out this spring time and exercise more.  She spends most of her time sitting at home on the couch watching television or researching genealogy.  OV 02/16/20: doing well with her stiolto. She has albuterol but she never uses it. She has not been exercising too much. She has a new stationary bike. She stopped smoking. She did wonderful using chantix and welbutrin.  Overall, the patient's only  complaints are that she does have some dyspnea on exertion with climbing stairs.  Overall has been doing much better especially after quitting smoking.  She recently started college classes at [employer] after applying for a [employer].  She is taking [place] history, [place] and psychology.  She is doing this just to help keep her busy and her mind active.  She is overall really enjoying those in her retirement.  OV 08/11/20: she is doing well on stiolto. Needs recertification for nocturnal oxygen.  She needs a flu shot today.  She has no respiratory complaints.  She is planning to move at the end of this year to [place] to be closer to family.  Currently compliant with her medications.   Past Medical History:  Diagnosis Date  . Arthritis   . Bipolar 1 disorder ([place])   . Cancer of tongue ([place]) 09/19/2014   HPV infection, and ex smoker. She quit in 2013.   Marland Kitchen GERD (gastroesophageal reflux disease)   . Glaucoma   . Hiatal hernia   . History of HPV infection   . Hx of chronic sinusitis   . 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 Sciatica   . Sensory disturbance 09/19/2014  . Sensory hearing loss, bilateral   . Sjogren's syndrome ([place]) 09-02-2007  . 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 Thyroid nodule   . Tongue cancer ([place]) 02/02/2013   HPV/ radiation/chemo  Family History  Problem Relation Age of Onset  . COPD Father   . Thyroid cancer Father 70  . Pancreatic cancer Mother 68       no EtOH, not a smoker  . Ovarian cancer Sister 46       ovarian cancer survivor; BRIP1+  . Pancreatic cancer Maternal Grandmother   . Heart attack Maternal Grandfather 77  . Breast cancer Paternal Grandmother 73  . COPD Paternal Grandfather   . Emphysema Paternal Grandfather   . Prostate cancer Maternal Uncle   . Prostate cancer Maternal Uncle   . Breast cancer Cousin        maternal 1st cousin dx. early 22s; negative for familial BRIP1 mutation  . Asthma  Other      Past Surgical History:  Procedure Laterality Date  . ABDOMINAL HYSTERECTOMY    . APPENDECTOMY  1959  . BREAST BIOPSY     three times, twice on Left, once on Right, diagnosis as calcifications 1994-1999  . CATARACT EXTRACTION Bilateral 05-29-10, 06-14-10  . CHOLECYSTECTOMY    . KNEE SURGERY  08-15-11   arthoscopy  . tongue cancer    . TONSILECTOMY, ADENOIDECTOMY, BILATERAL MYRINGOTOMY AND TUBES  1955  . TUBAL LIGATION  1981  . turbinate surgery     Dr. Nicole Cella in [place].    Social History   Socioeconomic History  . Marital status: Divorced    Spouse name: Not on file  . Number of children: 2  . Years of education: 62  . Highest education level: Not on file  Occupational History  . Occupation: retired    Comment: criminal justice  Tobacco Use  . Smoking status: Former Smoker    Packs/day: 1.00    Years: 50.00    Pack years: 50.00    Types: Cigarettes    Quit date: 07/01/2018    Years since quitting: 2.1  . Smokeless tobacco: Never Used  . Tobacco comment: quit 07/01/18  Substance and Sexual Activity  . Alcohol use: Yes    Comment: craft beer 2 times week  . Drug use: No  . Sexual activity: Not on file  Other Topics Concern  . Not on file  Social History Narrative   Caffeine 2 cups daily, Single, 2 kids, retired.   Social Determinants of Health   Financial Resource Strain:   . Difficulty of Paying Living Expenses: Not on file  Food Insecurity:   . Worried About Charity fundraiser in the Last Year: Not on file  . Ran Out of Food in the Last Year: Not on file  Transportation Needs:   . Lack of Transportation (Medical): Not on file  . Lack of Transportation (Non-Medical): Not on file  Physical Activity:   . Days of Exercise per Week: Not on file  . Minutes of Exercise per Session: Not on file  Stress:   . Feeling of Stress : Not on file  Social Connections:   . Frequency of Communication with Friends and Family: Not on file  . Frequency of Social  Gatherings with Friends and Family: Not on file  . Attends Religious Services: Not on file  . Active Member of Clubs or Organizations: Not on file  . Attends Archivist Meetings: Not on file  . Marital Status: Not on file  Intimate Partner Violence:   . Fear of Current or Ex-Partner: Not on file  . Emotionally Abused: Not on file  . Physically Abused: Not on file  . Sexually Abused:  Not on file     Allergies  Allergen Reactions  . Aspirin Swelling  . Compazine [Prochlorperazine Edisylate]     Slurred speech   . Pseudophed-Chlophedianol-Gg     Side effects  . Seroquel [Quetiapine Fumarate] Swelling  . Latex Rash  . Tape Rash    adhesive     Outpatient Medications Prior to Visit  Medication Sig Dispense Refill  . Albuterol Sulfate (PROAIR RESPICLICK) 588 (90 Base) MCG/ACT AEPB Inhale 2 puffs into the lungs every 6 (six) hours as needed. 1 each 0  . b complex vitamins tablet Bid po OTC (Patient taking differently: 1 tablet daily. OTC)    . buPROPion (WELLBUTRIN XL) 150 MG 24 hr tablet TAKE 1 TABLET BY MOUTH EVERY MORNING FOR DEPRESSION    . Calcium Carb-Cholecalciferol (CALCIUM PLUS VITAMIN D3) 600-800 MG-UNIT TABS     . cetirizine (ZYRTEC) 10 MG tablet Take 10 mg by mouth daily as needed for allergies.    . Cholecalciferol (VITAMIN D3) 125 MCG (5000 UT) CAPS Take 5,000 Units by mouth daily.    . clobetasol (TEMOVATE) 0.05 % external solution APPLY TWICE DAILY TO SCALP AS NEEDED FOR ITCH    . Fluocinolone Acetonide Scalp 0.01 % OIL APPLY TO DAMP SCALP, LATHER LET SIT OVERNIGHT AND WASH OUT AM    . FLUoxetine (PROZAC) 20 MG capsule Take 20 mg by mouth.    Marland Kitchen GLUCOSA-CHONDR-NA CHONDR-MSM PO Take by mouth daily. W/ vit d    . ketoconazole (NIZORAL) 2 % shampoo WASH SCALP 1 2 X WEEK    . levothyroxine (SYNTHROID, LEVOTHROID) 75 MCG tablet Take 1 tablet (75 mcg total) by mouth daily before breakfast. 30 tablet 6  . omeprazole (PRILOSEC) 20 MG capsule Take 20 mg by mouth every  other day.     . OXYGEN Inhale 2 L into the lungs at bedtime.    Vladimir Faster Glycol-Propyl Glycol (SYSTANE OP) Apply to eye as needed.    . rosuvastatin (CRESTOR) 20 MG tablet Take 20 mg by mouth daily.    . Tiotropium Bromide-Olodaterol (STIOLTO RESPIMAT) 2.5-2.5 MCG/ACT AERS Inhale 2 puffs into the lungs daily. 12 g 11  . TURMERIC PO Take by mouth.    . valACYclovir (VALTREX) 1000 MG tablet Take 1,000 mg by mouth 2 (two) times daily. PRN    . Zoledronic Acid (RECLAST IV) Inject into the vein. 1 time per year    . rosuvastatin (CRESTOR) 20 MG tablet Take 1 tablet (20 mg total) by mouth daily. 90 tablet 3   No facility-administered medications prior to visit.    Review of Systems  Constitutional: Negative for chills, fever, malaise/fatigue and weight loss.  HENT: Negative for hearing loss, sore throat and tinnitus.   Eyes: Negative for blurred vision and double vision.  Respiratory: Negative for cough, hemoptysis, sputum production, shortness of breath, wheezing and stridor.   Cardiovascular: Negative for chest pain, palpitations, orthopnea, leg swelling and PND.  Gastrointestinal: Negative for abdominal pain, constipation, diarrhea, heartburn, nausea and vomiting.  Genitourinary: Negative for dysuria, hematuria and urgency.  Musculoskeletal: Negative for joint pain and myalgias.  Skin: Negative for itching and rash.  Neurological: Negative for dizziness, tingling, weakness and headaches.  Endo/Heme/Allergies: Negative for environmental allergies. Does not bruise/bleed easily.  Psychiatric/Behavioral: Negative for depression. The patient is not nervous/anxious and does not have insomnia.   All other systems reviewed and are negative.    Objective:  Physical Exam Vitals reviewed.  Constitutional:      General: She  is not in acute distress.    Appearance: She is well-developed.  HENT:     Head: Normocephalic and atraumatic.  Eyes:     General: No scleral icterus.     Conjunctiva/sclera: Conjunctivae normal.     Pupils: Pupils are equal, round, and reactive to light.  Neck:     Vascular: No JVD.     Trachea: No tracheal deviation.  Cardiovascular:     Rate and Rhythm: Normal rate and regular rhythm.     Heart sounds: Normal heart sounds. No murmur heard.   Pulmonary:     Effort: Pulmonary effort is normal. No tachypnea, accessory muscle usage or respiratory distress.     Breath sounds: Normal breath sounds. No stridor. No wheezing, rhonchi or rales.  Abdominal:     General: Bowel sounds are normal. There is no distension.     Palpations: Abdomen is soft.     Tenderness: There is no abdominal tenderness.  Musculoskeletal:        General: No tenderness.     Cervical back: Neck supple.  Lymphadenopathy:     Cervical: No cervical adenopathy.  Skin:    General: Skin is warm and dry.     Capillary Refill: Capillary refill takes less than 2 seconds.     Findings: No rash.  Neurological:     Mental Status: She is alert and oriented to person, place, and time.  Psychiatric:        Behavior: Behavior normal.      Vitals:   08/11/20 1445  BP: 128/74  Pulse: 92  Temp: (!) 95.6 F (35.3 C)  TempSrc: Other (Comment)  SpO2: 94%  Weight: 210 lb 12.8 oz (95.6 kg)  Height: 5' 8.5" (1.74 m)   94% on RA BMI Readings from Last 3 Encounters:  08/11/20 31.59 kg/m  03/13/20 31.63 kg/m  02/16/20 31.29 kg/m   Wt Readings from Last 3 Encounters:  08/11/20 210 lb 12.8 oz (95.6 kg)  03/13/20 208 lb (94.3 kg)  02/16/20 208 lb 12.8 oz (94.7 kg)    CBC No results found for: WBC, RBC, HGB, HCT, PLT, MCV, MCH, MCHC, RDW, LYMPHSABS, MONOABS, EOSABS, BASOSABS  Chest Imaging: Patient has not had any prior axial CT imaging of the chest  Chest x-ray 06/26/2017: Evidence of COPD.  No infiltrate The patient's images have been independently reviewed by me.    Pulmonary Functions Testing Results: PFT Results Latest Ref Rng & Units 03/16/2015  FVC-Pre L  3.26  FVC-Predicted Pre % 89  FVC-Post L 3.57  FVC-Predicted Post % 98  Pre FEV1/FVC % % 61  Post FEV1/FCV % % 64  FEV1-Pre L 1.98  FEV1-Predicted Pre % 71  FEV1-Post L 2.30  DLCO uncorrected ml/min/mmHg 18.13  DLCO UNC% % 59  DLVA Predicted % 60  TLC L 5.95  TLC % Predicted % 103  RV % Predicted % 107     FeNO: None   Pathology: None  Echocardiogram: None   Heart Catheterization: None     Assessment & Plan:   Stage 1 mild COPD by GOLD classification (HCC)  Nocturnal hypoxemia - Plan: Pulse oximetry, overnight  Former smoker  Centrilobular emphysema (HCC)  Needs flu shot   Discussion:  74 year old female history of tobacco abuse, former smoker.  Centrilobular emphysema, mild COPD managed with Stiolto.  Currently enrolled in our lung cancer screening program.  She has nocturnal hypoxemia on 2 L nasal cannula  Plan: Continue annual CT imaging with lung cancer screening.  She will need to establish care with pulmonary and [place] when she moves at the end of this year. Continue Stiolto today Continue albuterol as needed for shortness of breath and wheezing. Continue vitamin D supplementation. Pulse oximetry ordered on room air. We will need to likely repeat on 2 L nasal cannula again if she qualifies for O2.     Current Outpatient Medications:  .  Albuterol Sulfate (PROAIR RESPICLICK) 627 (90 Base) MCG/ACT AEPB, Inhale 2 puffs into the lungs every 6 (six) hours as needed., Disp: 1 each, Rfl: 0 .  b complex vitamins tablet, Bid po OTC (Patient taking differently: 1 tablet daily. OTC), Disp: , Rfl:  .  buPROPion (WELLBUTRIN XL) 150 MG 24 hr tablet, TAKE 1 TABLET BY MOUTH EVERY MORNING FOR DEPRESSION, Disp: , Rfl:  .  Calcium Carb-Cholecalciferol (CALCIUM PLUS VITAMIN D3) 600-800 MG-UNIT TABS, , Disp: , Rfl:  .  cetirizine (ZYRTEC) 10 MG tablet, Take 10 mg by mouth daily as needed for allergies., Disp: , Rfl:  .  Cholecalciferol (VITAMIN D3) 125 MCG (5000  UT) CAPS, Take 5,000 Units by mouth daily., Disp: , Rfl:  .  clobetasol (TEMOVATE) 0.05 % external solution, APPLY TWICE DAILY TO SCALP AS NEEDED FOR ITCH, Disp: , Rfl:  .  Fluocinolone Acetonide Scalp 0.01 % OIL, APPLY TO DAMP SCALP, LATHER LET SIT OVERNIGHT AND [place] OUT AM, Disp: , Rfl:  .  FLUoxetine (PROZAC) 20 MG capsule, Take 20 mg by mouth., Disp: , Rfl:  .  GLUCOSA-CHONDR-NA CHONDR-MSM PO, Take by mouth daily. W/ vit d, Disp: , Rfl:  .  ketoconazole (NIZORAL) 2 % shampoo, WASH SCALP 1 2 X WEEK, Disp: , Rfl:  .  levothyroxine (SYNTHROID, LEVOTHROID) 75 MCG tablet, Take 1 tablet (75 mcg total) by mouth daily before breakfast., Disp: 30 tablet, Rfl: 6 .  omeprazole (PRILOSEC) 20 MG capsule, Take 20 mg by mouth every other day. , Disp: , Rfl:  .  OXYGEN, Inhale 2 L into the lungs at bedtime., Disp: , Rfl:  .  Polyethyl Glycol-Propyl Glycol (SYSTANE OP), Apply to eye as needed., Disp: , Rfl:  .  rosuvastatin (CRESTOR) 20 MG tablet, Take 20 mg by mouth daily., Disp: , Rfl:  .  Tiotropium Bromide-Olodaterol (STIOLTO RESPIMAT) 2.5-2.5 MCG/ACT AERS, Inhale 2 puffs into the lungs daily., Disp: 12 g, Rfl: 11 .  TURMERIC PO, Take by mouth., Disp: , Rfl:  .  valACYclovir (VALTREX) 1000 MG tablet, Take 1,000 mg by mouth 2 (two) times daily. PRN, Disp: , Rfl:  .  Zoledronic Acid (RECLAST IV), Inject into the vein. 1 time per year, Disp: , Rfl:  .  rosuvastatin (CRESTOR) 20 MG tablet, Take 1 tablet (20 mg total) by mouth daily., Disp: 90 tablet, Rfl: 3  I spent 25 minutes dedicated to the care of this patient on the date of this encounter to include pre-visit review of records, face-to-face time with the patient discussing conditions above, post visit ordering of testing, clinical documentation with the electronic health record, making appropriate referrals as documented, and communicating necessary findings to members of the patients care team.    Garner Nash, DO [facility] 08/11/2020 3:08 PM

## 2020-08-16 ENCOUNTER — Encounter: Payer: Self-pay | Admitting: Pulmonary Disease

## 2020-08-16 DIAGNOSIS — R0683 Snoring: Secondary | ICD-10-CM | POA: Diagnosis not present

## 2020-08-16 DIAGNOSIS — G4730 Sleep apnea, unspecified: Secondary | ICD-10-CM | POA: Diagnosis not present

## 2020-08-25 DIAGNOSIS — M810 Age-related osteoporosis without current pathological fracture: Secondary | ICD-10-CM | POA: Diagnosis not present

## 2020-08-25 DIAGNOSIS — E890 Postprocedural hypothyroidism: Secondary | ICD-10-CM | POA: Diagnosis not present

## 2020-08-25 DIAGNOSIS — J449 Chronic obstructive pulmonary disease, unspecified: Secondary | ICD-10-CM | POA: Diagnosis not present

## 2020-08-25 DIAGNOSIS — F3130 Bipolar disorder, current episode depressed, mild or moderate severity, unspecified: Secondary | ICD-10-CM | POA: Diagnosis not present

## 2020-08-25 DIAGNOSIS — E7800 Pure hypercholesterolemia, unspecified: Secondary | ICD-10-CM | POA: Diagnosis not present

## 2020-08-29 ENCOUNTER — Other Ambulatory Visit: Payer: Self-pay

## 2020-08-29 DIAGNOSIS — G4734 Idiopathic sleep related nonobstructive alveolar hypoventilation: Secondary | ICD-10-CM

## 2020-08-31 ENCOUNTER — Telehealth: Payer: Self-pay | Admitting: Pulmonary Disease

## 2020-08-31 DIAGNOSIS — Z23 Encounter for immunization: Secondary | ICD-10-CM | POA: Diagnosis not present

## 2020-08-31 NOTE — Telephone Encounter (Signed)
I believe the one that was completed was while on room air.  The repeat needs to be on 2L to prove she still needs Oxygen therapy at night and 2L will be enough.   Thanks  Garner Nash, DO [facility] 08/31/2020 6:34 PM

## 2020-08-31 NOTE — Telephone Encounter (Signed)
Called and spoke with pt who stated she has had an ONO performed already and was wanting to know if we had received the results. Pt said that she believed she had the ONO performed 08/17/20 but is not really sure.  Pt was wondering why she received a call from [facility] to have the ONO repeated. Dr. Valeta Harms, please advise if you have the results from pt's first ONO and if so, does pt really need to have this repeated.

## 2020-09-01 NOTE — Telephone Encounter (Signed)
Called and spoke with pt letting her know the info stated by Dr. Valeta Harms and she verbalized understanding. Nothing further needed.

## 2020-09-02 ENCOUNTER — Other Ambulatory Visit: Payer: Self-pay | Admitting: Cardiovascular Disease

## 2020-09-06 ENCOUNTER — Encounter: Payer: Self-pay | Admitting: Pulmonary Disease

## 2020-09-06 DIAGNOSIS — G4730 Sleep apnea, unspecified: Secondary | ICD-10-CM | POA: Diagnosis not present

## 2020-09-06 DIAGNOSIS — R0683 Snoring: Secondary | ICD-10-CM | POA: Diagnosis not present

## 2020-09-08 DIAGNOSIS — F319 Bipolar disorder, unspecified: Secondary | ICD-10-CM | POA: Diagnosis not present

## 2020-09-08 DIAGNOSIS — F411 Generalized anxiety disorder: Secondary | ICD-10-CM | POA: Diagnosis not present

## 2020-09-26 ENCOUNTER — Telehealth: Payer: Self-pay | Admitting: Pulmonary Disease

## 2020-09-26 NOTE — Telephone Encounter (Signed)
Called and spoke with pt. Pt stated she repeated ONO on 2L O2 on 11/10 and is checking to see what the results of the ONO are.  Checked Dr. Juline Patch papers to see if ONO results were in there and there were no results.  PCCs, is there any way you can help US obtain these results.

## 2020-09-26 NOTE — Telephone Encounter (Signed)
I have sent a message to [facility]

## 2020-09-27 NOTE — Telephone Encounter (Signed)
I have received this ONO I will give to [facility]

## 2020-09-27 NOTE — Telephone Encounter (Signed)
I put in Dr. Juline Patch box. Dr. Valeta Harms is back in office on 12/13

## 2020-10-10 DIAGNOSIS — F411 Generalized anxiety disorder: Secondary | ICD-10-CM | POA: Diagnosis not present

## 2020-10-10 DIAGNOSIS — F332 Major depressive disorder, recurrent severe without psychotic features: Secondary | ICD-10-CM | POA: Diagnosis not present

## 2020-10-13 DIAGNOSIS — F319 Bipolar disorder, unspecified: Secondary | ICD-10-CM | POA: Diagnosis not present

## 2020-10-13 DIAGNOSIS — F411 Generalized anxiety disorder: Secondary | ICD-10-CM | POA: Diagnosis not present

## 2020-10-13 NOTE — Telephone Encounter (Signed)
Some one will need to track down the paper. I am not sure. Garner Nash, DO [facility] 10/13/2020 11:57 AM

## 2020-10-13 NOTE — Telephone Encounter (Signed)
Dr. Valeta Harms, please advise on ONO results. Thanks.

## 2020-10-16 NOTE — Telephone Encounter (Signed)
Message sent to [facility]

## 2020-10-16 NOTE — Telephone Encounter (Signed)
Triage, please advise if ONO is in look-at cubby. Thanks.

## 2020-10-16 NOTE — Telephone Encounter (Signed)
Will call [facility] in the morning 12/21.

## 2020-10-16 NOTE — Telephone Encounter (Signed)
I checked Dr. Juline Patch box in the Pod and there was no ONO in there on pt. Went up to Dr. Juline Patch office and did not see ONO results on pt up there.  We need to get results of ONO faxed to office again by [facility].  Vallarie Mare, is there any way you can help [place] out with this again please?

## 2020-10-17 NOTE — Telephone Encounter (Signed)
Called [facility] dept and spoke with Adonis Brook to see if she could refax pt's ONO results from 11/10 to office and she said she would.  Will leave encounter open while we await the fax.

## 2020-10-17 NOTE — Telephone Encounter (Signed)
ONO results have been received by [facility]. Have placed results in Dr. Juline Patch box. Routing encounter to Dr. Valeta Harms.

## 2020-10-23 NOTE — Telephone Encounter (Signed)
Called and spoke with pt letting her know the results of the ONO and she verbalized understanding. Nothing further needed.

## 2020-10-23 NOTE — Telephone Encounter (Signed)
PCCM:  Reviewed ONO. Good to remain on 2L [place].   Thanks,  Garner Nash, DO [facility] 10/23/2020 8:41 AM

## 2020-10-26 DIAGNOSIS — M810 Age-related osteoporosis without current pathological fracture: Secondary | ICD-10-CM | POA: Diagnosis not present

## 2020-10-26 DIAGNOSIS — J449 Chronic obstructive pulmonary disease, unspecified: Secondary | ICD-10-CM | POA: Diagnosis not present

## 2020-10-26 DIAGNOSIS — E890 Postprocedural hypothyroidism: Secondary | ICD-10-CM | POA: Diagnosis not present

## 2020-10-26 DIAGNOSIS — F3130 Bipolar disorder, current episode depressed, mild or moderate severity, unspecified: Secondary | ICD-10-CM | POA: Diagnosis not present

## 2020-10-26 DIAGNOSIS — K219 Gastro-esophageal reflux disease without esophagitis: Secondary | ICD-10-CM | POA: Diagnosis not present

## 2020-10-26 DIAGNOSIS — E7800 Pure hypercholesterolemia, unspecified: Secondary | ICD-10-CM | POA: Diagnosis not present

## 2020-10-31 DIAGNOSIS — J449 Chronic obstructive pulmonary disease, unspecified: Secondary | ICD-10-CM | POA: Diagnosis not present

## 2020-10-31 DIAGNOSIS — K219 Gastro-esophageal reflux disease without esophagitis: Secondary | ICD-10-CM | POA: Diagnosis not present

## 2020-10-31 DIAGNOSIS — F3130 Bipolar disorder, current episode depressed, mild or moderate severity, unspecified: Secondary | ICD-10-CM | POA: Diagnosis not present

## 2020-10-31 DIAGNOSIS — I700 Atherosclerosis of aorta: Secondary | ICD-10-CM | POA: Diagnosis not present

## 2020-10-31 DIAGNOSIS — M810 Age-related osteoporosis without current pathological fracture: Secondary | ICD-10-CM | POA: Diagnosis not present

## 2020-10-31 DIAGNOSIS — E559 Vitamin D deficiency, unspecified: Secondary | ICD-10-CM | POA: Diagnosis not present

## 2020-10-31 DIAGNOSIS — E7800 Pure hypercholesterolemia, unspecified: Secondary | ICD-10-CM | POA: Diagnosis not present

## 2020-10-31 DIAGNOSIS — E890 Postprocedural hypothyroidism: Secondary | ICD-10-CM | POA: Diagnosis not present

## 2020-12-12 DIAGNOSIS — J449 Chronic obstructive pulmonary disease, unspecified: Secondary | ICD-10-CM | POA: Diagnosis not present

## 2020-12-12 DIAGNOSIS — E039 Hypothyroidism, unspecified: Secondary | ICD-10-CM | POA: Diagnosis not present

## 2020-12-12 DIAGNOSIS — M1712 Unilateral primary osteoarthritis, left knee: Secondary | ICD-10-CM | POA: Diagnosis not present

## 2020-12-12 DIAGNOSIS — M810 Age-related osteoporosis without current pathological fracture: Secondary | ICD-10-CM | POA: Diagnosis not present

## 2020-12-12 DIAGNOSIS — F3170 Bipolar disorder, currently in remission, most recent episode unspecified: Secondary | ICD-10-CM | POA: Diagnosis not present

## 2020-12-21 DIAGNOSIS — J439 Emphysema, unspecified: Secondary | ICD-10-CM | POA: Diagnosis not present

## 2020-12-21 DIAGNOSIS — Z86010 Personal history of colonic polyps: Secondary | ICD-10-CM | POA: Diagnosis not present

## 2020-12-21 DIAGNOSIS — R1312 Dysphagia, oropharyngeal phase: Secondary | ICD-10-CM | POA: Diagnosis not present

## 2020-12-21 DIAGNOSIS — K219 Gastro-esophageal reflux disease without esophagitis: Secondary | ICD-10-CM | POA: Diagnosis not present

## 2020-12-21 DIAGNOSIS — Z85819 Personal history of malignant neoplasm of unspecified site of lip, oral cavity, and pharynx: Secondary | ICD-10-CM | POA: Diagnosis not present

## 2021-01-03 DIAGNOSIS — F332 Major depressive disorder, recurrent severe without psychotic features: Secondary | ICD-10-CM | POA: Diagnosis not present

## 2021-01-03 DIAGNOSIS — F411 Generalized anxiety disorder: Secondary | ICD-10-CM | POA: Diagnosis not present

## 2021-08-11 ENCOUNTER — Other Ambulatory Visit: Payer: Self-pay | Admitting: Cardiovascular Disease
